# Patient Record
Sex: Male | Born: 1955
Health system: Southern US, Community
[De-identification: ages and names within clinical notes are randomized; demographics above are authoritative.]

## PROBLEM LIST (undated history)

## (undated) DIAGNOSIS — K219 Gastro-esophageal reflux disease without esophagitis: Secondary | ICD-10-CM

## (undated) DIAGNOSIS — I493 Ventricular premature depolarization: Secondary | ICD-10-CM

## (undated) DIAGNOSIS — K4090 Unilateral inguinal hernia, without obstruction or gangrene, not specified as recurrent: Secondary | ICD-10-CM

## (undated) DIAGNOSIS — K760 Fatty (change of) liver, not elsewhere classified: Secondary | ICD-10-CM

## (undated) DIAGNOSIS — K635 Polyp of colon: Secondary | ICD-10-CM

## (undated) DIAGNOSIS — G8929 Other chronic pain: Secondary | ICD-10-CM

## (undated) DIAGNOSIS — D126 Benign neoplasm of colon, unspecified: Secondary | ICD-10-CM

## (undated) DIAGNOSIS — R1031 Right lower quadrant pain: Secondary | ICD-10-CM

## (undated) DIAGNOSIS — F411 Generalized anxiety disorder: Secondary | ICD-10-CM

## (undated) DIAGNOSIS — R001 Bradycardia, unspecified: Secondary | ICD-10-CM

## (undated) DIAGNOSIS — I2510 Atherosclerotic heart disease of native coronary artery without angina pectoris: Secondary | ICD-10-CM

## (undated) DIAGNOSIS — E7800 Pure hypercholesterolemia, unspecified: Secondary | ICD-10-CM

## (undated) DIAGNOSIS — K740 Hepatic fibrosis: Secondary | ICD-10-CM

## (undated) DIAGNOSIS — I10 Essential (primary) hypertension: Secondary | ICD-10-CM

## (undated) DIAGNOSIS — K648 Other hemorrhoids: Secondary | ICD-10-CM

## (undated) HISTORY — PX: COLONOSCOPY: SHX174

## (undated) HISTORY — DX: Atherosclerotic heart disease of native coronary artery without angina pectoris: I25.10

## (undated) HISTORY — DX: Polyp of colon: K63.5

## (undated) HISTORY — DX: Other hemorrhoids: K64.8

## (undated) HISTORY — DX: Bradycardia, unspecified: R00.1

## (undated) HISTORY — DX: Pure hypercholesterolemia, unspecified: E78.00

## (undated) HISTORY — PX: TONSILLECTOMY: SUR1361

## (undated) HISTORY — DX: Unilateral inguinal hernia, without obstruction or gangrene, not specified as recurrent: K40.90

## (undated) HISTORY — DX: Gastro-esophageal reflux disease without esophagitis: K21.9

## (undated) HISTORY — DX: Ventricular premature depolarization: I49.3

## (undated) HISTORY — DX: Generalized anxiety disorder: F41.1

## (undated) HISTORY — DX: Hepatic fibrosis: K74.0

## (undated) HISTORY — DX: Essential (primary) hypertension: I10

## (undated) HISTORY — DX: Other chronic pain: G89.29

## (undated) HISTORY — DX: Fatty (change of) liver, not elsewhere classified: K76.0

## (undated) HISTORY — DX: Right lower quadrant pain: R10.31

## (undated) HISTORY — DX: Benign neoplasm of colon, unspecified: D12.6

---

## 2000-11-30 ENCOUNTER — Encounter: Admission: RE | Admit: 2000-11-30 | Discharge: 2000-11-30 | Payer: Self-pay

## 2000-11-30 ENCOUNTER — Encounter: Payer: Self-pay | Admitting: General Surgery

## 2004-09-19 ENCOUNTER — Ambulatory Visit: Payer: Self-pay | Admitting: Internal Medicine

## 2004-12-23 ENCOUNTER — Ambulatory Visit: Payer: Self-pay | Admitting: Internal Medicine

## 2004-12-24 ENCOUNTER — Ambulatory Visit: Payer: Self-pay | Admitting: Cardiology

## 2005-02-02 ENCOUNTER — Emergency Department (HOSPITAL_COMMUNITY): Admission: EM | Admit: 2005-02-02 | Discharge: 2005-02-02 | Payer: Self-pay | Admitting: *Deleted

## 2005-05-29 ENCOUNTER — Ambulatory Visit: Payer: Self-pay | Admitting: Emergency Medicine

## 2005-08-19 ENCOUNTER — Ambulatory Visit: Payer: Self-pay | Admitting: Internal Medicine

## 2005-12-08 ENCOUNTER — Ambulatory Visit: Payer: Self-pay | Admitting: Internal Medicine

## 2006-02-19 ENCOUNTER — Ambulatory Visit: Payer: Self-pay | Admitting: Gastroenterology

## 2006-03-11 ENCOUNTER — Ambulatory Visit: Payer: Self-pay | Admitting: Internal Medicine

## 2007-03-02 ENCOUNTER — Ambulatory Visit: Payer: Self-pay | Admitting: Gastroenterology

## 2007-05-14 ENCOUNTER — Encounter: Payer: Self-pay | Admitting: Internal Medicine

## 2007-05-30 ENCOUNTER — Telehealth (INDEPENDENT_AMBULATORY_CARE_PROVIDER_SITE_OTHER): Payer: Self-pay | Admitting: *Deleted

## 2007-06-09 ENCOUNTER — Encounter: Payer: Self-pay | Admitting: Internal Medicine

## 2007-06-09 DIAGNOSIS — J309 Allergic rhinitis, unspecified: Secondary | ICD-10-CM | POA: Insufficient documentation

## 2007-06-09 DIAGNOSIS — I10 Essential (primary) hypertension: Secondary | ICD-10-CM | POA: Insufficient documentation

## 2007-06-17 ENCOUNTER — Telehealth (INDEPENDENT_AMBULATORY_CARE_PROVIDER_SITE_OTHER): Payer: Self-pay | Admitting: *Deleted

## 2007-06-20 ENCOUNTER — Ambulatory Visit: Payer: Self-pay | Admitting: Internal Medicine

## 2007-07-05 ENCOUNTER — Telehealth (INDEPENDENT_AMBULATORY_CARE_PROVIDER_SITE_OTHER): Payer: Self-pay | Admitting: *Deleted

## 2007-08-23 ENCOUNTER — Telehealth (INDEPENDENT_AMBULATORY_CARE_PROVIDER_SITE_OTHER): Payer: Self-pay | Admitting: *Deleted

## 2007-08-26 ENCOUNTER — Telehealth (INDEPENDENT_AMBULATORY_CARE_PROVIDER_SITE_OTHER): Payer: Self-pay | Admitting: *Deleted

## 2007-09-05 ENCOUNTER — Ambulatory Visit: Payer: Self-pay | Admitting: Gastroenterology

## 2007-09-19 ENCOUNTER — Encounter: Payer: Self-pay | Admitting: Gastroenterology

## 2007-09-19 ENCOUNTER — Ambulatory Visit: Payer: Self-pay | Admitting: Gastroenterology

## 2007-09-21 ENCOUNTER — Encounter: Payer: Self-pay | Admitting: Gastroenterology

## 2007-09-23 ENCOUNTER — Ambulatory Visit: Payer: Self-pay | Admitting: Internal Medicine

## 2007-09-23 DIAGNOSIS — E7800 Pure hypercholesterolemia, unspecified: Secondary | ICD-10-CM | POA: Insufficient documentation

## 2007-09-23 DIAGNOSIS — J984 Other disorders of lung: Secondary | ICD-10-CM | POA: Insufficient documentation

## 2007-10-04 ENCOUNTER — Ambulatory Visit: Payer: Self-pay | Admitting: Internal Medicine

## 2007-10-04 ENCOUNTER — Telehealth (INDEPENDENT_AMBULATORY_CARE_PROVIDER_SITE_OTHER): Payer: Self-pay | Admitting: *Deleted

## 2008-06-07 ENCOUNTER — Telehealth (INDEPENDENT_AMBULATORY_CARE_PROVIDER_SITE_OTHER): Payer: Self-pay | Admitting: *Deleted

## 2008-09-14 ENCOUNTER — Telehealth (INDEPENDENT_AMBULATORY_CARE_PROVIDER_SITE_OTHER): Payer: Self-pay | Admitting: *Deleted

## 2008-12-03 ENCOUNTER — Telehealth (INDEPENDENT_AMBULATORY_CARE_PROVIDER_SITE_OTHER): Payer: Self-pay | Admitting: *Deleted

## 2009-01-16 ENCOUNTER — Telehealth (INDEPENDENT_AMBULATORY_CARE_PROVIDER_SITE_OTHER): Payer: Self-pay | Admitting: *Deleted

## 2009-04-30 ENCOUNTER — Ambulatory Visit: Payer: Self-pay | Admitting: Internal Medicine

## 2009-04-30 DIAGNOSIS — R61 Generalized hyperhidrosis: Secondary | ICD-10-CM | POA: Insufficient documentation

## 2009-04-30 LAB — CONVERTED CEMR LAB
ALT: 21 units/L (ref 0–53)
AST: 24 units/L (ref 0–37)
Albumin: 3.9 g/dL (ref 3.5–5.2)
Alkaline Phosphatase: 54 units/L (ref 39–117)
BUN: 14 mg/dL (ref 6–23)
Basophils Absolute: 0 10*3/uL (ref 0.0–0.1)
Basophils Relative: 0.6 % (ref 0.0–3.0)
Bilirubin Urine: NEGATIVE
Bilirubin, Direct: 0.1 mg/dL (ref 0.0–0.3)
CO2: 30 meq/L (ref 19–32)
CRP, High Sensitivity: 2.2 (ref 0.00–5.00)
Calcium: 9 mg/dL (ref 8.4–10.5)
Chloride: 105 meq/L (ref 96–112)
Cholesterol: 211 mg/dL — ABNORMAL HIGH (ref 0–200)
Creatinine, Ser: 1.1 mg/dL (ref 0.4–1.5)
Direct LDL: 147.5 mg/dL
Eosinophils Absolute: 0.3 10*3/uL (ref 0.0–0.7)
Eosinophils Relative: 5.5 % — ABNORMAL HIGH (ref 0.0–5.0)
GFR calc non Af Amer: 74.69 mL/min (ref >60)
Glucose, Bld: 100 mg/dL — ABNORMAL HIGH (ref 70–99)
HCT: 46.9 % (ref 39.0–52.0)
HDL: 53.1 mg/dL (ref >39.00)
Hemoglobin, Urine: NEGATIVE
Hemoglobin: 15.5 g/dL (ref 13.0–17.0)
Ketones, ur: NEGATIVE mg/dL
Leukocytes, UA: NEGATIVE
Lymphocytes Relative: 24.6 % (ref 12.0–46.0)
Lymphs Abs: 1.3 10*3/uL (ref 0.7–4.0)
MCHC: 33.1 g/dL (ref 30.0–36.0)
MCV: 96 fL (ref 78.0–100.0)
Monocytes Absolute: 0.5 10*3/uL (ref 0.1–1.0)
Monocytes Relative: 10.6 % (ref 3.0–12.0)
Neutro Abs: 3 10*3/uL (ref 1.4–7.7)
Neutrophils Relative %: 58.7 % (ref 43.0–77.0)
Nitrite: NEGATIVE
Platelets: 214 10*3/uL (ref 150.0–400.0)
Potassium: 4.5 meq/L (ref 3.5–5.1)
RBC: 4.89 M/uL (ref 4.22–5.81)
RDW: 12.4 % (ref 11.5–14.6)
Sodium: 142 meq/L (ref 135–145)
Specific Gravity, Urine: 1.01 (ref 1.000–1.030)
TSH: 1.41 microintl units/mL (ref 0.35–5.50)
Total Bilirubin: 1 mg/dL (ref 0.3–1.2)
Total CHOL/HDL Ratio: 4
Total Protein, Urine: NEGATIVE mg/dL
Total Protein: 6.9 g/dL (ref 6.0–8.3)
Triglycerides: 80 mg/dL (ref 0.0–149.0)
Urine Glucose: NEGATIVE mg/dL
Urobilinogen, UA: 0.2 (ref 0.0–1.0)
VLDL: 16 mg/dL (ref 0.0–40.0)
WBC: 5.1 10*3/uL (ref 4.5–10.5)
pH: 7 (ref 5.0–8.0)

## 2009-05-06 ENCOUNTER — Telehealth: Payer: Self-pay | Admitting: Pulmonary Disease

## 2009-05-09 ENCOUNTER — Emergency Department (HOSPITAL_BASED_OUTPATIENT_CLINIC_OR_DEPARTMENT_OTHER): Admission: EM | Admit: 2009-05-09 | Discharge: 2009-05-09 | Payer: Self-pay | Admitting: Emergency Medicine

## 2009-05-09 ENCOUNTER — Ambulatory Visit: Payer: Self-pay | Admitting: Radiology

## 2009-07-15 ENCOUNTER — Ambulatory Visit: Payer: Self-pay | Admitting: Internal Medicine

## 2009-07-26 ENCOUNTER — Ambulatory Visit: Payer: Self-pay | Admitting: Internal Medicine

## 2009-07-26 DIAGNOSIS — M25569 Pain in unspecified knee: Secondary | ICD-10-CM | POA: Insufficient documentation

## 2009-08-26 ENCOUNTER — Telehealth (INDEPENDENT_AMBULATORY_CARE_PROVIDER_SITE_OTHER): Payer: Self-pay | Admitting: *Deleted

## 2009-10-09 HISTORY — PX: KNEE ARTHROSCOPY: SUR90

## 2010-01-06 ENCOUNTER — Telehealth (INDEPENDENT_AMBULATORY_CARE_PROVIDER_SITE_OTHER): Payer: Self-pay | Admitting: *Deleted

## 2010-03-26 ENCOUNTER — Ambulatory Visit: Payer: Self-pay | Admitting: Internal Medicine

## 2010-05-02 ENCOUNTER — Encounter: Payer: Self-pay | Admitting: Internal Medicine

## 2010-05-02 ENCOUNTER — Ambulatory Visit: Payer: Self-pay | Admitting: Internal Medicine

## 2010-05-02 LAB — CONVERTED CEMR LAB
ALT: 17 units/L (ref 0–53)
AST: 19 units/L (ref 0–37)
Albumin: 3.7 g/dL (ref 3.5–5.2)
Alkaline Phosphatase: 71 units/L (ref 39–117)
Basophils Relative: 0.4 % (ref 0.0–3.0)
Bilirubin, Direct: 0.1 mg/dL (ref 0.0–0.3)
Blood, UA: NEGATIVE
CO2: 28 meq/L (ref 19–32)
Calcium: 9.2 mg/dL (ref 8.4–10.5)
Eosinophils Relative: 4.2 % (ref 0.0–5.0)
Hemoglobin: 16 g/dL (ref 13.0–17.0)
Ketones, ur: NEGATIVE mg/dL
Lymphocytes Relative: 22.5 % (ref 12.0–46.0)
MCHC: 34.6 g/dL (ref 30.0–36.0)
Monocytes Relative: 7.7 % (ref 3.0–12.0)
Neutro Abs: 4.8 10*3/uL (ref 1.4–7.7)
RBC: 5.02 M/uL (ref 4.22–5.81)
Sodium: 139 meq/L (ref 135–145)
Total CHOL/HDL Ratio: 4
Total Protein, Urine: NEGATIVE mg/dL
Total Protein: 6.8 g/dL (ref 6.0–8.3)
Triglycerides: 71 mg/dL (ref 0.0–149.0)
Urine Glucose: NEGATIVE mg/dL
Urobilinogen, UA: 0.2 (ref 0.0–1.0)
VLDL: 14.2 mg/dL (ref 0.0–40.0)
WBC: 7.3 10*3/uL (ref 4.5–10.5)

## 2010-05-09 ENCOUNTER — Telehealth: Payer: Self-pay | Admitting: Internal Medicine

## 2010-05-11 HISTORY — PX: KNEE ARTHROSCOPY: SUR90

## 2010-06-08 LAB — CONVERTED CEMR LAB
ALT: 25 units/L (ref 0–53)
AST: 25 units/L (ref 0–37)
Albumin: 3.9 g/dL (ref 3.5–5.2)
Alkaline Phosphatase: 77 units/L (ref 39–117)
BUN: 10 mg/dL (ref 6–23)
Basophils Absolute: 0 10*3/uL (ref 0.0–0.1)
Basophils Relative: 0.4 % (ref 0.0–1.0)
Bilirubin, Direct: 0.1 mg/dL (ref 0.0–0.3)
CO2: 32 meq/L (ref 19–32)
Calcium: 9.4 mg/dL (ref 8.4–10.5)
Chloride: 103 meq/L (ref 96–112)
Cholesterol: 220 mg/dL (ref 0–200)
Creatinine, Ser: 1 mg/dL (ref 0.4–1.5)
Direct LDL: 153.5 mg/dL
Eosinophils Absolute: 0.4 10*3/uL (ref 0.0–0.7)
Eosinophils Relative: 7 % — ABNORMAL HIGH (ref 0.0–5.0)
GFR calc Af Amer: 102 mL/min
GFR calc non Af Amer: 84 mL/min
Glucose, Bld: 100 mg/dL — ABNORMAL HIGH (ref 70–99)
HCT: 48.1 % (ref 39.0–52.0)
HDL: 47.2 mg/dL (ref >39.0)
Hemoglobin: 16.2 g/dL (ref 13.0–17.0)
Lymphocytes Relative: 25.8 % (ref 12.0–46.0)
MCHC: 33.7 g/dL (ref 30.0–36.0)
MCV: 92.3 fL (ref 78.0–100.0)
Monocytes Absolute: 0.9 10*3/uL (ref 0.1–1.0)
Monocytes Relative: 16.2 % — ABNORMAL HIGH (ref 3.0–12.0)
Neutro Abs: 2.6 10*3/uL (ref 1.4–7.7)
Neutrophils Relative %: 50.6 % (ref 43.0–77.0)
Platelets: 211 10*3/uL (ref 150–400)
Potassium: 4.4 meq/L (ref 3.5–5.1)
RBC: 5.2 M/uL (ref 4.22–5.81)
RDW: 12.5 % (ref 11.5–14.6)
Sodium: 140 meq/L (ref 135–145)
TSH: 1.97 microintl units/mL (ref 0.35–5.50)
Total Bilirubin: 0.8 mg/dL (ref 0.3–1.2)
Total CHOL/HDL Ratio: 4.7
Total Protein: 7.2 g/dL (ref 6.0–8.3)
Triglycerides: 102 mg/dL (ref 0–149)
VLDL: 20 mg/dL (ref 0–40)
WBC: 5.3 10*3/uL (ref 4.5–10.5)

## 2010-06-10 NOTE — Assessment & Plan Note (Signed)
Summary: Acute NP office visit - edema left knee   Primary Provider/Referring Provider:  Sherene Sires  CC:  c/o swelling in the back of his right knee w/ some pain x54months and also numbness in the bottom of his left foot x2weeks.  History of Present Illness: 55 yowm  with minimum smoking history and documented chronic rhinitis with allergies to dust and mold cats with a recurrent pattern of nonspecific rhinitis triggered by viral upper respiratory infections or airplane travel.  05-02-09 cpx cc bad breath no particular time of the day or night and no definite correlation with nasal symptoms which flare with colds/ travel.  Not compliant with nasal steroids.  in terms of his hypertension and hyperlipidemia, he denies any exertional chest pain orthopnea PND TIA or claudication symptoms or leg swelling  July 15, 2009 cc head and chest congestion x 2 weeks mucus is yellow,  rx with ns on his own. denies chronic symptoms but records indicate many phone calls and non-adherence with nasal steroids. no sinus pain.    July 26, 2009 --Presents for acute office visit. Complains of c/o swelling in the back of his right knee w/ some pain x13months and also numbness in the bottom of his left foot x2weeks. Travels alot for work. After sitting for prolonged periods during travel, happened for last year. Feels tight and stiff behind knee, at time sore to bend knee. Feels puffy behind knee. No calf pain or swelling, no known injury. Denies chest pain, dyspnea, orthopnea, hemoptysis, fever, n/v/d, edema, headache.   Preventive Screening-Counseling & Management  Alcohol-Tobacco     Smoking Status: quit  Medications Prior to Update: 1)  Atenolol 50 Mg Tabs (Atenolol) .Marland Kitchen.. 1 Once Daily 2)  Viagra 50 Mg  Tabs (Sildenafil Citrate) .... As Needed 3)  Drysol 20 % Soln (Aluminum Chloride) .... Apply Daily As Needed 4)  Prednisone 10 Mg  Tabs (Prednisone) .... 4 Each Am X 2days, 2x2days, 1x2days and Stop 5)   Zithromax Z-Pak 250 Mg Tabs (Azithromycin) .... Take As Directed  Current Medications (verified): 1)  Atenolol 50 Mg Tabs (Atenolol) .Marland Kitchen.. 1 Once Daily 2)  Viagra 50 Mg  Tabs (Sildenafil Citrate) .... As Needed 3)  Drysol 20 % Soln (Aluminum Chloride) .... Apply Daily As Needed  Allergies (verified): No Known Drug Allergies  Past History:  Past Medical History: Last updated: 07/15/2009  HYPERCHOLESTEROLEMIA (ICD-272.0)    - Target LDL < 130 (Type A plus male gender) ALLERGIC RHINITIS, CHRONIC (ICD-477.9)    - Chronic rhinitis flyers reviewed 05/27/99, 05/16/02, 12/23/04 and 03/11/06    - Pt denied ever receiving a chronic rhinitis flyer July 15, 2009  HYPERTENSION (ICD-401.9) Colon Polyps     - See Colonoscopy 09/19/2007 ................................[facility]......................Marland KitchenWert     - Td 09/2007     - Pneumovax 05/2003 (second shot)     Family History: Last updated: May 02, 2009 asthma in his father died in [place] at 14 negative for cancer mother healthy older brother by 11 years and healthy  Social History: Last updated: 07/26/2009 Former smoker.  Quit in 2000, socially.   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  Quit in 2000, socially.   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 No Comments Medications reviewed with patient Daytime contact number verified with patient. Boone Master CNA  July 26, 2009 9:49 AM    Physical Exam  Additional Exam:  robust  but anxious ambulatory white male in no acute distress  Afeb with normal vital signs  wt 188. 190 April 30, 2009 > 190 July 15, 2009 >>186 July 26, 2009 HEENT: nl dentition, and orophanx.Mod bilateral nonspecific turbinate edema. Nl external ear canals without cough reflex Neck without JVD/Nodes/TM nl carotids without bruit  Lungs clear to A and P bilaterally without cough on insp or exp maneuvers RRR no s3 or murmur or increase in P2 no displacement pmi Abd soft and benign with nl excursion in the supine position. No bruits or organomegaly, specifically no aortic enlargement Ext warm without calf tenderness, cyanosis clubbing or edema, nl pulses neg homans sign, along right post knee w/ tenderness, ROM nml, no redness or bruit appreciated, pulses bilaterally intact , skin smooth, and warm.    Impression & Recommendations:  Problem # 1:  KNEE PAIN (ICD-719.46)  Posterior knee pain right > left supsect DJD from previous extensive sports (he played college football), he travels quite abit w/ prolonged sitting. I do not appreciate any swelling, neg homans sign, no evidence at this time to check for DVT Could be baker's cyst. Will tx for DJD w/ NSAIDs along w/ ice /heat if not improving will refer to ortho.  Please contact office for sooner follow up if symptoms do not improve or worsen   Orders: Est. Patient Level III ([address]11)  Complete Medication List: 1)  Atenolol 50 Mg Tabs (Atenolol) .Marland Kitchen.. 1 once daily 2)  Viagra 50 Mg Tabs (Sildenafil citrate) .... As needed 3)  Drysol 20 % Soln (Aluminum chloride) .... Apply daily as needed  Patient Instructions: 1)  Alternate ice and heat to knees as needed  2)  Avoid prolonged sitting, increase exercise/walking as tolerated.  3)   Ibuprofen 200mg  3 tabs two times a day for 5 days, take w/ food.  4)  if not improving after 2 weeks will refer to ortho to evaluate.  5)  Please contact office for sooner follow up if symptoms do not improve or worsen    Immunization History:  Influenza Immunization History:    Influenza:  historical (05/11/2009)

## 2010-06-10 NOTE — Progress Notes (Signed)
Summary: rx  Phone Note Call from Patient Call back at [phone]   Caller: Patient Call For: wert Reason for Call: Refill Medication, Talk to Nurse Summary of Call: [phone] - [facility] in [place]  - Pt needs refill on BP Meds & Viagra - Pt going out of town today and needs to pick meds up before he leaves. Initial call taken by: Eugene Gavia,  January 06, 2010 9:09 AM  Follow-up for Phone Call        Rxs were refilled- [facility] for pt to be made aware this was done. Follow-up by: Vernie Murders,  January 06, 2010 10:11 AM    Prescriptions: VIAGRA 50 MG  TABS (SILDENAFIL CITRATE) as needed  #10 x 3   Entered by:   Vernie Murders   Authorized by:   Nyoka Cowden MD   Signed by:   Vernie Murders on 01/06/2010   Method used:   Electronically to        [facility] [phone]* (retail)       [address]       [place], [place]  [address]       Ph: [phone]       Fax: [phone]   RxID:   [id number] ATENOLOL 50 MG TABS (ATENOLOL) 1 once daily  #30 x 3   Entered by:   Vernie Murders   Authorized by:   Nyoka Cowden MD   Signed by:   Vernie Murders on 01/06/2010   Method used:   Electronically to        [facility] [phone]* (retail)       [address]       [place], [place]  [address]       Ph: [phone]       Fax: [phone]   RxID:   [id number]

## 2010-06-10 NOTE — Assessment & Plan Note (Signed)
Summary: Primary svc/ acute ext ov re rx of Chronic Rhinitis   Primary Provider/Referring Provider:  Sherene Sires  CC:  Acute visit.  Marland Kitchen  History of Present Illness: 7 yowm  with minimum smoking history and documented chronic rhinitis with allergies to dust and mold cats with a recurrent pattern of nonspecific rhinitis triggered by viral upper respiratory infections or airplane travel.  April 30, 2009 cpx cc bad breath no particular time of the day or night and no definite correlation with nasal symptoms which flare with colds/ travel.  Not compliant with nasal steroids.  in terms of his hypertension and hyperlipidemia, he denies any exertional chest pain orthopnea PND TIA or claudication symptoms or leg swelling  July 15, 2009 cc head and chest congestion x 2 weeks mucus is yellow,  rx with ns on his own. denies chronic symptoms but records indicate many phone calls and non-adherence with nasal steroids. no sinus pain.  Pt denies any significant sore throat, dysphagia, itching, sneezing,  n  fever, chills, sweats, unintended wt loss, pleuritic or exertional cp, hempoptysis, change in activity tolerance  orthopnea pnd or leg swelling.  Pt also denies any obvious fluctuation in symptoms with weather or environmental change or other alleviating or aggravating factors but thinks the freq air travel may be the problem.     Current Medications (verified): 1)  Atenolol 50 Mg Tabs (Atenolol) .Marland Kitchen.. 1 Once Daily 2)  Viagra 50 Mg  Tabs (Sildenafil Citrate) .... As Needed 3)  Drysol 20 % Soln (Aluminum Chloride) .... Apply Daily As Needed  Allergies (verified): No Known Drug Allergies  Past History:  Past Medical History:  HYPERCHOLESTEROLEMIA (ICD-272.0)    - Target LDL < 130 (Type A plus male gender) ALLERGIC RHINITIS, CHRONIC (ICD-477.9)    - Chronic rhinitis flyers reviewed 05/27/99, 05/16/02, 12/23/04 and 03/11/06    - Pt denied ever receiving a chronic rhinitis flyer July 15, 2009  HYPERTENSION  (ICD-401.9) Colon Polyps     - See Colonoscopy 09/19/2007 ................................[facility]......................................................Marland KitchenWert     - Td 09/2007     - Pneumovax 05/2003 (second shot)     Vital Signs:  Patient profile:   55 year old male Weight:      190 pounds O2 Sat:      98 % on Room air Pulse rate:   67 / minute BP sitting:   118 / 80  (left arm)  Vitals Entered By: Vernie Murders (July 15, 2009 11:15 AM)  O2 Flow:  Room air  Physical Exam  Additional Exam:  robust  but anxious ambulatory white male in no acute distress and spent the entire interview with my nurse on his cell phone. Afeb with normal vital signs  wt 188. 190 April 30, 2009 > 190 July 15, 2009  HEENT: nl dentition, and orophanx.Mod bilateral nonspecific turbinate edema. Nl external ear canals without cough reflex Neck without JVD/Nodes/TM nl carotids without bruit  Lungs clear to A and P bilaterally without cough on insp or exp maneuvers RRR no s3 or murmur or increase in P2 no displacement pmi Abd soft and benign with nl excursion in the supine position. No bruits or organomegaly, specifically no aortic enlargement Ext warm without calf tenderness, cyanosis clubbing or edema, nl pulses    Impression & Recommendations:  Problem # 1:  ALLERGIC RHINITIS, CHRONIC (ICD-477.9)  Multiple ov and phone calls strongly suggest this is more of a chronic/recurrent issue than an intermittent one.  I had  an extended discussion with the patient today lasting 15 to 20 minutes of a 25 minute visit on the following issues:   He did not recognize the CRF he has received now 4 times so I did not give him another.  next flare consider a sinus ct then either ent or allergy referral.  In meantime can use advil cold and sinus as needed as long as not using daily.   Each maintenance medication was reviewed in detail including most importantly the difference between maintenance and  as needed and under what circumstances the prns are to be used. See instructions for specific recommendations   Orders: Est. Patient Level IV ([address])  Problem # 2:  HYPERTENSION (ICD-401.9) ok despite overuse of decongestants  His updated medication list for this problem includes:    Atenolol 50 Mg Tabs (Atenolol) .Marland Kitchen... 1 once daily  Medications Added to Medication List This Visit: 1)  Prednisone 10 Mg Tabs (Prednisone) .... 4 each am x 2days, 2x2days, 1x2days and stop 2)  Zithromax Z-pak 250 Mg Tabs (Azithromycin) .... Take as directed  Patient Instructions: 1)  Prednisone 10 mg 4 each am x 2days, 2x2days, 1x2days and stop 2)  Zpak 3)  Nasal Saline and advil cold and sinus are good short term will not control underlying inflammation so if this becomes chronic or frequent you'll a new maintenance regimen Prescriptions: ZITHROMAX Z-PAK 250 MG TABS (AZITHROMYCIN) take as directed  #1 x 0   Entered and Authorized by:   Nyoka Cowden MD   Signed by:   Abigail Miyamoto RN on 07/15/2009   Method used:   [facility]        [facility] [phone]* (retail)       [address]       [place], [place]  [address]       Ph: [phone]       Fax: [phone]   RxID:   [id number] PREDNISONE 10 MG  TABS (PREDNISONE) 4 each am x 2days, 2x2days, 1x2days and stop  #14 x 0   Entered and Authorized by:   Nyoka Cowden MD   Signed by:   Abigail Miyamoto RN on 07/15/2009   Method used:   [facility]ically to        [facility] [phone]* (retail)       [address]       [place], [place]  [address]       Ph: [phone]       Fax: [phone]   RxID:   [id number]

## 2010-06-10 NOTE — Progress Notes (Signed)
Summary: Wants referral  Phone Note Call from Patient Call back at [phone]   Caller: Patient Summary of Call: Saw Tammy Parrett on 3/18 for R knee--still stiff and achy.  Patient is limping.  Wants referral to specialist. Initial call taken by: Leonette Monarch,  August 26, 2009 8:54 AM  Follow-up for Phone Call        refer to ortho for knee pain Follow-up by: Rubye Oaks NP,  August 26, 2009 9:15 AM  Additional Follow-up for Phone Call Additional follow up Details #1::        The pt is aware and would like something this week on either Wed or Thurs, if possible. Will forward to the PCC's.Michel Bickers [facility]  August 26, 2009 9:25 AM    Additional Follow-up for Phone Call Additional follow up Details #2::    pt has appt to see the PA for dr [email or web address]  [facility]o and he is aware of this appt  Follow-up by: Oneita Jolly,  August 26, 2009 9:41 AM

## 2010-06-10 NOTE — Assessment & Plan Note (Signed)
Summary: Primary svc/ acute flare of chronic rhinitis   Primary Provider/Referring Provider:  Sherene Sires  CC:  Sinus pressure and aches..  History of Present Illness: 15 yowm  with minimum smoking history and documented chronic rhinitis with allergies to dust and mold cats with a recurrent pattern of nonspecific rhinitis triggered by viral upper respiratory infections or airplane travel.  April 30, 2009 cpx cc bad breath no particular time of the day or night and no definite correlation with nasal symptoms which flare with colds/ travel.  Not compliant with nasal steroids.  in terms of his hypertension and hyperlipidemia, he denies any exertional chest pain orthopnea PND TIA or claudication symptoms or leg swelling  July 15, 2009 cc head and chest congestion x 2 weeks mucus is yellow,  rx with ns on his own. denies chronic symptoms but records indicate many phone calls and non-adherence with nasal steroids. no sinus pain.    March 26, 2010 ov Sinus pressure and aches.x 2 days with sore throat, fever aches whihile in [facility] > flew to [place]. no sob or purulent nasal secretions. not compliant with nasal steroids. Pt denies any significant   dysphagia, itching, sneezing,  rigors,  sweats, unintended wt loss, pleuritic or exertional cp, hempoptysis, change in activity tolerance  orthopnea pnd or leg swelling.  Allergies: No Known Drug Allergies  Past History:  Past Medical History:  HYPERCHOLESTEROLEMIA (ICD-272.0)    - Target LDL < 130 (Type A plus male gender) ALLERGIC RHINITIS, CHRONIC (ICD-477.9)    - Chronic rhinitis flyers reviewed 05/27/99, 05/16/02, 12/23/04 and 03/11/06    - Pt denied ever receiving a chronic rhinitis flyer July 15, 2009, not using it March 26, 2010   HYPERTENSION (ICD-401.9) Colon Polyps     - See Colonoscopy 09/19/2007 ................................[facility]......................Marland KitchenWert     - Td 09/2007     -  Pneumovax 05/2003 (second shot)     Past Surgical History: Arthrosocpy R Kneee June 2011..........................Marland KitchenNorris  Vital Signs:  Patient profile:   55 year old male Weight:      187 pounds O2 Sat:      98 % on Room air Temp:     100.0 degrees F oral Pulse rate:   85 / minute BP sitting:   150 / 82  (left arm)  Vitals Entered By: Vernie Murders (March 26, 2010 3:13 PM)  O2 Flow:  Room air  Physical Exam  Additional Exam:  robust  but anxious ambulatory white male in no acute distress   wt 188. 190 April 30, 2009 > 190 July 15, 2009 >>186 July 26, 2009 > 187 March 26, 2010  HEENT: nl dentition, and orophanx.Mod bilateral nonspecific turbinate edema. Nl external ear canals without cough reflex Neck without JVD/Nodes/TM nl carotids without bruit  Lungs clear to A and P bilaterally without cough on insp or exp maneuvers RRR no s3 or murmur or increase in P2 no displacement pmi Abd soft and benign with nl excursion in the supine position. No bruits or organomegaly, specifically no aortic enlargement Ext warm without calf tenderness, cyanosis clubbing or edema, nl pulses     Impression & Recommendations:  Problem # 1:  ALLERGIC RHINITIS, CHRONIC (ICD-477.9) Multiple ov and phone calls strongly suggest this is more of a chronic/recurrent issue than an intermittent one.  He did not recognize the CRF he has received now 4 times on previous of so  I did not give him another.  next flare consider a  sinus ct then either ent or allergy referral.  In meantime can use advil cold and sinus as needed as long as not using daily.   Each maintenance medication was reviewed in detail including most importantly the difference between maintenance and as needed and under what circumstances the prns are to be used. See instructions for specific recommendations      Medications Added to Medication List This Visit: 1)  Nasonex 50 Mcg/act Susp (Mometasone furoate) .... Two puffs each  nostril daily 2)  Doxycycline Hyclate 100 Mg Tabs (Doxycycline hyclate) .... One twice daily with glass of water befoer eating  Other Orders: Est. Patient Level III ([address])  Patient Instructions: 1)  I emphasized that nasal steroids (nasonex)  have no immediate benefit in terms of improving symptoms.  To help them reached the target tissue, the patient should use Afrin two puffs every 12 hours applied one min before using the nasal steroids.  Afrin should be stopped after no more than 5 days.  If the symptoms worsen, Afrin can be restarted after 5 days off of therapy to prevent rebound congestion from overuse of Afrin.  I also emphasized that in no way are nasal steroids a concern in terms of "addiction". 2)  For aches pains and stuffiness use advil cold and sinus as needed 3)  doxycycline 100mg  twice daily x 7 days 4)    Prescriptions: DOXYCYCLINE HYCLATE 100 MG TABS (DOXYCYCLINE HYCLATE) one twice daily with glass of water befoer eating  #14 x 0   Entered and Authorized by:   Nyoka Cowden MD   Signed by:   Nyoka Cowden MD on 03/26/2010   Method used:   [facility] to        [facility] [phone]* (retail)       [address]       [place], [place]  [address]       Ph: [phone]       Fax: [phone]   RxID:   [id number] NASONEX 50 MCG/ACT  SUSP (MOMETASONE FUROATE) Two puffs each nostril daily  #1 x 11   Entered and Authorized by:   Nyoka Cowden MD   Signed by:   Nyoka Cowden MD on 03/26/2010   Method used:   Electronically to        [facility] [phone]* (retail)       [address]       [place], [place]  [address]       Ph: [phone]       Fax: [phone]   RxID:   [id number]

## 2010-06-12 NOTE — Assessment & Plan Note (Signed)
Summary: Primary Svc/ cpx   Primary Provider/Referring Provider:  Sherene Sires  CC:  cpx fasting.  History of Present Illness: 55 yowm  with minimum smoking history and documented chronic rhinitis with allergies to dust and mold cats with a recurrent pattern of nonspecific rhinitis triggered by viral upper respiratory infections or airplane travel not compliant with nasal steroids.   April 30, 2009 cpx cc bad breath no particular time of the day or night and no definite correlation with nasal symptoms which flare with colds/ travel.  Not compliant with nasal steroids.  in terms of his hypertension and hyperlipidemia, he denies any exertional chest pain orthopnea PND TIA or claudication symptoms or leg swelling  July 15, 2009 cc head and chest congestion x 2 weeks mucus is yellow,  rx with ns on his own. denies chronic symptoms but records indicate many phone calls and non-adherence with nasal steroids. no sinus pain.    March 26, 2010 ov Sinus pressure and aches.x 2 days with sore throat, fever aches while  in [employer] [place] > flew to [place] [place].  I emphasized that nasal steroids (nasonex)  have no immediate benefit in terms of improving symptoms.   For aches pains and stuffiness use advil cold and sinus as needed doxycycline 100mg  twice daily x 7 days  May 02, 2010 cpx cc pnds, again non compliant with nasal steroids. Pt denies any significant sore throat, dysphagia, itching, sneezing,  nasal congestion or excess secretions,  fever, chills, sweats, unintended wt loss, pleuritic or exertional cp, hempoptysis, change in activity tolerance  orthopnea pnd or leg swelling.   Current Medications (verified): 1)  Atenolol 50 Mg Tabs (Atenolol) .Marland Kitchen.. 1 Once Daily 2)  Viagra 50 Mg  Tabs (Sildenafil Citrate) .... As Needed 3)  Drysol 20 % Soln (Aluminum Chloride) .... Apply Daily As Needed 4)  Nasonex 50 Mcg/act  Susp (Mometasone Furoate) .... Two Puffs Each Nostril Daily As Needed  Allergies  (verified): No Known Drug Allergies  Past History:  Past Medical History:  HYPERCHOLESTEROLEMIA (ICD-272.0)    - Target LDL < 130 (Type A plus male gender) ALLERGIC RHINITIS, CHRONIC (ICD-477.9)    - Chronic rhinitis flyers reviewed 05/27/99, 05/16/02, 12/23/04 and 03/11/06    - Pt denied ever receiving a chronic rhinitis flyer July 15, 2009, not using it March 26, 2010   HYPERTENSION (ICD-401.9) Colon Polyps     - See Colonoscopy 09/19/2007 ................................[facility]......................Marland KitchenWert     - Td 09/2007     - Pneumovax 05/2003 (second shot)      - CPX  May 02, 2010   Family History: Reviewed history from 04/30/2009 and no changes required. asthma in his father died in [place] at 80 negative for cancer mother healthy older brother by 11 years and healthy  Social History: Reviewed history from 07/26/2009 and no changes required. Former smoker.  Quit in 2000, socially.   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 Nl external ear canals without cough reflex Neck without JVD/Nodes/TM nl carotids without bruit  Lungs clear to A and P bilaterally without cough on insp or exp maneuvers RRR no s3 or murmur or increase in P2 no displacement pmi Abd soft and benign with nl excursion in the supine position. No bruits or organomegaly,  specifically no aortic enlargement Ext warm without calf tenderness, cyanosis clubbing or edema, nl pulses Neuro alert, no deficits MS nl gait, no restrictions Skin no lesions GU  testes down bilaterally no IH Rectal: smooth prostate, no nodules, stool g neg      Cholesterol          [H]  221 mg/dL                   2-725     ATP III Classification            Desirable:  < 200 mg/dL                    Borderline High:  200 - 239 mg/dL               High:  > = 240 mg/dL   Triglycerides             71.0 mg/dL                  3.6-644.0     Normal:  <150 mg/dL     Borderline High:  347 - 199 mg/dL   HDL                       42.59 mg/dL                 >56.38   VLDL Cholesterol          14.2 mg/dL                  7.5-64.3  CHO/HDL Ratio:  CHD Risk                             4                    Men          Women     1/2 Average Risk     3.4          3.3     Average Risk          5.0          4.4     2X Average Risk          9.6          7.1     3X Average Risk          15.0          11.0                           Tests: (2) BMP (METABOL)   Sodium                    139 mEq/L  135-145   Potassium                 4.7 mEq/L                   3.5-5.1   Chloride                  105 mEq/L                   96-112   Carbon Dioxide            28 mEq/L                    19-32   Glucose                   95 mg/dL                    04-54   BUN                       17 mg/dL                    0-98   Creatinine                1.2 mg/dL                   1.1-9.1   Calcium                   9.2 mg/dL                   4.7-82.9   GFR                       66.65 mL/min                >60.00  Tests: (3) CBC Platelet w/Diff (CBCD)   White Cell Count          7.3 K/uL                    4.5-10.5   Red Cell Count            5.02 Mil/uL                 4.22-5.81   Hemoglobin                16.0 g/dL                   56.2-13.0   Hematocrit                46.3 %                       39.0-52.0   MCV                       92.4 fl                     78.0-100.0   MCHC                      34.6 g/dL                   86.5-78.4   RDW  12.8 %                      11.5-14.6   Platelet Count            252.0 K/uL                  150.0-400.0   Neutrophil %              65.2 %                      43.0-77.0   Lymphocyte %              22.5 %                      12.0-46.0   Monocyte %                7.7 %                       3.0-12.0   Eosinophils%              4.2 %                       0.0-5.0   Basophils %               0.4 %                       0.0-3.0   Neutrophill Absolute      4.8 K/uL                    1.4-7.7   Lymphocyte Absolute       1.7 K/uL                    0.7-4.0   Monocyte Absolute         0.6 K/uL                    0.1-1.0  Eosinophils, Absolute                             0.3 K/uL                    0.0-0.7   Basophils Absolute        0.0 K/uL                    0.0-0.1  Tests: (4) Hepatic/Liver Function Panel (HEPATIC)   Total Bilirubin           0.9 mg/dL                   4.6-9.6   Direct Bilirubin          0.1 mg/dL                   2.9-5.2   Alkaline Phosphatase      71 U/L                      39-117   AST                       19 U/L  0-37   ALT                       17 U/L                      0-53   Total Protein             6.8 g/dL                    1.1-9.1   Albumin                   3.7 g/dL                    4.7-8.2  Tests: (5) TSH (TSH)   FastTSH                   1.17 uIU/mL                 0.35-5.50  Tests: (6) UDip Only (UDIP)   Color                     YELLOW       RANGE:  Yellow;Lt. Yellow   Clarity                   CLEAR                       Clear   Specific Gravity          1.015                       1.000 - 1.030   Urine Ph                  7.0                         5.0-8.0   Protein                   NEGATIVE                    Negative   Urine Glucose              NEGATIVE                    Negative   Ketones                   NEGATIVE                    Negative   Urine Bilirubin           NEGATIVE                    Negative   Blood                     NEGATIVE                    Negative   Urobilinogen              0.2                         0.0 - 1.0   Leukocyte Esterace  NEGATIVE                    Negative   Nitrite                   NEGATIVE                    Negative  Tests: (7) Cholesterol LDL - Direct (DIRLDL)  Cholesterol LDL - Direct                             163.6 mg/dL  Impression & Recommendations:  Problem # 1:  ALLERGIC RHINITIS, CHRONIC (ICD-477.9)  His updated medication list for this problem includes:    Nasonex 50 Mcg/act Susp (Mometasone furoate) .Marland Kitchen... 2 every 12 hours taper to one at bedtime  exhaustive discussion again using the cream to the rash analogy.  Says did not know that nasonex = nasal prednsione.. See instructions for specific recommendations   Problem # 2:  HYPERCHOLESTEROLEMIA (ICD-272.0)    - Target LDL < 130 (Type A plus male gender)  Labs Reviewed: SGOT: 24 (04/30/2009)   SGPT: 21 (04/30/2009)   HDL:53.10 (04/30/2009), 47.2 (09/23/2007)  LDL:DEL (09/23/2007)  147 > 163 May 02, 2010 so work harder on diet/ ex  Chol:211 (04/30/2009), 220 (09/23/2007)  Trig:80.0 (04/30/2009), 102 (09/23/2007)  Medications Added to Medication List This Visit: 1)  Nasonex 50 Mcg/act Susp (Mometasone furoate) .... Two puffs each nostril daily as needed 2)  Nasonex 50 Mcg/act Susp (Mometasone furoate) .... 2 every 12 hours taper to one at bedtime  Other Orders: EKG w/ Interpretation (93000) TLB-Lipid Panel (80061-LIPID) TLB-BMP (Basic Metabolic Panel-BMET) (80048-METABOL) TLB-CBC Platelet - w/Differential (85025-CBCD) TLB-Hepatic/Liver Function Pnl (80076-HEPATIC) TLB-TSH (Thyroid Stimulating Hormone) (84443-TSH) TLB-Udip ONLY (81003-UDIP) Est. Patient 40-64 years (04540)  Patient Instructions: 1)   Nasal steroids (nasonex = nasal prednisone_ have no immediate benefit in terms of improving symptoms.  To help them reached the target tissue, the patient should use Afrin two puffs every 12 hours applied one min before using the nasal steroids.  Afrin should be stopped after no more than 5 days.  If the symptoms worsen, Afrin can be restarted after 5 days off of therapy to prevent rebound congestion from overuse of Afrin.  I also emphasized that in no way are nasal steroids a concern in terms of "addiction" and should be tapered down to one puff at bedtime BUT NOT STOPPED 2)  If not better, next step is sinus ct 3)  Exercise rec is 30 min 3 x weekly to level of short of breath, never out of breath 4)  Call [phone] for your results w/in next 3 days - if there's something important  I feel you need to know,  I'll be in touch with you directly.  5)

## 2010-06-12 NOTE — Progress Notes (Signed)
Summary: Test results  Phone Note Call from Patient Call back at [phone]   Caller: Patient Summary of Call: Patient had physical and has questions about lab results--what is acceptable range of cholesterol, why 163 is bad, etc.   Initial call taken by: Leonette Monarch,  May 09, 2010 8:46 AM  Follow-up for Phone Call        Pt has question about his labs and wanted to know his cholesterol ranges and what they meant. I explained this to the pt and gave him the ranges for his test.  Pt has no further question. Carron Curie CMA  May 09, 2010 9:20 AM

## 2010-07-07 ENCOUNTER — Encounter: Payer: Self-pay | Admitting: Adult Health

## 2010-07-07 ENCOUNTER — Ambulatory Visit (INDEPENDENT_AMBULATORY_CARE_PROVIDER_SITE_OTHER): Payer: 59 | Admitting: Adult Health

## 2010-07-07 ENCOUNTER — Telehealth (INDEPENDENT_AMBULATORY_CARE_PROVIDER_SITE_OTHER): Payer: Self-pay | Admitting: *Deleted

## 2010-07-07 DIAGNOSIS — J069 Acute upper respiratory infection, unspecified: Secondary | ICD-10-CM

## 2010-07-11 ENCOUNTER — Telehealth: Payer: Self-pay | Admitting: Adult Health

## 2010-07-17 NOTE — Progress Notes (Signed)
Summary: quantity on rx  Phone Note From Pharmacy Call back at [phone]   Caller: [facility] [address] #[id number]* Call For: parrett  Summary of Call: Need a quantity for hydromet pt is there now. Initial call taken by: Darletta Moll,  July 07, 2010 1:01 PM  Follow-up for Phone Call        qty in 6 ounces, [facility] aware Follow-up by: Philipp Deputy CMA,  July 07, 2010 1:21 PM

## 2010-07-17 NOTE — Assessment & Plan Note (Signed)
Summary: OV COUGHING, WHEEZING//SH   Visit Type:  Acute NP visit Primary Provider/Referring Provider:  Sherene Sires  CC:  Pt c/o wheezing worse when laying down, chest tightness, chest and head congestion, productive cough with gray to yellow mucus, chills x 5 days. Pt denies fever, bodyaches, n/v/d, sweats, SOB, and rash. Taking Mucinex and Robitussin DM and other OTC meds. Will be flying out in the AM to [place].  History of Present Illness: 16 yowm  with minimum smoking history and documented chronic rhinitis with allergies to dust and mold cats with a recurrent pattern of nonspecific rhinitis triggered by viral upper respiratory infections or airplane travel not compliant with nasal steroids.   April 30, 2009 cpx cc bad breath no particular time of the day or night and no definite correlation with nasal symptoms which flare with colds/ travel.  Not compliant with nasal steroids.  in terms of his hypertension and hyperlipidemia, he denies any exertional chest pain orthopnea PND TIA or claudication symptoms or leg swelling  July 15, 2009 cc head and chest congestion x 2 weeks mucus is yellow,  rx with ns on his own. denies chronic symptoms but records indicate many phone calls and non-adherence with nasal steroids. no sinus pain.    March 26, 2010 ov Sinus pressure and aches.x 2 days with sore throat, fever aches while  in [employer] > flew to [place].  I emphasized that nasal steroids (nasonex)  have no immediate benefit in terms of improving symptoms.   For aches pains and stuffiness use advil cold and sinus as needed doxycycline 100mg  twice daily x 7 days  May 02, 2010 cpx cc pnds, again non compliant with nasal steroids. Pt denies any significant sore throat, dysphagia, itching, sneezing,  nasal congestion or excess secretions,  fever, chills, sweats, unintended wt loss, pleuritic or exertional cp, hempoptysis, change in activity tolerance  orthopnea pnd or leg swelling.  July 07, 2010 --Presents for an acute office visit. Complains of wheezing worse when laying down, chest tightness, chest and head congestion, productive cough with gray to yellow mucus, chills x 5 days.   Taking Mucinex and Robitussin DM and other OTC meds. Will be flying out in the AM to [place]. Was around coworker recently with  a cold. Denies chest pain, dyspnea, orthopnea, hemoptysis, fever, n/v/d, edema, headache.    Preventive Screening-Counseling & Management  Alcohol-Tobacco     Smoking Status: quit  Medications Prior to Update: 1)  Atenolol 50 Mg Tabs (Atenolol) .Marland Kitchen.. 1 Once Daily 2)  Viagra 50 Mg  Tabs (Sildenafil Citrate) .... As Needed 3)  Drysol 20 % Soln (Aluminum Chloride) .... Apply Daily As Needed 4)  Nasonex 50 Mcg/act  Susp (Mometasone Furoate) .... 2 Every 12 Hours Taper To One At Bedtime  Current Medications (verified): 1)  Atenolol 50 Mg Tabs (Atenolol) .Marland Kitchen.. 1 Once Daily 2)  Viagra 50 Mg  Tabs (Sildenafil Citrate) .... As Needed 3)  Drysol 20 % Soln (Aluminum Chloride) .... Apply Daily As Needed 4)  Nasonex 50 Mcg/act  Susp (Mometasone Furoate) .... 2 Every 12 Hours Taper To One At Bedtime  Allergies (verified): No Known Drug Allergies  Past History:  Past Medical History: Last updated: 05/02/2010  HYPERCHOLESTEROLEMIA (ICD-272.0)    - Target LDL < 130 (Type A plus male gender) ALLERGIC RHINITIS, CHRONIC (ICD-477.9)    - Chronic rhinitis flyers reviewed 05/27/99, 05/16/02, 12/23/04 and 03/11/06    - Pt denied ever receiving a chronic rhinitis flyer July 15, 2009, not using it March 26, 2010   HYPERTENSION (ICD-401.9) Colon Polyps     - See Colonoscopy 09/19/2007 ................................[facility]......................Marland KitchenWert     - Td 09/2007     - Pneumovax 05/2003 (second shot)      - CPX  May 02, 2010   Past Surgical History: Last updated: 03/26/2010 Arthrosocpy R [place] June  2011..........................Marland KitchenNorris  Family History: Last updated: 05-23-2009 asthma in his father died in [place] at 100 negative for cancer mother healthy older brother by 11 years and healthy  Social History: Last updated: 07/26/2009 Former smoker.  Quit in 2000, socially.   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, freq flyer ETOH- daily, wine  Risk Factors: Smoking Status: quit (07/07/2010)  Review of Systems      See HPI  Vital Signs:  Patient profile:   55 year old male Height:      70 inches Weight:      185 pounds BMI:     26.64 O2 Sat:      96 % on Room air Temp:     97.5 degrees F oral Pulse rate:   66 / minute BP sitting:   126 / 90  (right arm) Cuff size:   regular  Vitals Entered By: Zackery Barefoot CMA (July 07, 2010 11:41 AM)  O2 Flow:  Room air CC: Pt c/o wheezing worse when laying down, chest tightness, chest and head congestion, productive cough with gray to yellow mucus, chills x 5 days. Pt denies fever, bodyaches, n/v/d, sweats, SOB, rash. Taking Mucinex and Robitussin DM and other OTC meds. Will be flying out in the AM to [place] Comments Medications reviewed with patient Verified contact number and pharmacy with patient Zackery Barefoot CMA  July 07, 2010 11:44 AM    Physical Exam  Additional Exam:  robust  but anxious ambulatory white male in no acute distress  HEENT: nl dentition, and orophanx.Mild bilateral nonspecific turbinate edema. Nl external ear canals without cough reflex Neck without JVD/Nodes/TM nl carotids without bruit  Lungs clear to A and P bilaterally without cough on insp or exp maneuvers RRR no s3 or murmur or increase in P2 no displacement pmi Abd soft and benign with nl excursion in the supine position. No bruits or organomegaly, specifically no aortic enlargement Ext warm without calf tenderness, cyanosis clubbing or edema, nl pulses Neuro alert, no deficits Skin no lesions    Impression & Recommendations:  Problem # 1:   UPPER RESPIRATORY INFECTION, ACUTE (ICD-465.9)  Zpack take as directed . Mucinex DM two times a day as needed cough/congestion  Saline nasal rinses as needed  Afrin 2 puffs two times a day x 5 days Fluids and rest  Hydromet 1-2 tsp every 4-6 hr as needed cough ,may make you sleepy.  Please contact office for sooner follow up if symptoms do not improve or worsen  His updated medication list for this problem includes:    Hydromet 5-1.5 Mg/65ml Syrp (Hydrocodone-homatropine) .Marland Kitchen... 1-2 tsp every 4-6 hrs as needed cough , may make you sleepy  Orders: Est. Patient Level III (04540)  Medications Added to Medication List This Visit: 1)  Zithromax Z-pak 250 Mg Tabs (Azithromycin) .... Take as directed 2)  Hydromet 5-1.5 Mg/24ml Syrp (Hydrocodone-homatropine) .Marland Kitchen.. 1-2 tsp every 4-6 hrs as needed cough , may make you sleepy  Patient Instructions: 1)  Zpack take as directed . 2)  Mucinex DM two times a day as needed cough/congestion  3)  Saline nasal rinses as needed  4)  Afrin 2 puffs two times a day x 5 days 5)  Fluids and rest  6)  Hydromet 1-2 tsp every 4-6 hr as needed cough ,may make you sleepy.  7)  Please contact office for sooner follow up if symptoms do not improve or worsen  Prescriptions: HYDROMET 5-1.5 MG/5ML SYRP (HYDROCODONE-HOMATROPINE) 1-2 tsp every 4-6 hrs as needed cough , may make you sleepy  #1 x 0   Entered and Authorized by:   Rubye Oaks NP   Signed by:   Tammy Parrett NP on 07/07/2010   Method used:   Print then Give to Patient   RxID:   [id number] ZITHROMAX Z-PAK 250 MG TABS (AZITHROMYCIN) take as directed  #1 x 0   Entered and Authorized by:   Rubye Oaks NP   Signed by:   Tammy Parrett NP on 07/07/2010   Method used:   Electronically to        [id number]* (retail)       [address]       [place], [place]  [address]       Ph: [phone]       Fax: [phone]   RxID:    [id number]    Immunization History:  Influenza Immunization History:    Influenza:  historical (03/11/2010)

## 2010-07-17 NOTE — Progress Notes (Signed)
Summary: chest congestion-  Phone Note Call from Patient Call back at [phone]   Caller: Patient Call For: parrett Summary of Call: Pt states he saw TP on 2/27 and his chest congestion is no better, currently taking mucinex, zpak, and cough syrup pls advise.//[facility] [address] Initial call taken by: Darletta Moll,  July 11, 2010 10:48 AM  Follow-up for Phone Call        [email or web address].  Carron Curie CMA  July 11, 2010 11:34 AM  spoke with pt and he states that he finished abx today and he does not see any improvement in chest and head congestion or productive cough with yellow phlegm. i advised that he should give the abx time to work, that zpak can stay in his system and keep working x 10 days after last dose. Pt states he is flying out of town on Monday and doesnt want to still be sik when he flys so he is asking for another round of ABx. please advise. Carron Curie CMA  July 11, 2010 12:37 PM    Additional Follow-up for Phone Call Additional follow up Details #1::        Prednisone taper over next week  fluids and rest  Please contact office for sooner follow up if symptoms do not improve or worsen  prednisone 10mg  4 tabs for 2 days, then 3 tabs for 2 days, 2 tabs for 2 days, then 1 tab for 2 days, then stop #20 , no refills  Additional Follow-up by: Rubye Oaks NP,  July 11, 2010 12:40 PM    Additional Follow-up for Phone Call Additional follow up Details #2::    pt aware of recs and rx sent.Carron Curie CMA  July 11, 2010 12:51 PM   New/Updated Medications: PREDNISONE 10 MG TABS (PREDNISONE) 4 tabs for 2 days, then 3 tabs for 2 days, 2 tabs for 2 days, then 1 tab for 2 days, then stop Prescriptions: PREDNISONE 10 MG TABS (PREDNISONE) 4 tabs for 2 days, then 3 tabs for 2 days, 2 tabs for 2 days, then 1 tab for 2 days, then stop  #20 x 0   Entered by:   Carron Curie CMA   Authorized by:   Rubye Oaks NP   Signed by:   Carron Curie CMA on  07/11/2010   Method used:   Electronically to        [facility] #[id number]* (retail)       [address]       [place], [place]  [address]       Ph: [phone]       Fax: [phone]   RxID:   [id number]

## 2010-07-29 ENCOUNTER — Telehealth: Payer: Self-pay | Admitting: Internal Medicine

## 2010-07-29 MED ORDER — ATENOLOL 50 MG PO TABS: 50.0000 mg | ORAL_TABLET | Freq: Two times a day (BID) | ORAL | Status: DC

## 2010-07-29 NOTE — Telephone Encounter (Signed)
Spoke with pt and advised that he needs to increase atenolol to 50 mg bid until next ov.  Last ov note however, does not state when he should return.  Please advise when he needs to come in thanks

## 2010-07-29 NOTE — Telephone Encounter (Signed)
Before rx for atenolol needs to be refilled, ok to see Tammy P for this

## 2010-07-29 NOTE — Telephone Encounter (Signed)
Spoke with pt and he states he went to the dentist yesterday and his BP was running 153/94. He states they checked it several times and it stayed the same. He denies any headaches or vision disturbances. He states he has not checked his BP in a while. At last OV with TP is was 126/90. Pt is currently taking Atenolol 50 mg daily. Please advise. Carron Curie, MA

## 2010-07-29 NOTE — Telephone Encounter (Signed)
Spoke with pt and sched appt with TP for 08/13/10 at noon and new rx for atenolol was sent to [facility] [address]

## 2010-07-29 NOTE — Telephone Encounter (Signed)
LMOMTCB

## 2010-07-29 NOTE — Telephone Encounter (Signed)
[email or web address]. Carron Curie, MA

## 2010-07-29 NOTE — Telephone Encounter (Signed)
Increase atenolol to 50 mg twice daily until next ov

## 2010-08-11 ENCOUNTER — Encounter: Payer: Self-pay | Admitting: Internal Medicine

## 2010-08-13 ENCOUNTER — Ambulatory Visit: Payer: 59 | Admitting: Adult Health

## 2010-08-22 ENCOUNTER — Other Ambulatory Visit: Payer: Self-pay | Admitting: Internal Medicine

## 2010-09-26 NOTE — Assessment & Plan Note (Signed)
[facility]                               PULMONARY OFFICE NOTE   Richard Roach, Richard Roach                             MRN:          [id number]  DATE:12/08/2005                            DOB:          03/17/1956    This is a primary service/acute office evaluation.   HISTORY:  A 55 year old white male with hypertension bothered by  intermittent rectal itching and burning for the last several weeks.  He  denies any significant rectal bleeding, change in bowel or bladder habits,  or abdominal pain.   PHYSICAL EXAMINATION:  GENERAL:  He is a pleasant, ambulatory white male in  no acute distress.  VITAL SIGNS:  Afebrile.  Blood pressure 136/84.  HEENT:  Unremarkable.  Pharynx is clear.  LUNGS:  Lung fields are completely clear bilaterally to auscultation and  percussion.  HEART:  Regular rhythm without murmur, rub or gallop.  ABDOMEN:  Soft, benign.  EXTREMITIES:  Warm without calf tenderness, clubbing, cyanosis or edema.  RECTAL:  Mild external hemorrhoids but no obvious rash.  Stool guaiac is  negative.  Prostate is normal.   IMPRESSION:  1.  Hypertension:  Well controlled on the present regimen, so I did not      change.  2.  Probable hemorrhoidal itching and burning:  Recommended Epsom salt baths      and Proctosol-HC cream applied at bedtime with followup by GI p.r.n.                                   Charlaine Dalton. Sherene Sires, MD, [facility]   MBW/MedQ  DD:  12/08/2005  DT:  12/08/2005  Job #:  [id number]

## 2010-09-26 NOTE — Assessment & Plan Note (Signed)
[facility]                               PULMONARY OFFICE NOTE   SANTEZ, WOODCOX                             MRN:          [id number]  DATE:03/11/2006                            DOB:          1956-04-28    PULMONARY/ACUTE OFFICE EVALUATION:   HISTORY:  A 55 year old white male, a former smoker with increasing symptoms  of nasal congestion and frequent throat clearing, especially when lying down  and early in the morning, now with new onset chest discomfort during  coughing paroxysms. He has minimal discolored sputum production.  No fevers,  chills, sweats, orthopnea, PND or leg swelling.   He is mainly here today because he feels his chest is swelling from an area  over the subxyphoid.  He has already tried Advil with no relief.   His only medication is Toprol 50 mg 1/2 daily.   PHYSICAL EXAMINATION:  He is a very anxious but pleasant ambulatory white  man in no acute distress.  He has stable vital signs.  HEENT:  Is remarkable for moderate __________ .  oropharynx clear with no  erythema.  No evidence of excessive post nasal drainage, yet he clears his  throat constantly during the exam.  NECK:  Supple without cervical adenopathy or tenderness.  Trachea is  midline.  No thyromegaly.  LUNGS:  Fields perfectly clear by auscultation and percussion.  There is  minimal tenderness over the subxiphoid.  There is a regular rate and rhythm in the apex area.  ABDOMEN: Soft and benign.  There is some minimal tenderness over the  epigastrium.  EXTREMITIES:  Warm without calf tenderness, cyanosis, clubbing, edema.   IMPRESSION:  1. Chronic rhinitis with acute exacerbation resulting in postnasal drip      syndrome, excessive coughing and throat clearing which resulted in      secondary chest wall discomfort from muscle spasm.  I doubt he      fractured a rib from coughing, although this is certainly a risk in      patients who develop a refractory cough  syndromes.  2. This is actually the 5th time we had the conversation with regarding      optimal treatment directed to rhinitis (frequent airplane traveler gets      sinus. Infections) calling in for antibiotics between office visits      for which Johnathan Hausen  has worked successfully in the past with no side      effects.  3. I did recommend that he be treated with another 5 days of Ketek, but      also reiterated optimal instructions directed at the underlying      problem, which is chronic nonspecific rhinitis with nasal steroids in      the form of Veramyst, Advil Cold & Sinus p.r.n., Robitussin DM p.r.n.      and tramadol 50 mg q.4 h. p.r.n. chest pain or excess coughing.  4. To treat the inflammatory component that is typically present with      upper airway syndromes I recommended prednisone but only  for 6 days and      a followup chest x-ray that pending at the time of dictation.   I spent almost 25 minutes with the patient going over the differential  diagnosis including the fact that it is very likely that chest wall  discomfort made worse by coughing in the anterior location, in this  particular patient it is extremely unlikely it is pneumonia or heart problem  and the fact that the pain can be reproduced easily with palpation over the  superficial muscles is strongly indicative of the mechanism outlined above.    ______________________________  Charlaine Dalton. Sherene Sires, MD, [facility]    MBW/MedQ  DD: 03/11/2006  DT: 03/11/2006  Job #: [id number]

## 2010-10-31 ENCOUNTER — Telehealth: Payer: Self-pay | Admitting: Internal Medicine

## 2010-10-31 NOTE — Telephone Encounter (Signed)
Called and spoke with pt.  Pt states he just had a question regarding his mother (who isn't an established pt here).  He states she is on 2 bp meds and her bp is 120/54.  Pt worried about pt being on 2 bp meds and also wanted to know if the 54 was too low of a bp.  Informed pt's son, that it isn't uncommon for patients to have to be on more than one bp med but recommended pt call his mother's pcp  who can better answer these questions for him.  Pt verbalized understanding.  Nothing further needed.

## 2010-11-07 ENCOUNTER — Other Ambulatory Visit: Payer: Self-pay | Admitting: Internal Medicine

## 2010-12-05 ENCOUNTER — Ambulatory Visit (INDEPENDENT_AMBULATORY_CARE_PROVIDER_SITE_OTHER): Payer: BC Managed Care – PPO | Admitting: Internal Medicine

## 2010-12-05 ENCOUNTER — Encounter: Payer: Self-pay | Admitting: Internal Medicine

## 2010-12-05 VITALS — BP 122/80 | HR 51 | Temp 97.4°F | Ht 70.0 in | Wt 178.8 lb

## 2010-12-05 DIAGNOSIS — J309 Allergic rhinitis, unspecified: Secondary | ICD-10-CM

## 2010-12-05 DIAGNOSIS — I10 Essential (primary) hypertension: Secondary | ICD-10-CM

## 2010-12-05 DIAGNOSIS — K4090 Unilateral inguinal hernia, without obstruction or gangrene, not specified as recurrent: Secondary | ICD-10-CM

## 2010-12-05 NOTE — Progress Notes (Signed)
Subjective:     Patient ID: Richard Roach, male   DOB: [birth year], 55 y.o.   MRN: [id number]  HPI     68 yowm with minimum smoking history and documented chronic rhinitis with allergies to dust and mold cats with a recurrent pattern of nonspecific rhinitis triggered by viral upper respiratory infections or airplane travel not compliant with nasal steroids.    July 15, 2009 cc head and chest congestion x 2 weeks mucus is yellow, rx with ns on his own. denies chronic symptoms but records indicate many phone calls and non-adherence with nasal steroids. no sinus pain.   March 26, 2010 ov Sinus pressure and aches.x 2 days with sore throat, fever aches while in [employer] > flew to [employer]. I emphasized that nasal steroids (nasonex) have no immediate benefit in terms of improving symptoms.   12/05/2010 ov/Wert cc RLQ pain similar to 8 years ago, occurred pulling heavy cart over 4th July, waxes and wanes worse bending no change in bowel/ bladder function, nausea.   Pt denies any significant sore throat, dysphagia, itching, sneezing,  nasal congestion or excess/ purulent secretions,  fever, chills, sweats, unintended wt loss, pleuritic or exertional cp, hempoptysis, orthopnea pnd or leg swelling.    Also denies any obvious fluctuation of symptoms with weather or environmental changes or other aggravating or alleviating factors.    Past Medical History:  HYPERCHOLESTEROLEMIA (ICD-272.0)  - Target LDL < 130 (Type A plus male gender)  ALLERGIC RHINITIS, CHRONIC (ICD-477.9)  - Chronic rhinitis flyers reviewed 05/27/99, 05/16/02, 12/23/04 and 03/11/06  - Pt denied ever receiving a chronic rhinitis flyer July 15, 2009, not using it March 26, 2010 - Chronic rhinitis flyers reviewed 05/27/99, 05/16/02, 12/23/04 and 03/11/06  - Pt denied ever receiving a chronic rhinitis flyer July 15, 2009, not using it March 26, 2010  HYPERTENSION (ICD-401.9)  Colon Polyps  - See Colonoscopy 09/19/2007  ................................[facility]  Health Maintenance......................................................Marland KitchenWert  - Td 09/2007  - Pneumovax 05/2003 (second shot)  - CPX May 02, 2010   Family History:  asthma in his father died in [place] at 35  negative for cancer  mother healthy  older brother by 11 years and healthy   Social History:  Former smoker. Quit in 2000, socially.  married  2 children  Sales, freq flyer  ETOH- daily, wine   Review of Systems     Objective:   Physical Exam Anxious wm nad   Wt 178 12/05/10 HEENT: nl dentition, turbinates, and orophanx. Nl external ear canals without cough reflex   NECK :  without JVD/Nodes/TM/ nl carotid upstrokes bilaterally   LUNGS: no acc muscle use, clear to A and P bilaterally without cough on insp or exp maneuvers   CV:  RRR  no s3 or murmur or increase in P2, no edema   ABD:  soft and nontender with nl excursion in the supine position. No bruits or organomegaly, bowel sounds nl  MS:  warm without deformities, calf tenderness, cyanosis or clubbing  SKIN: warm and dry without lesions    NEURO:  alert, approp, no deficits   GU minimal R IH bulge/ tenderness  easily reducible . No testicular mass, nl fem artery, no nodes    Assessment:         Plan:

## 2010-12-05 NOTE — Patient Instructions (Signed)
Aleve vs Advil with meals as needed  If pain resolves with rest and over the counter medications you are all set as long as you don't develop a signficant bulge which indicates possible intestinal protrusion into the canal which risks obstruction from getting kinked.   If not, you need referral to a general surgeon to consider a surgical repair of the inguinal canal

## 2010-12-06 ENCOUNTER — Encounter: Payer: Self-pay | Admitting: Internal Medicine

## 2010-12-06 DIAGNOSIS — R109 Unspecified abdominal pain: Secondary | ICD-10-CM | POA: Insufficient documentation

## 2010-12-06 NOTE — Assessment & Plan Note (Signed)
Adequate control on present rx, reviewed  

## 2010-12-06 NOTE — Assessment & Plan Note (Signed)
Continues to stop ics as soon as feels better despite repeat very strong recs to the contrary.  The best we can do is hope he'll restart these early with afrin x 5 days only at onset of flare, reviewed again today with pt in detail.

## 2010-12-06 NOTE — Assessment & Plan Note (Signed)
Minimal findings, no evidence of incarceration or strangulation, etc > rx conservatively with Surgery eval w/in one week if not improving

## 2010-12-07 ENCOUNTER — Emergency Department (HOSPITAL_BASED_OUTPATIENT_CLINIC_OR_DEPARTMENT_OTHER)
Admission: EM | Admit: 2010-12-07 | Discharge: 2010-12-07 | Disposition: A | Discharge status: Home / Self Care | Payer: BC Managed Care – PPO | Attending: Emergency Medicine | Admitting: Emergency Medicine

## 2010-12-07 ENCOUNTER — Encounter (HOSPITAL_BASED_OUTPATIENT_CLINIC_OR_DEPARTMENT_OTHER): Payer: Self-pay | Admitting: Emergency Medicine

## 2010-12-07 DIAGNOSIS — E7800 Pure hypercholesterolemia, unspecified: Secondary | ICD-10-CM | POA: Insufficient documentation

## 2010-12-07 DIAGNOSIS — K4090 Unilateral inguinal hernia, without obstruction or gangrene, not specified as recurrent: Secondary | ICD-10-CM | POA: Insufficient documentation

## 2010-12-07 DIAGNOSIS — I10 Essential (primary) hypertension: Secondary | ICD-10-CM | POA: Insufficient documentation

## 2010-12-07 DIAGNOSIS — R109 Unspecified abdominal pain: Secondary | ICD-10-CM | POA: Insufficient documentation

## 2010-12-07 LAB — URINALYSIS, ROUTINE W REFLEX MICROSCOPIC
Bilirubin Urine: NEGATIVE
Glucose, UA: NEGATIVE mg/dL
Hgb urine dipstick: NEGATIVE
Ketones, ur: NEGATIVE mg/dL
Leukocytes, UA: NEGATIVE
pH: 7 (ref 5.0–8.0)

## 2010-12-07 NOTE — ED Provider Notes (Signed)
History     Chief Complaint  Patient presents with  . Abdominal Pain    R groin pain   Patient is a 55 y.o. male presenting with abdominal pain. The history is provided by the patient.  Abdominal Pain The primary symptoms of the illness do not include abdominal pain, fever, nausea, vomiting, diarrhea or dysuria. Primary symptoms comment: pain to right inguinal area Episode onset: one month ago. The onset of the illness was gradual.  Associated with: started after pulling a heavy cart through the sand. Symptoms associated with the illness do not include constipation, urgency, hematuria, frequency or back pain.    Past Medical History  Diagnosis Date  . Hypercholesterolemia   . Allergic rhinitis   . Hypertension   . Colon polyps     Past Surgical History  Procedure Date  . Knee arthroscopy June 2011    right  . Knee arthroscopy w/ debridement     Family History  Problem Relation Age of Onset  . Asthma Father     History  Substance Use Topics  . Smoking status: Former Smoker    Types: Cigarettes    Quit date: 05/11/1998  . Smokeless tobacco: Not on file   Comment: smoked only socially x 5 yrs  . Alcohol Use: 1.8 oz/week    3 Glasses of wine per week     daily, wine      Review of Systems  Constitutional: Negative for fever and appetite change.  HENT: Negative.   Eyes: Negative.   Respiratory: Negative.   Cardiovascular: Negative.   Gastrointestinal: Negative for nausea, vomiting, abdominal pain, diarrhea, constipation and abdominal distention.  Genitourinary: Negative for dysuria, urgency, frequency, hematuria, scrotal swelling, difficulty urinating, penile pain and testicular pain.  Musculoskeletal: Negative.  Negative for back pain.  Skin: Negative.   Neurological: Negative.     Physical Exam  BP 141/78  Pulse 60  Temp(Src) 97.5 F (36.4 C) (Oral)  Resp 18  SpO2 99%  Physical Exam  Constitutional: He is oriented to person, place, and time. He  appears well-developed and well-nourished.  HENT:  Head: Normocephalic.  Eyes: Pupils are equal, round, and reactive to light.  Neck: Neck supple.  Cardiovascular: Normal rate, regular rhythm and normal heart sounds.   Pulmonary/Chest: Effort normal and breath sounds normal.  Abdominal: Soft. Bowel sounds are normal. There is no tenderness. There is no rebound and no guarding.  Genitourinary: Penis normal.       Some mild tenderness in right inguinal area, no definite hernia palpated.  On testicular pain or masses  Musculoskeletal: Normal range of motion.  Neurological: He is alert and oriented to person, place, and time.  Skin: Skin is warm and dry.    ED Course  Procedures  Results for orders placed during the hospital encounter of 12/07/10  URINALYSIS, ROUTINE W REFLEX MICROSCOPIC      Component Value Range   Color, Urine YELLOW  YELLOW    Appearance CLEAR  CLEAR    Specific Gravity, Urine 1.028  1.005 - 1.030    pH 7.0  5.0 - 8.0    Glucose, UA NEGATIVE  NEGATIVE (mg/dL)   Hgb urine dipstick NEGATIVE  NEGATIVE    Bilirubin Urine NEGATIVE  NEGATIVE    Ketones, ur NEGATIVE  NEGATIVE (mg/dL)   Protein, ur NEGATIVE  NEGATIVE (mg/dL)   Urobilinogen, UA 0.2  0.0 - 1.0 (mg/dL)   Nitrite NEGATIVE  NEGATIVE    Leukocytes, UA NEGATIVE  NEGATIVE  No results found.   MDM No pain over appendix or anywhere in abd.  No pain to testes or epididymus.  Feel like this is likely hernia.  Explained to him to return for any increased pain or knot that cannot be reduced.  He has been given f/u to surgeon by his PMD.  Will also give him name of surgeon on call.      Rolan Bucco, MD 12/07/10 [phone]

## 2010-12-07 NOTE — ED Notes (Signed)
MD at bedside.Care plan and follow up reviewed

## 2010-12-07 NOTE — ED Notes (Signed)
Pt reports lower RLQ pain after [place] on beach [place]

## 2010-12-07 NOTE — ED Notes (Signed)
Follow up with PMD reviewed RT diagnosis of hernia

## 2010-12-08 ENCOUNTER — Telehealth: Payer: Self-pay | Admitting: Internal Medicine

## 2010-12-08 DIAGNOSIS — K4090 Unilateral inguinal hernia, without obstruction or gangrene, not specified as recurrent: Secondary | ICD-10-CM

## 2010-12-08 NOTE — Telephone Encounter (Signed)
LMTCB

## 2010-12-08 NOTE — Telephone Encounter (Signed)
Called spoke with patient, advised of MW's recs.  Pt prefers to have the CT done first then see a surgeon if necessary.  Order for CT placed.  Pt prefers Thursday morning.  Will forward to MW to make him aware.

## 2010-12-08 NOTE — Telephone Encounter (Signed)
Pt hasnt heard anything. Wants to talk to nurse Henderson Cloud

## 2010-12-08 NOTE — Telephone Encounter (Signed)
Called and spoke with pt.  Pt states he saw MW on 7/27 for lower abd pain.  Pt states the pain has persisted and "gotten worse."  OTC pain meds not helping.  Pt is currently out of town on his way to [place].  Will return to [place] this Wednesday.  Pt is wanting to know if MW can order an MRI and also refer him to a Careers adviser.  Please advise.  Thanks.

## 2010-12-08 NOTE — Telephone Encounter (Signed)
Aware but the CT is not by itself an adequate eval, needs to go ahead and make the appt and cancel it if he feels better rather than wait until the end of the week to find him a doctor

## 2010-12-08 NOTE — Telephone Encounter (Signed)
Needs to be referred to surgery for R IH - if can't get him in this week then ok to do ct pelvis with contrast, not mri

## 2010-12-09 NOTE — Telephone Encounter (Signed)
Spoke to pt and he agrees to see general surgery order sent to pcc to schedule and call pt

## 2010-12-09 NOTE — Telephone Encounter (Signed)
[phone] pt return call. June Leap Lilly

## 2010-12-11 ENCOUNTER — Ambulatory Visit (INDEPENDENT_AMBULATORY_CARE_PROVIDER_SITE_OTHER)
Admission: RE | Admit: 2010-12-11 | Discharge: 2010-12-11 | Disposition: A | Discharge status: Home / Self Care | Payer: BC Managed Care – PPO | Source: Ambulatory Visit | Attending: Internal Medicine | Admitting: Internal Medicine

## 2010-12-11 ENCOUNTER — Ambulatory Visit (INDEPENDENT_AMBULATORY_CARE_PROVIDER_SITE_OTHER): Payer: BC Managed Care – PPO | Admitting: Surgery

## 2010-12-11 ENCOUNTER — Telehealth: Payer: Self-pay | Admitting: Internal Medicine

## 2010-12-11 DIAGNOSIS — N433 Hydrocele, unspecified: Secondary | ICD-10-CM

## 2010-12-11 DIAGNOSIS — K4090 Unilateral inguinal hernia, without obstruction or gangrene, not specified as recurrent: Secondary | ICD-10-CM

## 2010-12-11 MED ORDER — IOHEXOL 300 MG/ML  SOLN
80.0000 mL | Freq: Once | INTRAMUSCULAR | Status: AC | PRN
  Administered 2010-12-11: 80 mL via INTRAVENOUS

## 2010-12-11 NOTE — Telephone Encounter (Signed)
Spoke with pt and notified of results/recs per MW. Pt verbalized understanding and order was sent to [facility] for referral to urologist.

## 2010-12-11 NOTE — Telephone Encounter (Signed)
Pt calling again about his ct results.Richard Roach

## 2010-12-11 NOTE — Telephone Encounter (Signed)
Reviewed under results.

## 2010-12-11 NOTE — Telephone Encounter (Signed)
Dr Sherene Sires, please advise results of CT abd pelvis once reviewed, thanks!

## 2010-12-12 NOTE — Progress Notes (Signed)
Quick Note:  Spoke with pt and notified of results per Dr. Wert. Pt verbalized understanding and denied any questions.  ______ 

## 2010-12-13 ENCOUNTER — Telehealth: Payer: Self-pay | Admitting: Pulmonary Disease

## 2010-12-13 DIAGNOSIS — J069 Acute upper respiratory infection, unspecified: Secondary | ICD-10-CM

## 2010-12-13 MED ORDER — AZITHROMYCIN 250 MG PO TABS: ORAL_TABLET | ORAL | Status: AC

## 2010-12-13 NOTE — Telephone Encounter (Signed)
Pt calls with 3-4 day h/o worsening sinus and chest congestion, cough with discolored mucus.  Is concerned because he is getting ready to fly out of town in next day or so.  No f/c/s, no breathing issues.    Will call in zpak for him since he is going OOT, and asked him to use tylenol cold and sinus as needed.  To call us if not improving.

## 2010-12-16 ENCOUNTER — Encounter (INDEPENDENT_AMBULATORY_CARE_PROVIDER_SITE_OTHER): Payer: Self-pay | Admitting: Surgery

## 2010-12-16 ENCOUNTER — Ambulatory Visit (INDEPENDENT_AMBULATORY_CARE_PROVIDER_SITE_OTHER): Payer: BC Managed Care – PPO | Admitting: Surgery

## 2010-12-16 VITALS — BP 142/100 | HR 60 | Temp 97.0°F | Ht 70.0 in | Wt 178.8 lb

## 2010-12-16 DIAGNOSIS — K4090 Unilateral inguinal hernia, without obstruction or gangrene, not specified as recurrent: Secondary | ICD-10-CM

## 2010-12-16 NOTE — Patient Instructions (Signed)
I don't think you have a significant hernia. If you develop a bulge come back to see me. Youmay have normal activities and exercise as you wish

## 2010-12-16 NOTE — Progress Notes (Signed)
CC: Inguinal hernia HPI: The patient was doing some straining and pulling while he was at the beach and developed some discomfort in the right inguinal area. He saw his primary care physician and was thought to possibly have a hernia based on physical examination and some tenderness in the inguinal canal. A CT scan was done which showed a small left inguinal hernia containing only fat but no right inguinal hernia. The right side was the symptomatic side. He has never had any discomfort on the left side.  ROS: His 15 point ROS form was filled out and was essentially negative.  MEDS:Med list reviewed  ALLERGIES:List reviewed   PE General: The patient is alert oriented and generally healthy-appearing Abdomen: The abdomen is soft and benign. There are no masses or organomegaly. No abdominal hernias are noted. GU: Testes appear normal. He is tender along the right cord structures up into the inguinal canal. I did not feel a definite hernia on either side.  Data Reviewed I have looked over the nose from his primary care office as well as his CT report. He notes that he saw a urologist because the CT suggested the possibility of a right hydrocele but was told everything was "OK".  Assessment Possible small left inguinal hernia by CT, not symptomatic no apparent right inguinal hernia by physical exam.  Plan I told him that at this point I did not think surgery was indicated. He may be going to develop a hernia either on the right or the left side, but I told him that I didn't think it was helpful to restrict any of his normal activities or not exercise. If he does develop pain, or bulge that we will need to reevaluate his situation.

## 2010-12-23 ENCOUNTER — Encounter: Payer: Self-pay | Admitting: Internal Medicine

## 2010-12-26 ENCOUNTER — Encounter: Payer: Self-pay | Admitting: Adult Health

## 2010-12-26 ENCOUNTER — Ambulatory Visit (INDEPENDENT_AMBULATORY_CARE_PROVIDER_SITE_OTHER): Payer: BC Managed Care – PPO | Admitting: Adult Health

## 2010-12-26 DIAGNOSIS — J019 Acute sinusitis, unspecified: Secondary | ICD-10-CM

## 2010-12-26 MED ORDER — CEFDINIR 300 MG PO CAPS: 300.0000 mg | ORAL_CAPSULE | Freq: Two times a day (BID) | ORAL | Status: AC

## 2010-12-26 MED ORDER — MOMETASONE FUROATE 50 MCG/ACT NA SUSP: 2.0000 | Freq: Every day | NASAL | Status: DC

## 2010-12-26 MED ORDER — FEXOFENADINE HCL 180 MG PO TABS: 180.0000 mg | ORAL_TABLET | Freq: Every day | ORAL | Status: DC | PRN

## 2010-12-26 NOTE — Patient Instructions (Signed)
Omnicef 300mg  Twice daily  For 10 days with food, eat yogurt  Mucinex DM Twice daily  As needed  Cough/congestion  Saline nasal rinses As needed   Begin Allegra 180mg  daily (over the counter). Use after finish antibiotics for about 6 weeks then may use As needed  For sinus drainage, drippy nose.  Begin Nasonex 2 puffs Twice daily   May need to use above sinus regimen on more regular basis during "allergy season" Mid February- Mid May And Mid August -October.  May try Afrin 1 puff /nose prior to flight to see if this helps.  Please contact office for sooner follow up if symptoms do not improve or worsen or seek emergency care

## 2011-01-01 DIAGNOSIS — J019 Acute sinusitis, unspecified: Secondary | ICD-10-CM | POA: Insufficient documentation

## 2011-01-01 NOTE — Assessment & Plan Note (Signed)
Omnicef 300mg Twice daily  For 10 days with food, eat yogurt  Mucinex DM Twice daily  As needed  Cough/congestion  Saline nasal rinses As needed   Begin Allegra 180mg daily (over the counter). Use after finish antibiotics for about 6 weeks then may use As needed  For sinus drainage, drippy nose.  Begin Nasonex 2 puffs Twice daily   May need to use above sinus regimen on more regular basis during "allergy season" Mid February- Mid May And Mid August -October.  May try Afrin 1 puff /nose prior to flight to see if this helps.  Please contact office for sooner follow up if symptoms do not improve or worsen or seek emergency care    

## 2011-01-01 NOTE — Progress Notes (Signed)
Subjective:     Patient ID: Richard Roach, male   DOB: [birth year], 55 y.o.   MRN: [id number]  HPI     38 yowm with minimum smoking history and documented chronic rhinitis with allergies to dust and mold cats with a recurrent pattern of nonspecific rhinitis triggered by viral upper respiratory infections or airplane travel not compliant with nasal steroids.    July 15, 2009 cc head and chest congestion x 2 weeks mucus is yellow, rx with ns on his own. denies chronic symptoms but records indicate many phone calls and non-adherence with nasal steroids. no sinus pain.   March 26, 2010 ov Sinus pressure and aches.x 2 days with sore throat, fever aches while in [employer] > flew to [employer]. I emphasized that nasal steroids (nasonex) have no immediate benefit in terms of improving symptoms.   12/05/2010 ov/Wert cc RLQ pain similar to 8 years ago, occurred pulling heavy cart over 4th July, waxes and wanes worse bending no change in bowel/ bladder function, nausea.   12/26/10 Acute OV  Pt presents for an acute office visit. Complains sinus pressure/congestion with clear mucus, PND, chest congestion, dry cough, no energy x2weeks - finished zpak given 8.4.12. 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 Seems to get congested esp in the spring and fall with subsequent sinus infections.  OTC not helping.  No fever or chest pain .     Past Medical History:  HYPERCHOLESTEROLEMIA (ICD-272.0)  - Target LDL < 130 (Type A plus male gender)  ALLERGIC RHINITIS, CHRONIC (ICD-477.9)  - Chronic rhinitis flyers reviewed 05/27/99, 05/16/02, 12/23/04 and 03/11/06  - Pt denied ever receiving a chronic rhinitis flyer July 15, 2009, not using it March 26, 2010 - Chronic rhinitis flyers reviewed 05/27/99, 05/16/02, 12/23/04 and 03/11/06  - Pt denied ever receiving a chronic rhinitis flyer July 15, 2009, not using it March 26, 2010  HYPERTENSION (ICD-401.9)  Colon Polyps  - See Colonoscopy 09/19/2007  [facility]  Health Maintenance......................................................Marland KitchenWert  - Td 09/2007  - Pneumovax 05/2003 (second shot)  - CPX May 02, 2010   Family History:  asthma in his father died in [place] at 58  negative for cancer  mother healthy  older brother by 11 years and healthy   Social History:  Former smoker. Quit in 2000, socially.  married  2 children  Sales, freq flyer  ETOH- daily, wine   Review of Systems     Objective:   Physical Exam Anxious wm nad   Wt 178 12/05/10 HEENT: nl dentition, turbinates, and orophanx. Nl external ear canals without cough reflex Nasal drainage-clear. Max sinus tenderness   NECK :  without JVD/Nodes/TM/ nl carotid upstrokes bilaterally   LUNGS: no acc muscle use, clear to A and P bilaterally without cough on insp or exp maneuvers   CV:  RRR  no s3 or murmur or increase in P2, no edema   ABD:  soft and nontender with nl excursion in the supine position. No bruits or organomegaly, bowel sounds nl  MS:  warm without deformities, calf tenderness, cyanosis or clubbing  SKIN: warm and dry without lesions    NEURO:  alert, approp, no deficits        Assessment:         Plan:

## 2011-01-14 ENCOUNTER — Ambulatory Visit: Payer: BC Managed Care – PPO | Admitting: Internal Medicine

## 2011-04-24 ENCOUNTER — Other Ambulatory Visit: Payer: Self-pay | Admitting: Internal Medicine

## 2011-04-27 ENCOUNTER — Other Ambulatory Visit: Payer: Self-pay | Admitting: Allergy

## 2011-04-27 ENCOUNTER — Encounter: Payer: BC Managed Care – PPO | Admitting: Internal Medicine

## 2011-04-27 MED ORDER — SILDENAFIL CITRATE 50 MG PO TABS: 50.0000 mg | ORAL_TABLET | ORAL | Status: DC

## 2011-05-07 ENCOUNTER — Ambulatory Visit (INDEPENDENT_AMBULATORY_CARE_PROVIDER_SITE_OTHER)
Admission: RE | Admit: 2011-05-07 | Discharge: 2011-05-07 | Disposition: A | Discharge status: Home / Self Care | Payer: BC Managed Care – PPO | Source: Ambulatory Visit | Attending: Internal Medicine | Admitting: Internal Medicine

## 2011-05-07 ENCOUNTER — Other Ambulatory Visit: Payer: Self-pay | Admitting: Internal Medicine

## 2011-05-07 ENCOUNTER — Encounter: Payer: Self-pay | Admitting: Internal Medicine

## 2011-05-07 ENCOUNTER — Other Ambulatory Visit (INDEPENDENT_AMBULATORY_CARE_PROVIDER_SITE_OTHER): Payer: BC Managed Care – PPO

## 2011-05-07 ENCOUNTER — Ambulatory Visit (INDEPENDENT_AMBULATORY_CARE_PROVIDER_SITE_OTHER): Payer: BC Managed Care – PPO | Admitting: Internal Medicine

## 2011-05-07 VITALS — BP 120/88 | HR 54 | Temp 98.0°F | Ht 70.0 in | Wt 186.6 lb

## 2011-05-07 DIAGNOSIS — J309 Allergic rhinitis, unspecified: Secondary | ICD-10-CM

## 2011-05-07 DIAGNOSIS — E7800 Pure hypercholesterolemia, unspecified: Secondary | ICD-10-CM

## 2011-05-07 DIAGNOSIS — E785 Hyperlipidemia, unspecified: Secondary | ICD-10-CM

## 2011-05-07 DIAGNOSIS — I10 Essential (primary) hypertension: Secondary | ICD-10-CM

## 2011-05-07 LAB — BASIC METABOLIC PANEL
BUN: 19 mg/dL (ref 6–23)
CO2: 28 mEq/L (ref 19–32)
Calcium: 9.1 mg/dL (ref 8.4–10.5)
Chloride: 102 mEq/L (ref 96–112)
Creatinine, Ser: 1 mg/dL (ref 0.4–1.5)
Glucose, Bld: 91 mg/dL (ref 70–99)

## 2011-05-07 LAB — CBC WITH DIFFERENTIAL/PLATELET
Basophils Absolute: 0 10*3/uL (ref 0.0–0.1)
Basophils Relative: 0.2 % (ref 0.0–3.0)
Eosinophils Absolute: 0.4 10*3/uL (ref 0.0–0.7)
Lymphocytes Relative: 19.6 % (ref 12.0–46.0)
MCHC: 34.4 g/dL (ref 30.0–36.0)
MCV: 92.9 fl (ref 78.0–100.0)
Monocytes Absolute: 0.7 10*3/uL (ref 0.1–1.0)
Neutrophils Relative %: 64.7 % (ref 43.0–77.0)
RBC: 5 Mil/uL (ref 4.22–5.81)
RDW: 12.9 % (ref 11.5–14.6)

## 2011-05-07 LAB — LIPID PANEL
Cholesterol: 248 mg/dL — ABNORMAL HIGH (ref 0–200)
HDL: 54.5 mg/dL (ref >39.00)
Triglycerides: 100 mg/dL (ref 0.0–149.0)

## 2011-05-07 LAB — URINALYSIS, ROUTINE W REFLEX MICROSCOPIC
Bilirubin Urine: NEGATIVE
Hgb urine dipstick: NEGATIVE
Ketones, ur: NEGATIVE
Total Protein, Urine: NEGATIVE
pH: 6.5 (ref 5.0–8.0)

## 2011-05-07 LAB — HEPATIC FUNCTION PANEL
Albumin: 4 g/dL (ref 3.5–5.2)
Alkaline Phosphatase: 71 U/L (ref 39–117)
Bilirubin, Direct: 0.1 mg/dL (ref 0.0–0.3)
Total Protein: 7 g/dL (ref 6.0–8.3)

## 2011-05-07 MED ORDER — ASPIRIN EC 81 MG PO TBEC: 81.0000 mg | DELAYED_RELEASE_TABLET | Freq: Every day | ORAL | Status: AC

## 2011-05-07 NOTE — Assessment & Plan Note (Signed)
Not at target rec diet/ ex and regroup in 3 months

## 2011-05-07 NOTE — Assessment & Plan Note (Signed)
Offered referral to allergy, he will let us know

## 2011-05-07 NOTE — Progress Notes (Signed)
Quick Note:  Spoke with pt and notified of results per Dr. Wert. Pt verbalized understanding and denied any questions.  ______ 

## 2011-05-07 NOTE — Progress Notes (Signed)
Subjective:    Patient ID: Richard Roach, male    DOB: [birth year],   MRN: [id number]  HPI  37 yowm with minimum smoking history and documented chronic rhinitis with allergies to dust and mold cats with a recurrent pattern of nonspecific rhinitis triggered by viral upper respiratory infections or airplane travel not compliant with nasal steroids.     05/07/2011 f/u ov/Nyashia Raney cc persisent sensation of pnds denies non-adherence to nasonex/ allegra helps some. No wheezing or chest tightness, no doe.  Sleeping ok without nocturnal  or early am exacerbation  of respiratory  C/o's  Also denies any obvious fluctuation of symptoms with weather or environmental changes or other aggravating or alleviating factors except as outlined above       .  Past Medical History:  HYPERCHOLESTEROLEMIA (ICD-272.0)  - Target LDL < 130 (Type A plus male gender)  ALLERGIC RHINITIS, CHRONIC (ICD-477.9)  - Chronic rhinitis flyers reviewed 05/27/99, 05/16/02, 12/23/04 and 03/11/06  - Pt denied ever receiving a chronic rhinitis flyer July 15, 2009, not using it March 26, 2010  - Chronic rhinitis flyers reviewed 05/27/99, 05/16/02, 12/23/04 and 03/11/06  - Pt denied ever receiving a chronic rhinitis flyer July 15, 2009, not using it March 26, 2010  HYPERTENSION (ICD-401.9)  Colon Polyps  - [facility] Colonoscopy 09/19/2007 ................................[facility]  Health Maintenance......................................................Marland KitchenWert  - Td 09/2007  - Pneumovax 05/2003 (second shot)  - CPX 05/07/2011     Family History:  Asthma/emphysema in his father died in [place] at 60  negative for cancer  mother healthy  older brother by 11 years and healthy    Social History:  Former smoker. Quit in 2000, socially.  married  2 children  Sales, freq flyer  ETOH- daily, wine   Review of Systems  Constitutional: Negative for fever, chills, diaphoresis, activity change, appetite change, fatigue and unexpected weight change.  HENT:  Negative for hearing loss, ear pain, nosebleeds, congestion, sore throat, facial swelling, rhinorrhea, sneezing, mouth sores, trouble swallowing, neck pain, neck stiffness, dental problem, voice change, postnasal drip, sinus pressure, tinnitus and ear discharge.   Eyes: Negative for photophobia, discharge, itching and visual disturbance.  Respiratory: Negative for apnea, cough, choking, chest tightness, shortness of breath, wheezing and stridor.   Cardiovascular: Negative for chest pain, palpitations and leg swelling.  Gastrointestinal: Negative for nausea, vomiting, abdominal pain, constipation, blood in stool and abdominal distention.  Genitourinary: Negative for dysuria, urgency, frequency, hematuria, flank pain, decreased urine volume and difficulty urinating.  Musculoskeletal: Negative for myalgias, back pain, joint swelling, arthralgias and gait problem.  Skin: Negative for color change, pallor and rash.  Neurological: Negative for dizziness, tremors, seizures, syncope, speech difficulty, weakness, light-headedness, numbness and headaches.  Hematological: Negative for adenopathy. Does not bruise/bleed easily.  Psychiatric/Behavioral: Negative for confusion, sleep disturbance and agitation. The patient is not nervous/anxious.        Objective:   Physical Exam  robust but anxious ambulatory white male in no acute distress  wt 190 April 30, 2009 > 190 July 15, 2009 >  > 183 May 02, 2010 > 186 05/07/2011  HEENT: nl dentition, and orophanx.Mild bilateral nonspecific turbinate edema. Nl external ear canals without cough reflex  Neck without JVD/Nodes/TM nl carotids without bruit  Lungs clear to A and P bilaterally without cough on insp or exp maneuvers  RRR no s3 or murmur or increase in P2 no displacement pmi  Abd soft and benign with nl excursion in the supine position. No bruits or organomegaly, specifically no aortic  enlargement  Ext warm without calf tenderness, cyanosis clubbing  or edema, nl pulses  Neuro alert, no deficits  MS nl gait, no restrictions  Skin no lesions  GU testes down bilaterally no IH  Rectal: smooth prostate, no nodules, stool g neg   CXR  05/07/2011 :   There is no evidence of acute cardiac or pulmonary process.      Assessment & Plan:

## 2011-05-07 NOTE — Assessment & Plan Note (Signed)
Adequate control on present rx, reviewed  

## 2011-05-07 NOTE — Patient Instructions (Signed)
Add aspirin 81 mg daily  Please remember to go to the lab and x-ray department downstairs for your tests - we will call you with the results when they are available.     Let me know if you would like to be referred to our allergist for your nasal symptoms

## 2011-07-01 ENCOUNTER — Other Ambulatory Visit: Payer: Self-pay | Admitting: Internal Medicine

## 2011-07-01 ENCOUNTER — Telehealth: Payer: Self-pay | Admitting: Internal Medicine

## 2011-07-01 NOTE — Telephone Encounter (Signed)
I sent his refill for # 10 tablets and gave 3 refills. The pt states that he is needing more than 4 tablets at a time. States that this is all the pharmacy will give him and that there is no insurance issue- "it's coming from your end". I advised will call the pharmacy and ask that they give him the full 10 tablets. Spoke with Kathie Rhodes who was the pharmacist there today, and she states that the reason he only gets 4 at 1 time is b/c this is what the insurance covers, only 4 at a time. She states will explain this to the pt AGAIN when he picks up rx today.

## 2011-09-07 ENCOUNTER — Ambulatory Visit (INDEPENDENT_AMBULATORY_CARE_PROVIDER_SITE_OTHER): Payer: BC Managed Care – PPO | Admitting: Adult Health

## 2011-09-07 ENCOUNTER — Encounter: Payer: Self-pay | Admitting: Adult Health

## 2011-09-07 ENCOUNTER — Other Ambulatory Visit: Payer: Self-pay | Admitting: Internal Medicine

## 2011-09-07 DIAGNOSIS — S39012A Strain of muscle, fascia and tendon of lower back, initial encounter: Secondary | ICD-10-CM | POA: Insufficient documentation

## 2011-09-07 DIAGNOSIS — S335XXA Sprain of ligaments of lumbar spine, initial encounter: Secondary | ICD-10-CM

## 2011-09-07 MED ORDER — METAXALONE 800 MG PO TABS: 800.0000 mg | ORAL_TABLET | Freq: Three times a day (TID) | ORAL | Status: AC | PRN

## 2011-09-07 NOTE — Progress Notes (Signed)
Subjective:    Patient ID: Richard Roach, male    DOB: 1955/09/02,   MRN: [id number]  HPI 15 yowm with minimum smoking history and documented chronic rhinitis with allergies to dust and mold cats with a recurrent pattern of nonspecific rhinitis triggered by viral upper respiratory infections or airplane travel not compliant with nasal steroids.     05/07/2011 f/u ov/Wert cc persisent sensation of pnds denies non-adherence to nasonex/ allegra helps some. No wheezing or chest tightness, no doe. >>ASA added   09/07/2011 Acute OV  Complains of  2 weeks of right lower back pain. Occurred after heavy lifting boxes.  Has been doing a lot of travel. Painful and sore, worse with movement. Muscle spasm.  Some better now. No urinary symptoms. Radiates into hip and groin.  Used thermacare and aleve with some help.  Thermacare caused some skin irriation.  No leg weakness or chest pain. No visual changes. No arm weakness.  A lot of stress lately. Some eye twitching . Went to eye doctor w/ clean check up .  No visual loss, or speech changes.      .  Past Medical History:  HYPERCHOLESTEROLEMIA (ICD-272.0)  - Target LDL < 130 (Type A plus male gender)  ALLERGIC RHINITIS, CHRONIC (ICD-477.9)  - Chronic rhinitis flyers reviewed 05/27/99, 05/16/02, 12/23/04 and 03/11/06  - Pt denied ever receiving a chronic rhinitis flyer July 15, 2009, not using it March 26, 2010  - Chronic rhinitis flyers reviewed 05/27/99, 05/16/02, 12/23/04 and 03/11/06  - Pt denied ever receiving a chronic rhinitis flyer July 15, 2009, not using it March 26, 2010  HYPERTENSION (ICD-401.9)  Colon Polyps  - See Colonoscopy 09/19/2007 ................................[facility]  Health Maintenance......................................................Marland KitchenWert  - Td 09/2007  - Pneumovax 05/2003 (second shot)  - CPX 05/07/2011     Family History:  Asthma/emphysema in his father died in [place] at 3  negative for cancer  mother healthy  older brother  by 11 years and healthy    Social History:  Former smoker. Quit in 2000, socially.  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  HEENT:   No headaches,  Difficulty swallowing,  Tooth/dental problems, or  Sore throat,                No sneezing, itching, ear ache, nasal congestion, post nasal drip,   CV:  No chest pain,  Orthopnea, PND, swelling in lower extremities, anasarca, dizziness, palpitations, syncope.   GI  No heartburn, indigestion, abdominal pain, nausea, vomiting, diarrhea, change in bowel habits, loss of appetite, bloody stools.   Resp: No shortness of breath with exertion or at rest.  No excess mucus, no productive cough,  No non-productive cough,  No coughing up of blood.  No change in color of mucus.  No wheezing.  No chest wall deformity  Skin: no rash or lesions.  GU: no dysuria, change in color of urine, no urgency or frequency.  No flank pain, no hematuria   MS:  No joint  swelling.     Psych:  No change in mood or affect. No depression or anxiety.  No memory loss.         Objective:   Physical Exam  robust but anxious ambulatory white male in no acute distress  wt 190 April 30, 2009 > 190 July 15, 2009 >  >  183 May 02, 2010 > 186 05/07/2011 >09/07/2011 183  HEENT: nl dentition, and orophanx  Neck without JVD/Nodes/TM nl carotids without bruit  Lungs clear to A and P bilaterally without cough on insp or exp maneuvers  RRR no s3 or murmur or increase in P2 no displacement pmi  Abd soft and benign with nl excursion in the supine position. No bruits or organomegaly, specifically no aortic enlargement  Ext warm without calf tenderness, cyanosis clubbing or edema, nl pulses  Tender along right lower lumbar , neg SLR , bilateral equal strength, nml DTR 2 + Neuro alert, no deficits  MS nl gait, no restrictions  Skin no  lesions        Assessment & Plan:

## 2011-09-07 NOTE — Patient Instructions (Addendum)
Advil 200mg  3 tabs w/ food for 5 days then as needed.  Skelaxin 800mg  Three times a day  As needed  Muscle spasm.  Alternate ice and heat to back  Advance activity as tolerated -back exercises as tolerated.  Takes breaks from computer, reading, fluids and rest -rest eyes Please contact office for sooner follow up if symptoms do not improve or worsen or seek emergency care

## 2011-09-07 NOTE — Assessment & Plan Note (Signed)
Advil 200mg 3 tabs w/ food for 5 days then as needed.  Skelaxin 800mg Three times a day  As needed  Muscle spasm.  Alternate ice and heat to back  Advance activity as tolerated -back exercises as tolerated.  Takes breaks from computer, reading, fluids and rest -rest eyes Please contact office for sooner follow up if symptoms do not improve or worsen or seek emergency care   

## 2011-09-16 ENCOUNTER — Telehealth: Payer: Self-pay | Admitting: Adult Health

## 2011-09-16 DIAGNOSIS — M549 Dorsalgia, unspecified: Secondary | ICD-10-CM

## 2011-09-16 DIAGNOSIS — R1030 Lower abdominal pain, unspecified: Secondary | ICD-10-CM

## 2011-09-16 NOTE — Telephone Encounter (Signed)
Lmtcb x1

## 2011-09-16 NOTE — Telephone Encounter (Signed)
Pt states he has seen at [facility]; pt has been seen approx 1 year ago for knee surgery. Order has been placed to have [facility] to help make this appt-pt would like to be seen Friday if possible. OV with TP has been cancelled as patient does not feel he should see TP for the same issue.

## 2011-09-16 NOTE — Telephone Encounter (Signed)
Needs ov with ortho -back pain now w/ radiation to groin.  Please contact office for sooner follow up if symptoms do not improve or worsen or seek emergency care

## 2011-09-16 NOTE — Telephone Encounter (Signed)
Pt returned call. Richard Roach  

## 2011-09-16 NOTE — Telephone Encounter (Signed)
PT reports that pain in back is some better but now has radiated around to groin area.  Feels like he has been punched in the area.  Pt travels a lot and wants to know if he can have some kind of ultrasound done prior to his appt with TP on Friday.  Pt denies having h/o kidney stones.  Please advise.

## 2011-09-18 ENCOUNTER — Encounter: Payer: Self-pay | Admitting: Adult Health

## 2011-09-18 ENCOUNTER — Ambulatory Visit (INDEPENDENT_AMBULATORY_CARE_PROVIDER_SITE_OTHER): Payer: BC Managed Care – PPO | Admitting: Adult Health

## 2011-09-18 ENCOUNTER — Other Ambulatory Visit: Payer: BC Managed Care – PPO

## 2011-09-18 ENCOUNTER — Ambulatory Visit: Payer: BC Managed Care – PPO | Admitting: Adult Health

## 2011-09-18 VITALS — BP 122/82 | HR 61 | Temp 97.5°F | Ht 70.0 in | Wt 182.0 lb

## 2011-09-18 DIAGNOSIS — S335XXA Sprain of ligaments of lumbar spine, initial encounter: Secondary | ICD-10-CM

## 2011-09-18 DIAGNOSIS — J069 Acute upper respiratory infection, unspecified: Secondary | ICD-10-CM

## 2011-09-18 DIAGNOSIS — J029 Acute pharyngitis, unspecified: Secondary | ICD-10-CM

## 2011-09-18 MED ORDER — AZITHROMYCIN 250 MG PO TABS: ORAL_TABLET | ORAL | Status: AC

## 2011-09-18 NOTE — Assessment & Plan Note (Signed)
Neg Strep test   Plan:  Zpack take as directed.  Delsym As needed  Cough  Salt water gargles As needed   Tylenol and rest  Please contact office for sooner follow up if symptoms do not improve or worsen or seek emergency care

## 2011-09-18 NOTE — Progress Notes (Signed)
Subjective:    Patient ID: Richard Roach, male    DOB: [birth year],   MRN: [id number]  HPI 4 yowm with minimum smoking history and documented chronic rhinitis with allergies to dust and mold cats with a recurrent pattern of nonspecific rhinitis triggered by viral upper respiratory infections or airplane travel not compliant with nasal steroids.     05/07/2011 f/u ov/Wert cc persisent sensation of pnds denies non-adherence to nasonex/ allegra helps some. No wheezing or chest tightness, no doe. >>ASA added   09/07/2011 Acute OV  Complains of  2 weeks of right lower back pain. Occurred after heavy lifting boxes.  Has been doing a lot of travel. Painful and sore, worse with movement. Muscle spasm.  Some better now. No urinary symptoms. Radiates into hip and groin.  Used thermacare and aleve with some help.  Thermacare caused some skin irriation.  No leg weakness or chest pain. No visual changes. No arm weakness.  A lot of stress lately. Some eye twitching . Went to eye doctor w/ clean check up .  No visual loss, or speech changes.  >>advil and skelaxin  09/18/2011 Acute OV  Complains of sore throat, prod cough with yellow mucus, chills, head congestion x 6 days.  Has a very sore  Throat. Strep test today is neg.  Traveling a lot for work.  Was seen 2 week ago for back strain. Got some better but never went totally away and started radiating to groin. No urinary symptoms. Was referred to ortho for evaluation , has upcoming ov on 5/13. No leg weakness or testicle pain.       .  Past Medical History:  HYPERCHOLESTEROLEMIA (ICD-272.0)  - Target LDL < 130 (Type A plus male gender)  ALLERGIC RHINITIS, CHRONIC (ICD-477.9)  - Chronic rhinitis flyers reviewed 05/27/99, 05/16/02, 12/23/04 and 03/11/06  - Pt denied ever receiving a chronic rhinitis flyer July 15, 2009, not using it March 26, 2010  - Chronic rhinitis flyers reviewed 05/27/99, 05/16/02, 12/23/04 and 03/11/06  - Pt denied ever receiving a  chronic rhinitis flyer July 15, 2009, not using it March 26, 2010  HYPERTENSION (ICD-401.9)  Colon Polyps  - [facility] Colonoscopy [facility]/19/2007 [facility]  Health Maintenance......................................................Marland KitchenWert  - Td 09/2007  - Pneumovax 05/2003 (second shot)  - CPX 05/07/2011     Family History:  Asthma/emphysema in his father died in [place] at 81  negative for cancer  mother healthy  older brother by 11 years and healthy    Social History:  Former smoker. Quit in 2000, socially.  married  2 children  Sales, freq flyer  ETOH- daily, wine   Review of Systems  Constitutional:   No  weight loss, night sweats,   +Fevers, chills,  +fatigue, or  lassitude.  HEENT:   No headaches,  Difficulty swallowing,  Tooth/dental problems, or ++ Sore throat,                No sneezing, itching, ear ache,  +nasal congestion, post nasal drip,   CV:  No chest pain,  Orthopnea, PND, swelling in lower extremities, anasarca, dizziness, palpitations, syncope.   GI  No heartburn, indigestion, abdominal pain, nausea, vomiting, diarrhea, change in bowel habits, loss of appetite, bloody stools.   Resp: No shortness of breath with exertion or at rest.  No excess mucus, no productive cough,  No non-productive cough,  No coughing up of blood.  No change in color of mucus.  No wheezing.  No chest wall deformity  Skin:  no rash or lesions.  GU: no dysuria, change in color of urine, no urgency or frequency.  No flank pain, no hematuria   MS:  No joint  swelling.     Psych:  No change in mood or affect. No depression or anxiety.  No memory loss.         Objective:   Physical Exam  robust but anxious ambulatory white male in no acute distress  wt 190 April 30, 2009 > 190 July 15, 2009 >  > 183 May 02, 2010 > 186 05/07/2011 >09/07/2011 183 >>182 09/18/2011  HEENT: nl dentition, post pharynx red, no exudate  Neck without JVD/Nodes/TM nl carotids  without bruit  Lungs clear to A and P bilaterally without cough on insp or exp maneuvers  RRR no s3 or murmur or increase in P2 no displacement pmi  Abd soft and benign with nl excursion in the supine position. No bruits or organomegaly  Ext warm without calf tenderness, cyanosis clubbing or edema, nl pulses  Tender along right lower lumbar , neg SLR , bilateral equal strength, nml DTR 2 + Neuro alert, no deficits  MS nl gait, no restrictions  Skin no lesions        Assessment & Plan:

## 2011-09-18 NOTE — Assessment & Plan Note (Signed)
Slow to resolve back pain -has upcoming ov with ortho on 5/13  Advised to use skelaxin As needed   Alternate ice and heat

## 2011-09-18 NOTE — Patient Instructions (Signed)
Zpack take as directed.  Delsym As needed  Cough  Salt water gargles As needed   Tylenol and rest  Please contact office for sooner follow up if symptoms do not improve or worsen or seek emergency care

## 2011-10-23 ENCOUNTER — Encounter (INDEPENDENT_AMBULATORY_CARE_PROVIDER_SITE_OTHER): Payer: BC Managed Care – PPO | Admitting: Surgery

## 2011-11-17 ENCOUNTER — Encounter (INDEPENDENT_AMBULATORY_CARE_PROVIDER_SITE_OTHER): Payer: BC Managed Care – PPO | Admitting: Surgery

## 2011-12-25 ENCOUNTER — Telehealth: Payer: Self-pay | Admitting: Internal Medicine

## 2011-12-25 NOTE — Telephone Encounter (Signed)
Last ROV-09/18/11, next OV none. I spoke with the pt and he states he just noticed that his bottle of atenolol 50 mg states to take medication twice a day, this is what is on the pt med list as well. Pt states he has never taken this medication more then once a day. It looks like way back on 07-29-10 the pt called and was having increased BP and MW advised at that time to increase to bid until rov. Pt states he never took the med twice a day. He states he monitors his BP and it has been fine, and BP has been normal at last several ov. I advised I will send message to Dr. Sherene Sires to clarify directions for atenolol 50mg . Carron Curie, CMA

## 2011-12-25 NOTE — Telephone Encounter (Signed)
Fine to leave it the way it is, one daily

## 2011-12-28 NOTE — Telephone Encounter (Signed)
lmomtcb x1 for pt 

## 2011-12-28 NOTE — Telephone Encounter (Signed)
Spoke with patient-aware to keep at once daily.

## 2012-02-04 ENCOUNTER — Telehealth: Payer: Self-pay | Admitting: Gastroenterology

## 2012-02-04 NOTE — Telephone Encounter (Signed)
Note not need

## 2012-02-25 ENCOUNTER — Telehealth: Payer: Self-pay | Admitting: Internal Medicine

## 2012-02-25 DIAGNOSIS — H579 Unspecified disorder of eye and adnexa: Secondary | ICD-10-CM

## 2012-02-25 NOTE — Telephone Encounter (Signed)
Spoke with patient-- Patient states that eye is still twitching and Dr Shon Hough recommended that he see a Neurologist. Wants a referral to a neurologist for his "twitching eye" x 6 months- botox hasn't helped.  Please advise Dr Sherene Sires. Thanks.

## 2012-02-25 NOTE — Addendum Note (Signed)
Addended by: Nita Sells on: 02/25/2012 09:53 AM   Modules accepted: Orders

## 2012-02-25 NOTE — Telephone Encounter (Signed)
Refer to neuro 

## 2012-02-25 NOTE — Telephone Encounter (Addendum)
Patient needs referral to Neuro for eye twitching per MW. Thanks Order has been placed for referral.

## 2012-03-07 ENCOUNTER — Telehealth: Payer: Self-pay | Admitting: Gastroenterology

## 2012-03-07 NOTE — Telephone Encounter (Signed)
OK to schedule in December

## 2012-03-07 NOTE — Telephone Encounter (Signed)
Patient is due for colon in May 2014.  He would like to schedule in December.  Is it ok to schedule early?

## 2012-04-18 ENCOUNTER — Ambulatory Visit (INDEPENDENT_AMBULATORY_CARE_PROVIDER_SITE_OTHER): Payer: BC Managed Care – PPO | Admitting: Internal Medicine

## 2012-04-18 ENCOUNTER — Other Ambulatory Visit (INDEPENDENT_AMBULATORY_CARE_PROVIDER_SITE_OTHER): Payer: BC Managed Care – PPO

## 2012-04-18 ENCOUNTER — Telehealth: Payer: Self-pay | Admitting: Pulmonary Disease

## 2012-04-18 ENCOUNTER — Ambulatory Visit (INDEPENDENT_AMBULATORY_CARE_PROVIDER_SITE_OTHER)
Admission: RE | Admit: 2012-04-18 | Discharge: 2012-04-18 | Disposition: A | Discharge status: Home / Self Care | Payer: BC Managed Care – PPO | Source: Ambulatory Visit | Attending: Internal Medicine | Admitting: Internal Medicine

## 2012-04-18 ENCOUNTER — Encounter: Payer: Self-pay | Admitting: Internal Medicine

## 2012-04-18 VITALS — BP 106/70 | HR 56 | Temp 97.9°F | Ht 70.0 in | Wt 183.8 lb

## 2012-04-18 DIAGNOSIS — Z0000 Encounter for general adult medical examination without abnormal findings: Secondary | ICD-10-CM

## 2012-04-18 DIAGNOSIS — E7800 Pure hypercholesterolemia, unspecified: Secondary | ICD-10-CM

## 2012-04-18 DIAGNOSIS — J309 Allergic rhinitis, unspecified: Secondary | ICD-10-CM

## 2012-04-18 DIAGNOSIS — I10 Essential (primary) hypertension: Secondary | ICD-10-CM

## 2012-04-18 LAB — HEPATIC FUNCTION PANEL
ALT: 21 U/L (ref 0–53)
AST: 22 U/L (ref 0–37)
Alkaline Phosphatase: 71 U/L (ref 39–117)
Bilirubin, Direct: 0.1 mg/dL (ref 0.0–0.3)
Total Bilirubin: 0.9 mg/dL (ref 0.3–1.2)
Total Protein: 7.2 g/dL (ref 6.0–8.3)

## 2012-04-18 LAB — URINALYSIS
Hgb urine dipstick: NEGATIVE
Ketones, ur: NEGATIVE
Total Protein, Urine: NEGATIVE
Urine Glucose: NEGATIVE
Urobilinogen, UA: 0.2 (ref 0.0–1.0)

## 2012-04-18 LAB — LIPID PANEL
HDL: 56.2 mg/dL (ref >39.00)
Total CHOL/HDL Ratio: 4
VLDL: 14.4 mg/dL (ref 0.0–40.0)

## 2012-04-18 LAB — TSH: TSH: 1.99 u[IU]/mL (ref 0.35–5.50)

## 2012-04-18 LAB — CBC WITH DIFFERENTIAL/PLATELET
Basophils Relative: 0.4 % (ref 0.0–3.0)
Eosinophils Relative: 4.1 % (ref 0.0–5.0)
Lymphocytes Relative: 24.7 % (ref 12.0–46.0)
MCV: 93.2 fl (ref 78.0–100.0)
Monocytes Relative: 8.9 % (ref 3.0–12.0)
Neutrophils Relative %: 61.9 % (ref 43.0–77.0)
Platelets: 258 10*3/uL (ref 150.0–400.0)
RBC: 5.13 Mil/uL (ref 4.22–5.81)
WBC: 6.8 10*3/uL (ref 4.5–10.5)

## 2012-04-18 LAB — LDL CHOLESTEROL, DIRECT: Direct LDL: 164 mg/dL

## 2012-04-18 LAB — BASIC METABOLIC PANEL
BUN: 20 mg/dL (ref 6–23)
Chloride: 102 mEq/L (ref 96–112)
Creatinine, Ser: 1.1 mg/dL (ref 0.4–1.5)
GFR: 76.25 mL/min (ref >60.00)

## 2012-04-18 NOTE — Patient Instructions (Signed)
Please remember to go to the lab and x-ray department downstairs for your tests - we will call you with the results when they are available.  I emphasized that nasal steroids(nasonex)  have no immediate benefit in terms of improving symptoms.  To help them reached the target tissue, the patient should use Afrin two puffs every 12 hours applied one min before using the nasonex.  Afrin should be stopped after no more than 5 days.  If the symptoms worsen, Afrin can be restarted after 5 days off of therapy to prevent rebound congestion from overuse of Afrin.  I also emphasized that in no way are nasal steroids a concern in terms of "addiction".      Next physical is due in year, see [place] sooner

## 2012-04-18 NOTE — Progress Notes (Signed)
Subjective:    Patient ID: Richard Roach, male    DOB: 22-Apr-1956    MRN: [id number]  HPI   88 yowm with minimum smoking history and documented chronic rhinitis with allergies to dust and mold cats with a recurrent pattern of nonspecific rhinitis triggered by viral upper respiratory infections or airplane travel not compliant with nasal steroids.       04/18/2012  CPX  With multiple chronic concerns 1) tingling mid back, did not discuss this with neurology on recent eval 2) timing for colonoscopy for polyps > in computer for recall 3) urinary hesistancy, did not discuss this with neurology 4) freq uri/ rhinitis/sinusitis ,  Not maintaining on nasonex, did not remember instructions re appropriate use of afrin x 5 days only  Sleeping ok without nocturnal  or early am exacerbation  of respiratory  c/o's or need for noct saba. Also denies any obvious fluctuation of symptoms with weather or environmental changes or other aggravating or alleviating factors except as outlined above   ROS  The following are not active complaints unless bolded sore throat, dysphagia, dental problems, itching, sneezing,  nasal congestion or excess/ purulent secretions, ear ache,   fever, chills, sweats, unintended wt loss, pleuritic or exertional cp, hemoptysis,  orthopnea pnd or leg swelling, presyncope, palpitations, heartburn, abdominal pain, anorexia, nausea, vomiting, diarrhea  or change in bowel or urinary habits, change in stools or urine, dysuria,hematuria,  rash, arthralgias, visual complaints, headache, numbness weakness or ataxia or problems with walking or coordination,  change in mood/affect or memory.          .  Past Medical History:  HYPERCHOLESTEROLEMIA (ICD-272.0)  - Target LDL < 130 (Type A plus male gender)  ALLERGIC RHINITIS, CHRONIC (ICD-477.9)  - Chronic rhinitis flyers reviewed 05/27/99, 05/16/02, 12/23/04 and 03/11/06  - Pt denied ever receiving a chronic rhinitis flyer July 15, 2009, not using  it March 26, 2010  - Chronic rhinitis flyers reviewed 05/27/99, 05/16/02, 12/23/04 and 03/11/06  - Pt denied ever receiving a chronic rhinitis flyer July 15, 2009, not using it March 26, 2010  BPH ....................................................................... [name] (ICD-401.9)  Colon Polyps  - See Colonoscopy 09/19/2007 .................................  [facility]  Health Maintenance......................................................Marland KitchenWert  - Td 09/2007  - Pneumovax 05/2003 (second shot)  - CPX 04/18/2012     Family History:  Asthma/emphysema in his father died in [place] at 6  negative for cancer  mother healthy  older brother by 11 years and healthy    Social History:  Former smoker. Quit in 2000,  Only ever socially.  married  2 children  Sales, freq flyer  ETOH- daily, wine              Objective:   Physical Exam  robust   ambulatory white male in no acute distress jumping from one concern to the other  wt 190 April 30, 2009 > 190 July 15, 2009 >  > 183 May 02, 2010 > 186 05/07/2011 >09/07/2011 183 >>182 09/18/2011  > 04/18/2012 183  HEENT: nl dentition, post pharynx red, no exudate  Neck without JVD/Nodes/TM nl carotids without bruit  Lungs clear to A and P bilaterally without cough on insp or exp maneuvers  RRR no s3 or murmur or increase in P2 no displacement pmi  Abd soft and benign with nl excursion in the supine position. No bruits or organomegaly  Ext warm without calf tenderness, cyanosis clubbing or edema, nl pulses  Tender along right lower lumbar , neg SLR ,  bilateral equal strength, nml DTR 2 + Neuro alert, anxious no deficits  MS nl gait, no restrictions  Skin no lesions  GU  Testes down bilaterally neg IH Rectal:  Mild bph, stool G neg, no nodules   CXR  04/18/2012 :    Normal except for some aortic atherosclerotic calcification.      Assessment & Plan:

## 2012-04-18 NOTE — Telephone Encounter (Signed)
Pt calling in for lab results. Pl call him back in am

## 2012-04-19 NOTE — Telephone Encounter (Signed)
ATC with results Had to [facility]

## 2012-04-19 NOTE — Assessment & Plan Note (Signed)
Adequate control on present rx, reviewed  

## 2012-04-19 NOTE — Assessment & Plan Note (Signed)
-   Target LDL < 130 due to hbp/ typeA personality  LDL slt high but trending down and hdl on high side.  Adequate control on present rx, reviewed goals with pt

## 2012-04-19 NOTE — Assessment & Plan Note (Signed)
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 Chronic right maxillary sinusitis changes with slight mucosal thickening which also involves the maxillary sinus ostium and infundibulum   - Consistently inconsistent with ICS documented 12/05/10  And 04/18/12   Reviewed again the difference between a maint (nasonex ) and prn (afrin x 5 days only)

## 2012-04-19 NOTE — Telephone Encounter (Signed)
Pt called back wanting results.  Call him back @ [phone]. Richard Roach

## 2012-04-20 NOTE — Progress Notes (Signed)
Quick Note:  Spoke with pt and notified of results per Dr. Wert. Pt verbalized understanding and denied any questions.  ______ 

## 2012-04-24 ENCOUNTER — Emergency Department (HOSPITAL_BASED_OUTPATIENT_CLINIC_OR_DEPARTMENT_OTHER): Payer: BC Managed Care – PPO

## 2012-04-24 ENCOUNTER — Encounter (HOSPITAL_BASED_OUTPATIENT_CLINIC_OR_DEPARTMENT_OTHER): Payer: Self-pay | Admitting: *Deleted

## 2012-04-24 ENCOUNTER — Emergency Department (HOSPITAL_BASED_OUTPATIENT_CLINIC_OR_DEPARTMENT_OTHER)
Admission: EM | Admit: 2012-04-24 | Discharge: 2012-04-24 | Disposition: A | Discharge status: Home / Self Care | Payer: BC Managed Care – PPO | Attending: Emergency Medicine | Admitting: Emergency Medicine

## 2012-04-24 DIAGNOSIS — Z79899 Other long term (current) drug therapy: Secondary | ICD-10-CM | POA: Insufficient documentation

## 2012-04-24 DIAGNOSIS — N4889 Other specified disorders of penis: Secondary | ICD-10-CM

## 2012-04-24 DIAGNOSIS — R739 Hyperglycemia, unspecified: Secondary | ICD-10-CM

## 2012-04-24 DIAGNOSIS — I10 Essential (primary) hypertension: Secondary | ICD-10-CM | POA: Insufficient documentation

## 2012-04-24 DIAGNOSIS — Z87891 Personal history of nicotine dependence: Secondary | ICD-10-CM | POA: Insufficient documentation

## 2012-04-24 DIAGNOSIS — R7309 Other abnormal glucose: Secondary | ICD-10-CM | POA: Insufficient documentation

## 2012-04-24 DIAGNOSIS — N489 Disorder of penis, unspecified: Secondary | ICD-10-CM | POA: Insufficient documentation

## 2012-04-24 DIAGNOSIS — Z860100 Personal history of colon polyps, unspecified: Secondary | ICD-10-CM | POA: Insufficient documentation

## 2012-04-24 DIAGNOSIS — E7800 Pure hypercholesterolemia, unspecified: Secondary | ICD-10-CM | POA: Insufficient documentation

## 2012-04-24 DIAGNOSIS — Z7982 Long term (current) use of aspirin: Secondary | ICD-10-CM | POA: Insufficient documentation

## 2012-04-24 LAB — URINALYSIS, ROUTINE W REFLEX MICROSCOPIC
Bilirubin Urine: NEGATIVE
Glucose, UA: NEGATIVE mg/dL
Ketones, ur: NEGATIVE mg/dL
Nitrite: NEGATIVE
Protein, ur: NEGATIVE mg/dL
pH: 6.5 (ref 5.0–8.0)

## 2012-04-24 LAB — GLUCOSE, CAPILLARY: Glucose-Capillary: 181 mg/dL — ABNORMAL HIGH (ref 70–99)

## 2012-04-24 MED ORDER — IBUPROFEN 800 MG PO TABS: 800.0000 mg | ORAL_TABLET | Freq: Three times a day (TID) | ORAL | Status: DC

## 2012-04-24 NOTE — ED Provider Notes (Signed)
History     CSN: [id number]  Arrival date & time 04/24/12  7829   First MD Initiated Contact with Patient 04/24/12 [phone]      Chief Complaint  Patient presents with  . Groin Pain    (Consider location/radiation/quality/duration/timing/severity/associated sxs/prior treatment) HPI Comments: Patient complains of 3 weeks of constant penis pain. Nothing makes it better or worse. It hurts to have sex. It hurts to touch. Denies any injury. Denies any fever, chills, discharge, testicular pain. No pain with urinating or hematuria. No abdominal pain, back pain, fever chills. Monogamous with wife. No history of diabetes.  The history is provided by the patient.    Past Medical History  Diagnosis Date  . Hypercholesterolemia   . Allergic rhinitis   . Hypertension   . Colon polyps     Past Surgical History  Procedure Date  . Knee arthroscopy June 2011    right  . Knee arthroscopy w/ debridement     Family History  Problem Relation Age of Onset  . Asthma Father     History  Substance Use Topics  . Smoking status: Former Smoker    Types: Cigarettes    Quit date: 05/11/1998  . Smokeless tobacco: Not on file     Comment: smoked only socially x 5 yrs  . Alcohol Use: 1.8 oz/week    3 Glasses of wine per week     Comment: daily, wine      Review of Systems  Gastrointestinal: Negative for nausea, vomiting and abdominal pain.  Genitourinary: Positive for penile pain. Negative for dysuria, discharge, penile swelling, scrotal swelling, difficulty urinating and testicular pain.  Musculoskeletal: Negative for back pain.    Allergies  Review of patient's allergies indicates no known allergies.  Home Medications   Current Outpatient Rx  Name  Route  Sig  Dispense  Refill  . ASPIRIN EC 81 MG PO TBEC   Oral   Take 1 tablet (81 mg total) by mouth daily.   150 tablet   2   . ATENOLOL 50 MG PO TABS   Oral   Take 50 mg by mouth daily.         Marland Kitchen FEXOFENADINE HCL 180 MG PO  TABS   Oral   Take 1 tablet (180 mg total) by mouth daily as needed.         . IBUPROFEN 800 MG PO TABS   Oral   Take 1 tablet (800 mg total) by mouth 3 (three) times daily.   21 tablet   0   . MOMETASONE FUROATE 50 MCG/ACT NA SUSP   Nasal   Place 2 sprays into the nose daily.   17 g   11     BP 164/89  Pulse 67  Temp 97.8 F (36.6 C) (Oral)  Resp 18  SpO2 100%  Physical Exam  Constitutional: He is oriented to person, place, and time. He appears well-developed and well-nourished. No distress.  HENT:  Head: Normocephalic and atraumatic.  Mouth/Throat: Oropharynx is clear and moist. No oropharyngeal exudate.  Eyes: Conjunctivae normal and EOM are normal. Pupils are equal, round, and reactive to light.  Neck: Normal range of motion. Neck supple.  Cardiovascular: Normal rate, regular rhythm and normal heart sounds.   Pulmonary/Chest: Effort normal and breath sounds normal. No respiratory distress.  Abdominal: Soft. There is no tenderness. There is no rebound and no guarding.  Genitourinary: Penis normal.       Penis appears normal, no discharge. Patient complains  of pain in glans area there is no erythema or rash. Testicles nontender, no lymphadenopathy, no appreciable hernias  Musculoskeletal: Normal range of motion. He exhibits no edema and no tenderness.  Neurological: He is alert and oriented to person, place, and time. No cranial nerve deficit. Coordination normal.  Skin: Skin is warm.    ED Course  Procedures (including critical care time)  Labs Reviewed  GLUCOSE, CAPILLARY - Abnormal; Notable for the following:    Glucose-Capillary 181 (*)     All other components within normal limits  URINALYSIS, ROUTINE W REFLEX MICROSCOPIC  GC/CHLAMYDIA PROBE AMP   US Scrotum  04/24/2012  *RADIOLOGY REPORT*  Clinical Data:  Testicular pain.  SCROTAL ULTRASOUND DOPPLER ULTRASOUND OF THE TESTICLES  Technique: Complete ultrasound examination of the testicles, epididymis,  and other scrotal structures was performed.  Color and spectral Doppler ultrasound were also utilized to evaluate blood flow to the testicles.  Comparison:  None  Findings:  Right testis:  4.5 x 2.6 x 3.3 cm.  No evidence of testicular mass or microlithiasis.  Internal blood flow is seen on color Doppler ultrasound.  Left testis:  4.3 x 2.6 x 3.5 cm.  No evidence of testicular mass or microlithiasis. 1 mcg on color Doppler ultrasound.  Right epididymis:  A small epididymal cyst or spermatocele measuring 1.0 cm in maximum diameter.  Left epididymis:  Normal in size and appearance.  Hydrocele:  Small bilateral hydroceles.  Varicocele:  None visualized  Pulsed Doppler interrogation of both testes demonstrates low resistance arterial and venous waveform patterns bilaterally.  IMPRESSION:  1.  No evidence of testicular mass.  No sonographic signs of testicular torsion. 2.  1 cm right epididymal cyst or spermatocele. 3.  Small bilateral hydroceles.   Original Report Authenticated By: Myles Rosenthal, M.D.    [place] Art/ven Flow Abd Pelv Doppler  04/24/2012  *RADIOLOGY REPORT*  Clinical Data:  Testicular pain.  SCROTAL ULTRASOUND DOPPLER ULTRASOUND OF THE TESTICLES  Technique: Complete ultrasound examination of the testicles, epididymis, and other scrotal structures was performed.  Color and spectral Doppler ultrasound were also utilized to evaluate blood flow to the testicles.  Comparison:  None  Findings:  Right testis:  4.5 x 2.6 x 3.3 cm.  No evidence of testicular mass or microlithiasis.  Internal blood flow is seen on color Doppler ultrasound.  Left testis:  4.3 x 2.6 x 3.5 cm.  No evidence of testicular mass or microlithiasis. 1 mcg on color Doppler ultrasound.  Right epididymis:  A small epididymal cyst or spermatocele measuring 1.0 cm in maximum diameter.  Left epididymis:  Normal in size and appearance.  Hydrocele:  Small bilateral hydroceles.  Varicocele:  None visualized  Pulsed Doppler interrogation of both testes  demonstrates low resistance arterial and venous waveform patterns bilaterally.  IMPRESSION:  1.  No evidence of testicular mass.  No sonographic signs of testicular torsion. 2.  1 cm right epididymal cyst or spermatocele. 3.  Small bilateral hydroceles.   Original Report Authenticated By: Myles Rosenthal, M.D.      1. Penis pain   2. Hyperglycemia       MDM  Penis pain for 3 weeks. Denies trauma. No dysuria, hematuria, testicular pain or discharge. No fever abdominal pain.  Penis appears normal on exam. There is no ecchymosis, rash, cellulitis or discharge. No balanitis.  Testicles are nontender, epididymis is nontender bilaterally.  Urinalysis negative. GC chlamydia obtained. CBG 181. Patient states he just drank orange juice.  Patient encouraged to follow  up with urology.        Glynn Octave, MD 04/24/12 [phone]

## 2012-04-24 NOTE — ED Notes (Signed)
CBG 181.  Pt reports eating special K and drinking Orange Juice prior to arrival.

## 2012-04-24 NOTE — ED Notes (Signed)
Patient c/o penis pain, no pain when urinating, hurts to touch, continuous discomfort, no discharge

## 2012-04-25 ENCOUNTER — Telehealth: Payer: Self-pay | Admitting: Internal Medicine

## 2012-04-25 NOTE — Telephone Encounter (Signed)
Spoke with pt and notified of recs per MW He verbalized understanding Nothing further needed

## 2012-04-25 NOTE — Telephone Encounter (Signed)
Pt dropped off an empty bottle of red yeast rice capsules Wants to know if he should be taking this med Also, he attached a picture of a bottle super omega 3 and wants to know should he take this Please advise, thanks

## 2012-04-25 NOTE — Telephone Encounter (Signed)
i rec two servings of salmon weekly, that is all  There is no proven benefit, and some risk, in taking alternative rx

## 2012-04-29 ENCOUNTER — Telehealth: Payer: Self-pay | Admitting: Internal Medicine

## 2012-04-29 DIAGNOSIS — N4889 Other specified disorders of penis: Secondary | ICD-10-CM | POA: Insufficient documentation

## 2012-04-29 DIAGNOSIS — N4340 Spermatocele of epididymis, unspecified: Secondary | ICD-10-CM | POA: Insufficient documentation

## 2012-04-29 NOTE — Telephone Encounter (Signed)
I spoke with pt and he is wanting a referral to Dr. Colon Branch urologists at [facility] for second opinion. Please advise if okay to do so. thanks

## 2012-04-29 NOTE — Telephone Encounter (Signed)
Spoke with patient aware orders have been placed for referral. Nothing further needed at this time.

## 2012-04-29 NOTE — Telephone Encounter (Signed)
Fine with me

## 2012-07-21 ENCOUNTER — Encounter: Payer: Self-pay | Admitting: Gastroenterology

## 2012-08-24 ENCOUNTER — Other Ambulatory Visit (HOSPITAL_COMMUNITY): Payer: Self-pay | Admitting: Orthopedic Surgery

## 2012-08-24 DIAGNOSIS — M7989 Other specified soft tissue disorders: Secondary | ICD-10-CM

## 2012-08-24 DIAGNOSIS — M79662 Pain in left lower leg: Secondary | ICD-10-CM

## 2012-08-25 ENCOUNTER — Ambulatory Visit (HOSPITAL_COMMUNITY)
Admission: RE | Admit: 2012-08-25 | Discharge: 2012-08-25 | Disposition: A | Discharge status: Home / Self Care | Payer: BC Managed Care – PPO | Source: Ambulatory Visit | Attending: Orthopedic Surgery | Admitting: Orthopedic Surgery

## 2012-08-25 DIAGNOSIS — M79609 Pain in unspecified limb: Secondary | ICD-10-CM

## 2012-08-25 DIAGNOSIS — M7989 Other specified soft tissue disorders: Secondary | ICD-10-CM | POA: Insufficient documentation

## 2012-08-25 DIAGNOSIS — M79662 Pain in left lower leg: Secondary | ICD-10-CM

## 2012-08-25 NOTE — Progress Notes (Signed)
Left lower extremity venous duplex completed.  Left:  No evidence of DVT, superficial thrombosis, or Baker's cyst.  Right:  Negative for DVT in the common femoral vein.  

## 2012-09-12 ENCOUNTER — Ambulatory Visit (AMBULATORY_SURGERY_CENTER): Payer: BC Managed Care – PPO | Admitting: *Deleted

## 2012-09-12 ENCOUNTER — Encounter: Payer: Self-pay | Admitting: Gastroenterology

## 2012-09-12 VITALS — Ht 70.0 in | Wt 183.6 lb

## 2012-09-12 DIAGNOSIS — Z1211 Encounter for screening for malignant neoplasm of colon: Secondary | ICD-10-CM

## 2012-09-12 MED ORDER — MOVIPREP 100 G PO SOLR: ORAL | Status: DC

## 2012-09-23 ENCOUNTER — Encounter: Payer: Self-pay | Admitting: Internal Medicine

## 2012-09-23 ENCOUNTER — Ambulatory Visit (INDEPENDENT_AMBULATORY_CARE_PROVIDER_SITE_OTHER): Payer: BC Managed Care – PPO | Admitting: Internal Medicine

## 2012-09-23 VITALS — BP 136/86 | HR 56 | Temp 97.7°F | Ht 70.0 in | Wt 178.0 lb

## 2012-09-23 DIAGNOSIS — H6123 Impacted cerumen, bilateral: Secondary | ICD-10-CM

## 2012-09-23 DIAGNOSIS — I10 Essential (primary) hypertension: Secondary | ICD-10-CM

## 2012-09-23 DIAGNOSIS — J309 Allergic rhinitis, unspecified: Secondary | ICD-10-CM

## 2012-09-23 DIAGNOSIS — H6120 Impacted cerumen, unspecified ear: Secondary | ICD-10-CM

## 2012-09-23 NOTE — Patient Instructions (Addendum)
Ear wax removed otc 3 nights in a row should clear it otherwise return to see Tammy NP for ear wax removal

## 2012-09-23 NOTE — Progress Notes (Signed)
Subjective:    Patient ID: Richard Roach, male    DOB: [birth year]    MRN: [id number]  HPI   65 yowm with minimum smoking history and documented chronic rhinitis with allergies to dust and mold cats with a recurrent pattern of nonspecific rhinitis triggered by viral upper respiratory infections or airplane travel not compliant with nasal steroids.       04/18/2012  CPX  With multiple chronic concerns 1) tingling mid back, did not discuss this with neurology on recent eval 2) timing for colonoscopy for polyps > in computer for recall 3) urinary hesistancy, did not discuss this with  urology 4) freq uri/ rhinitis/sinusitis ,  Not maintaining on nasonex, did not remember instructions re appropriate use of afrin x 5 days only rec Stay on nasal steroids, avoid antihistamines, f/u with urology prn  09/24/2012  Acute ov/Alonnie Bieker re new symptoms Chief Complaint  Patient presents with  . Acute Visit    Pt c/o "crackling sound" in left ear x 2 wks.    did not use nasal steroids or afrin, feels ears pop and crackle when yawns but no problem flying commercially as recently as one day prior to OV  - no ear pain, loss of hearing or obvious nasal congestion  No obvious daytime variabilty or assoc chronic cough or cp or chest tightness, subjective wheeze overt sinus or hb symptoms. No unusual exp hx or h/o childhood pna/ asthma or premature birth to his knowledge.   Sleeping ok without nocturnal  or early am exacerbation  of respiratory  c/o's or need for noct saba. Also denies any obvious fluctuation of symptoms with weather or environmental changes or other aggravating or alleviating factors except as outlined above   ROS  The following are not active complaints unless bolded sore throat, dysphagia, dental problems, itching, sneezing,  nasal congestion or excess/ purulent secretions, ear ache,   fever, chills, sweats, unintended wt loss, pleuritic or exertional cp, hemoptysis,  orthopnea pnd or leg swelling,  presyncope, palpitations, heartburn, abdominal pain, anorexia, nausea, vomiting, diarrhea  or change in bowel or urinary habits, change in stools or urine, dysuria,hematuria,  rash, arthralgias, visual complaints, headache, numbness weakness or ataxia or problems with walking or coordination,  change in mood/affect or memory.          .  Past Medical History:  HYPERCHOLESTEROLEMIA (ICD-272.0)  - Target LDL < 130 (Type A plus male gender)  ALLERGIC RHINITIS, CHRONIC (ICD-477.9)  - Chronic rhinitis flyers reviewed 05/27/99, 05/16/02, 12/23/04 and 03/11/06  - Pt denied ever receiving a chronic rhinitis flyer July 15, 2009, not using it March 26, 2010  - Chronic rhinitis flyers reviewed 05/27/99, 05/16/02, 12/23/04 and 03/11/06  - Pt denied ever receiving a chronic rhinitis flyer July 15, 2009, not using it March 26, 2010  BPH ....................................................................... [name] (ICD-401.9)  Colon Polyps  - See Colonoscopy 09/19/2007 .................................  [facility]  Health Maintenance......................................................Marland KitchenWert  - Td 09/2007  - Pneumovax 05/2003 (second shot)  - CPX 04/18/2012     Family History:  Asthma/emphysema in his father died in [place] at 28  negative for cancer  mother healthy  older brother by 11 years and healthy    Social History:  Former smoker. Quit in 2000,  Only ever socially.  married  2 children  Sales, freq flyer  ETOH- daily, wine              Objective:   Physical Exam  robust   ambulatory white male in  no acute distress jumping from one concern to the other  wt 190 April 30, 2009 > 190 July 15, 2009 >  > 183 May 02, 2010 > 186 05/07/2011 >09/07/2011 183 >>182 09/18/2011  > 04/18/2012 183 > 178 09/23/12 HEENT: nl dentition, wax  both ears impacted with wax, mod turbinate edema, non-specific Neck without JVD/Nodes/TM nl carotids without bruit  Lungs clear to A and P  bilaterally without cough on insp or exp maneuvers  RRR no s3 or murmur or increase in P2 no displacement pmi  Abd soft and benign with nl excursion in the supine position. No bruits or organomegaly  Ext warm without calf tenderness, cyanosis clubbing or edema, nl pulses  Neuro alert, anxious no deficits  MS nl gait, no restrictions  Skin no lesions      CXR  04/18/2012 :    Normal except for some aortic atherosclerotic calcification.      Assessment & Plan:

## 2012-09-24 DIAGNOSIS — H6121 Impacted cerumen, right ear: Secondary | ICD-10-CM | POA: Insufficient documentation

## 2012-09-24 NOTE — Assessment & Plan Note (Signed)
Adequate control on present rx, reviewed  

## 2012-09-24 NOTE — Assessment & Plan Note (Signed)
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 Chronic right maxillary sinusitis changes with slight mucosal thickening which also involves the maxillary sinus ostium and infundibulum   - Consistently inconsistent with ICS documented 12/05/10  And 04/18/12  Most likely this "new symptom" of "L ear crackling"  is either eustachian tube dysfunction or related to wax, both which have been addressed multiple times in the past. He simply doesn't appear to be able to process the need for a maint rx and a prn "action plan" to control his chronic problem with nasal inflammation and the various syndromes/ symptoms this results is.    Each maintenance medication was reviewed in detail including most importantly the difference between maintenance and as needed and under what circumstances the prns are to be used.  Please see instructions for details which were reviewed in writing and the patient given a copy.

## 2012-09-24 NOTE — Assessment & Plan Note (Signed)
rec otc's for at least 3 days then return to see Tammy NP for irrigation

## 2012-09-26 ENCOUNTER — Telehealth: Payer: Self-pay | Admitting: Internal Medicine

## 2012-09-26 DIAGNOSIS — B351 Tinea unguium: Secondary | ICD-10-CM

## 2012-09-26 DIAGNOSIS — R202 Paresthesia of skin: Secondary | ICD-10-CM

## 2012-09-26 DIAGNOSIS — R200 Anesthesia of skin: Secondary | ICD-10-CM

## 2012-09-26 NOTE — Telephone Encounter (Signed)
[facility] for skin  Neurology for back tingling - Dr Anne Hahn' group

## 2012-09-26 NOTE — Telephone Encounter (Signed)
Both referrals sent to [facility] Pt aware and states nothing further needed

## 2012-09-26 NOTE — Telephone Encounter (Signed)
I spoke with pt. He is asking to be referred to someone for tingling/numbness in his back that has been going on x 2 years now. Also wants a referral for ingrown toenail/foot fungus. Please advise MW thanks

## 2012-09-27 ENCOUNTER — Encounter: Payer: Self-pay | Admitting: Gastroenterology

## 2012-09-27 ENCOUNTER — Other Ambulatory Visit: Payer: Self-pay

## 2012-09-27 ENCOUNTER — Ambulatory Visit (AMBULATORY_SURGERY_CENTER): Payer: BC Managed Care – PPO | Admitting: Gastroenterology

## 2012-09-27 VITALS — BP 124/77 | HR 49 | Temp 97.2°F | Resp 21 | Ht 70.0 in | Wt 183.0 lb

## 2012-09-27 DIAGNOSIS — D126 Benign neoplasm of colon, unspecified: Secondary | ICD-10-CM

## 2012-09-27 DIAGNOSIS — Z860100 Personal history of colon polyps, unspecified: Secondary | ICD-10-CM

## 2012-09-27 DIAGNOSIS — Z1211 Encounter for screening for malignant neoplasm of colon: Secondary | ICD-10-CM

## 2012-09-27 MED ORDER — SODIUM CHLORIDE 0.9 % IV SOLN: 500.0000 mL | INTRAVENOUS | Status: DC

## 2012-09-27 NOTE — Progress Notes (Signed)
Called to room to assist during endoscopic procedure.  Patient ID and intended procedure confirmed with present staff. Received instructions for my participation in the procedure from the performing physician.  

## 2012-09-27 NOTE — Op Note (Signed)
[facility] [address] [place], [address]   COLONOSCOPY PROCEDURE REPORT  PATIENT: Richard Roach, Richard Roach  MR#: [id number] BIRTHDATE: [birth year] , 56  yrs. old GENDER: Male ENDOSCOPIST: Meryl Dare, MD, [facility] PROCEDURE DATE:  09/27/2012 PROCEDURE:   Colonoscopy with snare polypectomy ASA CLASS:   Class II INDICATIONS:Patient's personal history of adenomatous colon polyps.  MEDICATIONS: MAC sedation, administered by CRNA and propofol (Diprivan) 400mg  IV DESCRIPTION OF PROCEDURE:   After the risks benefits and alternatives of the procedure were thoroughly explained, informed consent was obtained.  A digital rectal exam revealed no abnormalities of the rectum.   The LB [id number]  endoscope was introduced through the anus and advanced to the cecum, which was identified by both the appendix and ileocecal valve. No adverse events experienced.   The quality of the prep was excellent, using MoviPrep  The instrument was then slowly withdrawn as the colon was fully examined.  COLON FINDINGS: A sessile polyp measuring 6 mm in size was found in the transverse colon.  A polypectomy was performed with a cold snare.  The resection was complete and the polyp tissue was completely retrieved.  A sessile polyp measuring 5 mm in size was found in the sigmoid colon.  A polypectomy was performed with a cold snare.  The resection was complete and the polyp tissue was completely retrieved.   The colon was otherwise normal.  There was no diverticulosis, inflammation, polyps or cancers unless previously stated.  Retroflexed views revealed small internal hemorrhoids. The time to cecum=2 minutes 05 seconds.  Withdrawal time=11 minutes 54 seconds.  The scope was withdrawn and the procedure completed.  COMPLICATIONS: There were no complications.  ENDOSCOPIC IMPRESSION: 1.   Sessile polyp measuring 6 mm in the transverse colon; polypectomy performed with a cold snare; 2.   Sessile  polyp measuring 5 mm in the sigmoid colon; polypectomy performed with a cold snare 3.   Small internal hemorrhoids  RECOMMENDATIONS: 1.  Await pathology results 2.  Repeat Colonoscopy in 5 years.  eSigned:  Meryl Dare, MD, [facility] 09/27/2012 9:00 AM

## 2012-09-27 NOTE — Progress Notes (Signed)
Patient did not experience any of the following events: a burn prior to discharge; a fall within the facility; wrong site/side/patient/procedure/implant event; or a hospital transfer or hospital admission upon discharge from the facility. ([id number])Patient did not experience any of the following events: a burn prior to discharge; a fall within the facility; wrong site/side/patient/procedure/implant event; or a hospital transfer or hospital admission upon discharge from the facility. [phone])

## 2012-09-27 NOTE — Patient Instructions (Addendum)

## 2012-09-28 ENCOUNTER — Telehealth: Payer: Self-pay | Admitting: *Deleted

## 2012-09-28 NOTE — Telephone Encounter (Signed)
  Follow up Call-  Call back number 09/27/2012  Post procedure Call Back phone  # [phone]  Permission to leave phone message Yes     Patient questions:  Do you have a fever, pain , or abdominal swelling? no Pain Score  0 *  Have you tolerated food without any problems? yes  Have you been able to return to your normal activities? yes  Do you have any questions about your discharge instructions: Diet   no Medications  no Follow up visit  no  Do you have questions or concerns about your Care? no  Actions: * If pain score is 4 or above: No action needed, pain <4.

## 2012-09-30 ENCOUNTER — Ambulatory Visit (INDEPENDENT_AMBULATORY_CARE_PROVIDER_SITE_OTHER): Payer: BC Managed Care – PPO | Admitting: Neurology

## 2012-09-30 ENCOUNTER — Encounter: Payer: Self-pay | Admitting: Neurology

## 2012-09-30 VITALS — BP 134/85 | HR 41 | Temp 96.2°F | Ht 70.0 in | Wt 176.0 lb

## 2012-09-30 DIAGNOSIS — R209 Unspecified disturbances of skin sensation: Secondary | ICD-10-CM

## 2012-09-30 DIAGNOSIS — R200 Anesthesia of skin: Secondary | ICD-10-CM

## 2012-09-30 NOTE — Progress Notes (Signed)
HPI; Richard Roach is a 57 years old right-handed Caucasian male, referred by his primary care physician Dr. Sherene Sires for evaluation of intermittent left upper thoracic area numbness  He had a past medical history of hypertension, hyperlipidemia, recent biopsy of a small mole at left upper thoracic region  Overpass one year, without trigger event, he noticed intermittent numbness-tingling sensation at left upper thoracic region, around left T. 7 or 8 myotomes,palm size, there is no rash broke out, it was intermittent, only present 5% of the time, getting more obvious when he mows the yard, or doing other housework around his house,  He denies gait difficulty no bowel bladder incontinence,   Review of Systems  Out of a complete 14 system review, the patient complains of only the following symptoms, and all other reviewed systems are negative.   Constitutional:   N/A Cardiovascular:  N/A Ear/Nose/Throat:  N/A Skin: N/A Eyes: N/A Respiratory: N/A Gastroitestinal: N/A    Hematology/Lymphatic:  N/A Endocrine:  N/A Musculoskeletal:N/A Allergy/Immunology: N/A Neurological: N/A Psychiatric:    N/A  PHYSICAL EXAMINATOINS:  Generalized: In no acute distress  Neck: Supple, no carotid bruits   Cardiac: Regular rate rhythm  Pulmonary: Clear to auscultation bilaterally  Musculoskeletal: No deformity  Neurological examination  Mentation: Alert oriented to time, place, history taking, and causual conversation  Cranial nerve II-XII: Pupils were equal round reactive to light extraocular movements were full, visual field were full on confrontational test. facial sensation and strength were normal. hearing was intact to finger rubbing bilaterally. Uvula tongue midline.  head turning and shoulder shrug and were normal and symmetric.Tongue protrusion into cheek strength was normal.  Motor: normal tone, bulk and strength.  Sensory: Intact to fine touch, pinprick, preserved vibratory sensation, and  proprioception at toes. Right upper thoracic small mole, with mark of recent biopsy  Coordination: Normal finger to nose, heel-to-shin bilaterally there was no truncal ataxia  Gait: Rising up from seated position without assistance, normal stance, without trunk ataxia, moderate stride, good arm swing, smooth turning, able to perform tiptoe, and heel walking without difficulty.   Romberg signs: Negative  Deep tendon reflexes: Brachioradialis 2/2, biceps 2/2, triceps 2/2, patellar 2/2, Achilles 2/2, plantar responses were flexor bilaterally.  Assessment and plan: 57 years old right-handed Caucasian male, was intermittentleft upper thoracic area numbness, normal neurological examination  Unsure etiology, I have advised him to continue to observe his symptoms, there was no evidence of thoracic cord lesion or thoracic radiculopathy at this point, return to clinic as needed,

## 2012-10-04 ENCOUNTER — Encounter: Payer: Self-pay | Admitting: Gastroenterology

## 2012-10-11 ENCOUNTER — Telehealth: Payer: Self-pay | Admitting: Gastroenterology

## 2012-10-11 ENCOUNTER — Telehealth (INDEPENDENT_AMBULATORY_CARE_PROVIDER_SITE_OTHER): Payer: Self-pay

## 2012-10-11 NOTE — Telephone Encounter (Addendum)
Patient has not been seen since 12/2010 - he is coming in for groin pain. Dr Jamey Ripa please advise.

## 2012-10-11 NOTE — Telephone Encounter (Signed)
Patient states he would like a MRI to be done prior to coming in for his OV 10-21-12. He is having dull groin pain Please Advise

## 2012-10-11 NOTE — Telephone Encounter (Signed)
Called patient and made him aware we would not order imaging prior to seeing him. He will keep his follow up appt with Dr Jamey Ripa.

## 2012-10-11 NOTE — Telephone Encounter (Signed)
I can't order an MRI without seeing him and probably would not anyway. Has he seen his primary care physician yet to see if he has a hernia?

## 2012-10-11 NOTE — Telephone Encounter (Signed)
Patient reports that he has pain that started approx a week ago RLQ dull ache that comes and goes.  He denies any other symptoms.  His colonoscopy was 09/27/12 and he had 2 polyps removed with cold snare from transverse colon and the sigmoid colon.  He notes a right inguinal hernia that "was too small to do anything about".  He is in [place] for the week, I have advised him the pain is unlikely to be related to the colonoscopy and he should be  evaluated there at an Urgent care or MD office.

## 2012-10-11 NOTE — Telephone Encounter (Signed)
I agree

## 2012-10-17 ENCOUNTER — Telehealth: Payer: Self-pay | Admitting: Adult Health

## 2012-10-17 NOTE — Telephone Encounter (Signed)
Per MW last OV note. If pt was still having sounds in ear to schedule appt with TP for ear wax removal. I have done so. Nothing further was needed

## 2012-10-20 ENCOUNTER — Other Ambulatory Visit: Payer: Self-pay | Admitting: Internal Medicine

## 2012-10-21 ENCOUNTER — Ambulatory Visit (INDEPENDENT_AMBULATORY_CARE_PROVIDER_SITE_OTHER): Payer: BC Managed Care – PPO | Admitting: Surgery

## 2012-10-21 ENCOUNTER — Encounter (INDEPENDENT_AMBULATORY_CARE_PROVIDER_SITE_OTHER): Payer: Self-pay | Admitting: Surgery

## 2012-10-21 VITALS — BP 118/70 | HR 62 | Temp 98.0°F | Resp 16 | Ht 70.0 in | Wt 177.8 lb

## 2012-10-21 DIAGNOSIS — R1031 Right lower quadrant pain: Secondary | ICD-10-CM

## 2012-10-21 NOTE — Progress Notes (Signed)
NAME: Richard Roach       DOB: [birth year]           DATE: 10/21/2012       AVW:[id number]  CC:  Chief Complaint  Patient presents with  . New Evaluation    RIH new pain    HPI: I saw this patient a few years ago for a possible right inguinal hernia but could not find one on physical examination. A CT scan did show a tiny hernia on the left but nothing on the right. He was noted to have small hydroceles bilaterally and a small ependymal cyst on the right but is asymptomatic. Recently had increasing pain in the right inguinal area very medially, at about the level of the pubic tubercle. He has not seen a bulge. He has not had any urinary symptoms.the symptoms have actually improved over the last week.  EXAM: Vital signs: BP 118/70  Pulse 62  Temp(Src) 98 F (36.7 C) (Temporal)  Resp 16  Ht 5\' 10"  (1.778 m)  Wt 177 lb 12.8 oz (80.65 kg)  BMI 25.51 kg/m2  General: Patient alert, oriented, NAD  Abdomen: Soft and completely benign. He is a little bit tender at the level of pubic tubercle on his right spermatic cord, the left. There is no evidence of an inguinal hernia on either side.  GU: I do not feel any abnormality of either testis. IMP: right spermatic cord tenderness of uncertain etiology, improving; no clinical evidence of an inguinal hernia on either side.  PLAN: I told him that since his symptoms are getting better I didn't think any further diagnostic tests are appropriate at this time but if he gets worse we would need to reexamine him and consider whether another CT scan would be helpful. However I do think this is not related to a hernia  Wilferd Ritson J 10/21/2012

## 2012-10-21 NOTE — Patient Instructions (Signed)
I think you had some inflammation of your right spermatic cord. I do not feel a hernia. theophylline small hernia seen on a CT scan a few years ago was on the LEFT. Your scrotal ultrasound showed small hyrdrocele and that may have been the cause of your symptoms, but unless they get worse, nothing needs to be done at this time

## 2012-11-04 ENCOUNTER — Encounter: Payer: Self-pay | Admitting: Adult Health

## 2012-11-04 ENCOUNTER — Ambulatory Visit (INDEPENDENT_AMBULATORY_CARE_PROVIDER_SITE_OTHER): Payer: BC Managed Care – PPO | Admitting: Adult Health

## 2012-11-04 VITALS — BP 130/66 | HR 52 | Temp 97.0°F | Ht 70.0 in | Wt 178.0 lb

## 2012-11-04 DIAGNOSIS — H6123 Impacted cerumen, bilateral: Secondary | ICD-10-CM

## 2012-11-04 DIAGNOSIS — H6120 Impacted cerumen, unspecified ear: Secondary | ICD-10-CM

## 2012-11-04 NOTE — Patient Instructions (Addendum)
Claritin daily As needed  Drainage  Debrox As needed  For ear wax As needed   follow up As needed

## 2012-11-04 NOTE — Assessment & Plan Note (Signed)
Bilateral ear wax impaction R>L  Ear irrigation w/ wax removal  EAC clear   Plan  Debrox As needed

## 2012-11-04 NOTE — Progress Notes (Signed)
Subjective:    Patient ID: Richard Roach, male    DOB: [birth year]    MRN: [id number]  HPI   60 yowm with minimum smoking history and documented chronic rhinitis with allergies to dust and mold cats with a recurrent pattern of nonspecific rhinitis triggered by viral upper respiratory infections or airplane travel not compliant with nasal steroids.       04/18/2012  CPX  With multiple chronic concerns 1) tingling mid back, did not discuss this with neurology on recent eval 2) timing for colonoscopy for polyps > in computer for recall 3) urinary hesistancy, did not discuss this with  urology 4) freq uri/ rhinitis/sinusitis ,  Not maintaining on nasonex, did not remember instructions re appropriate use of afrin x 5 days only rec Stay on nasal steroids, avoid antihistamines, f/u with urology prn  09/24/2012  Acute ov/Wert re new symptoms Chief Complaint  Patient presents with  . Acute Visit    Pt c/o "crackling sound" in left ear x 2 wks.    did not use nasal steroids or afrin, feels ears pop and crackle when yawns but no problem flying commercially as recently as one day prior to OV  - no ear pain, loss of hearing or obvious nasal congestion >>debrox   11/04/2012 Acute OV  Complains of bilateral ear wash. Ears stopped . otc ear wax products not working Decreased hearing.  No pain or drainage Using qtips at home flys a lot.    ROS  The following are not active complaints unless bolded sore throat, dysphagia, dental problems, itching, sneezing,  nasal congestion or excess/ purulent secretions, ear ache,   fever, chills, sweats, unintended wt loss, pleuritic or exertional cp, hemoptysis,  orthopnea pnd or leg swelling, presyncope, palpitations, heartburn, abdominal pain, anorexia, nausea, vomiting, diarrhea  or change in bowel or urinary habits, change in stools or urine, dysuria,hematuria,  rash, arthralgias, visual complaints, headache, numbness weakness or ataxia or problems with walking  or coordination,  change in mood/affect or memory.          .  Past Medical History:  HYPERCHOLESTEROLEMIA (ICD-272.0)  - Target LDL < 130 (Type A plus male gender)  ALLERGIC RHINITIS, CHRONIC (ICD-477.9)  - Chronic rhinitis flyers reviewed 05/27/99, 05/16/02, 12/23/04 and 03/11/06  - Pt denied ever receiving a chronic rhinitis flyer July 15, 2009, not using it March 26, 2010  - Chronic rhinitis flyers reviewed 05/27/99, 05/16/02, 12/23/04 and 03/11/06  - Pt denied ever receiving a chronic rhinitis flyer July 15, 2009, not using it March 26, 2010  BPH ....................................................................... [name] (ICD-401.9)  Colon Polyps  - See Colonoscopy 09/19/2007 .................................  [facility]  Health Maintenance......................................................Marland KitchenWert  - Td 09/2007  - Pneumovax 05/2003 (second shot)  - CPX 04/18/2012     Family History:  Asthma/emphysema in his father died in [place] at 69  negative for cancer  mother healthy  older brother by 11 years and healthy    Social History:  Former smoker. Quit in 2000,  Only ever socially.  married  2 children  Sales, freq flyer  ETOH- daily, wine              Objective:   Physical Exam  robust   ambulatory white male in no acute distress jumping from one concern to the other  wt 190 April 30, 2009 > 190 July 15, 2009 >  > 183 May 02, 2010 > 186 05/07/2011 >09/07/2011 183 >>182 09/18/2011  > 04/18/2012 183 > 178 09/23/12>178 11/04/2012  HEENT: nl dentition, wax  both ears impacted with wax, mod turbinate edema, non-specific Neck without JVD/Nodes/TM nl carotids without bruit  Lungs clear to A and P bilaterally without cough on insp or exp maneuvers  RRR no s3 or murmur or increase in P2 no displacement pmi  Abd soft and benign with nl excursion in the supine position. No bruits or organomegaly  Ext warm without calf tenderness, cyanosis clubbing or edema, nl  pulses  Neuro alert, anxious no deficits  MS nl gait, no restrictions  Skin no lesions      CXR  04/18/2012 :    Normal except for some aortic atherosclerotic calcification.      Assessment & Plan:

## 2013-04-16 ENCOUNTER — Emergency Department (HOSPITAL_COMMUNITY): Payer: BC Managed Care – PPO

## 2013-04-16 ENCOUNTER — Emergency Department (HOSPITAL_COMMUNITY)
Admission: EM | Admit: 2013-04-16 | Discharge: 2013-04-16 | Disposition: A | Discharge status: Home / Self Care | Payer: BC Managed Care – PPO | Attending: Emergency Medicine | Admitting: Emergency Medicine

## 2013-04-16 ENCOUNTER — Encounter (HOSPITAL_COMMUNITY): Payer: Self-pay | Admitting: Emergency Medicine

## 2013-04-16 ENCOUNTER — Other Ambulatory Visit: Payer: Self-pay

## 2013-04-16 DIAGNOSIS — R002 Palpitations: Secondary | ICD-10-CM | POA: Insufficient documentation

## 2013-04-16 DIAGNOSIS — Z8639 Personal history of other endocrine, nutritional and metabolic disease: Secondary | ICD-10-CM | POA: Insufficient documentation

## 2013-04-16 DIAGNOSIS — Z862 Personal history of diseases of the blood and blood-forming organs and certain disorders involving the immune mechanism: Secondary | ICD-10-CM | POA: Insufficient documentation

## 2013-04-16 DIAGNOSIS — B760 Ancylostomiasis: Secondary | ICD-10-CM | POA: Insufficient documentation

## 2013-04-16 DIAGNOSIS — Z860100 Personal history of colon polyps, unspecified: Secondary | ICD-10-CM | POA: Insufficient documentation

## 2013-04-16 DIAGNOSIS — Z87891 Personal history of nicotine dependence: Secondary | ICD-10-CM | POA: Insufficient documentation

## 2013-04-16 DIAGNOSIS — Z79899 Other long term (current) drug therapy: Secondary | ICD-10-CM | POA: Insufficient documentation

## 2013-04-16 DIAGNOSIS — I10 Essential (primary) hypertension: Secondary | ICD-10-CM | POA: Insufficient documentation

## 2013-04-16 DIAGNOSIS — B761 Necatoriasis: Secondary | ICD-10-CM | POA: Insufficient documentation

## 2013-04-16 DIAGNOSIS — Z7982 Long term (current) use of aspirin: Secondary | ICD-10-CM | POA: Insufficient documentation

## 2013-04-16 LAB — CBC WITH DIFFERENTIAL/PLATELET
Basophils Absolute: 0 10*3/uL (ref 0.0–0.1)
Basophils Relative: 0 % (ref 0–1)
Eosinophils Absolute: 0.2 10*3/uL (ref 0.0–0.7)
Eosinophils Relative: 3 % (ref 0–5)
HCT: 44.2 % (ref 39.0–52.0)
Hemoglobin: 15.8 g/dL (ref 13.0–17.0)
MCH: 31.7 pg (ref 26.0–34.0)
MCHC: 35.7 g/dL (ref 30.0–36.0)
MCV: 88.6 fL (ref 78.0–100.0)
Monocytes Absolute: 0.8 10*3/uL (ref 0.1–1.0)
Monocytes Relative: 10 % (ref 3–12)
Neutro Abs: 4 10*3/uL (ref 1.7–7.7)

## 2013-04-16 LAB — POCT I-STAT, CHEM 8
Calcium, Ion: 1.19 mmol/L (ref 1.12–1.23)
Chloride: 101 mEq/L (ref 96–112)
Creatinine, Ser: 1.1 mg/dL (ref 0.50–1.35)
Glucose, Bld: 153 mg/dL — ABNORMAL HIGH (ref 70–99)
HCT: 48 % (ref 39.0–52.0)
Hemoglobin: 16.3 g/dL (ref 13.0–17.0)

## 2013-04-16 LAB — POCT I-STAT TROPONIN I: Troponin i, poc: 0.01 ng/mL (ref 0.00–0.08)

## 2013-04-16 NOTE — ED Notes (Signed)
Pt was in [facility] approx 30 min ago and started having a ":funny feeling " in his chest states that it is intetrmit and has a funny feeling in his arms. Diaphoretic at this time. Pain began without exertion. HX htn,

## 2013-04-16 NOTE — ED Provider Notes (Signed)
CSN: [id number]     Arrival date & time 04/16/13  1251 History   First MD Initiated Contact with Patient 04/16/13 1304     Chief Complaint  Patient presents with  . Chest Pain   (Consider location/radiation/quality/duration/timing/severity/associated sxs/prior Treatment) HPI Comments: Patient presents with heart palpitations. He states he was in line to get lunch it can W. he started noticing some palpitations. He denies any associated chest pain or tightness. There is no shortness of breath. He denies any nausea or vomiting. He denies any leg pain or swelling. He denies any cough or chest congestion. He denies any recent fevers cough congestion or other recent illnesses. He denies a past history of heart problems. He does have a history of hypertension and hyperlipidemia.  Patient is a 57 y.o. male presenting with chest pain.  Chest Pain Associated symptoms: palpitations   Associated symptoms: no abdominal pain, no back pain, no cough, no diaphoresis, no dizziness, no fatigue, no fever, no headache, no nausea, no numbness, no shortness of breath, not vomiting and no weakness     Past Medical History  Diagnosis Date  . Hypercholesterolemia   . Allergic rhinitis   . Hypertension   . Colon polyps    Past Surgical History  Procedure Laterality Date  . Knee arthroscopy  June 2011    right  . Colonoscopy     Family History  Problem Relation Age of Onset  . Asthma Father   . Colon cancer Neg Hx   . High blood pressure Mother    History  Substance Use Topics  . Smoking status: Former Smoker    Types: Cigarettes    Quit date: 05/11/1998  . Smokeless tobacco: Never Used     Comment: smoked only socially x 5 yrs  . Alcohol Use: 12.6 oz/week    21 Glasses of wine per week    Review of Systems  Constitutional: Negative for fever, chills, diaphoresis and fatigue.  HENT: Negative for congestion, rhinorrhea and sneezing.   Eyes: Negative.   Respiratory: Negative for cough, chest  tightness and shortness of breath.   Cardiovascular: Positive for palpitations. Negative for chest pain and leg swelling.  Gastrointestinal: Negative for nausea, vomiting, abdominal pain, diarrhea and blood in stool.  Genitourinary: Negative for frequency, hematuria, flank pain and difficulty urinating.  Musculoskeletal: Negative for arthralgias and back pain.  Skin: Negative for rash.  Neurological: Negative for dizziness, speech difficulty, weakness, numbness and headaches.    Allergies  Review of patient's allergies indicates no known allergies.  Home Medications   Current Outpatient Rx  Name  Route  Sig  Dispense  Refill  . Ascorbic Acid (VITAMIN C) 1000 MG tablet   Oral   Take 1,000 mg by mouth daily.         Marland Kitchen aspirin 81 MG tablet   Oral   Take 81 mg by mouth daily.         Marland Kitchen atenolol (TENORMIN) 50 MG tablet   Oral   Take 50 mg by mouth daily.         . Multiple Vitamin (MULTIVITAMIN) tablet   Oral   Take 1 tablet by mouth daily.         . vitamin E 200 UNIT capsule   Oral   Take 200 Units by mouth daily.          BP 159/81  Pulse 62  Temp(Src) 98.5 F (36.9 C) (Oral)  Resp 17  SpO2 98% Physical Exam  Constitutional: He is oriented to person, place, and time. He appears well-developed and well-nourished.  HENT:  Head: Normocephalic and atraumatic.  Eyes: Pupils are equal, round, and reactive to light.  Neck: Normal range of motion. Neck supple.  Cardiovascular: Normal rate, regular rhythm and normal heart sounds.   Pulmonary/Chest: Effort normal and breath sounds normal. No respiratory distress. He has no wheezes. He has no rales. He exhibits no tenderness.  Abdominal: Soft. Bowel sounds are normal. There is no tenderness. There is no rebound and no guarding.  Musculoskeletal: Normal range of motion. He exhibits no edema.  Lymphadenopathy:    He has no cervical adenopathy.  Neurological: He is alert and oriented to person, place, and time.   Skin: Skin is warm and dry. No rash noted.  Psychiatric: He has a normal mood and affect.    ED Course  Procedures (including critical care time) Labs Review Results for orders placed during the hospital encounter of 04/16/13  CBC WITH DIFFERENTIAL      Result Value Range   WBC 7.6  4.0 - 10.5 K/uL   RBC 4.99  4.22 - 5.81 MIL/uL   Hemoglobin 15.8  13.0 - 17.0 g/dL   HCT 54.0  98.1 - 19.1 %   MCV 88.6  78.0 - 100.0 fL   MCH 31.7  26.0 - 34.0 pg   MCHC 35.7  30.0 - 36.0 g/dL   RDW 47.8  29.5 - 62.1 %   Platelets 227  150 - 400 K/uL   Neutrophils Relative % 53  43 - 77 %   Neutro Abs 4.0  1.7 - 7.7 K/uL   Lymphocytes Relative 34  12 - 46 %   Lymphs Abs 2.6  0.7 - 4.0 K/uL   Monocytes Relative 10  3 - 12 %   Monocytes Absolute 0.8  0.1 - 1.0 K/uL   Eosinophils Relative 3  0 - 5 %   Eosinophils Absolute 0.2  0.0 - 0.7 K/uL   Basophils Relative 0  0 - 1 %   Basophils Absolute 0.0  0.0 - 0.1 K/uL  POCT I-STAT, CHEM 8      Result Value Range   Sodium 139  135 - 145 mEq/L   Potassium 3.7  3.5 - 5.1 mEq/L   Chloride 101  96 - 112 mEq/L   BUN 18  6 - 23 mg/dL   Creatinine, Ser 3.08  0.50 - 1.35 mg/dL   Glucose, Bld 657 (*) 70 - 99 mg/dL   Calcium, Ion 8.46  9.62 - 1.23 mmol/L   TCO2 23  0 - 100 mmol/L   Hemoglobin 16.3  13.0 - 17.0 g/dL   HCT 95.2  84.1 - 32.4 %  POCT I-STAT TROPONIN I      Result Value Range   Troponin i, poc 0.01  0.00 - 0.08 ng/mL   Comment 3            Dg Chest Port 1 View  04/16/2013   CLINICAL DATA:  Rapid heartbeat  EXAM: PORTABLE CHEST - 1 VIEW  COMPARISON:  04/18/2012  FINDINGS: Cardiomediastinal silhouette is stable. No acute infiltrate or pleural effusion. No pulmonary edema. Bony thorax is unremarkable.  IMPRESSION: No active disease.   Electronically Signed   By: Natasha Mead M.D.   On: 04/16/2013 13:31     Imaging Review Dg Chest Port 1 View  04/16/2013   CLINICAL DATA:  Rapid heartbeat  EXAM: PORTABLE CHEST - 1 VIEW  COMPARISON:  04/18/2012   FINDINGS: Cardiomediastinal silhouette is stable. No acute infiltrate or pleural effusion. No pulmonary edema. Bony thorax is unremarkable.  IMPRESSION: No active disease.   Electronically Signed   By: Natasha Mead M.D.   On: 04/16/2013 13:31    EKG Interpretation    Date/Time:  Sunday April 16 2013 12:55:01 EST Ventricular Rate:  62 PR Interval:  174 QRS Duration: 93 QT Interval:  439 QTC Calculation: 446 R Axis:   20 Text Interpretation:  Sinus rhythm Abnormal R-wave progression, early transition No old tracing to compare Confirmed by Ardean Simonich  MD, Lorenso Quirino ([id number]) on 04/16/2013 1:22:04 PM             Date: 04/16/2013  Rate: 69  Rhythm: normal sinus rhythm  QRS Axis: normal  Intervals: normal  ST/T Wave abnormalities: normal  Conduction Disutrbances:none  Narrative Interpretation:   Old EKG Reviewed: unchanged   MDM   1. Palpitations    Patient presents with palpitations. On the monitor he's having occasional PVCs which is associated with his palpitations. He has no associated symptoms of near syncope, chest pain or shortness of breath. The triage nurse noted that he was diaphoretic on arrival however I clarified this and he got a little clammy feeling and anxious but was not actually diaphoretic. He currently is symptom free. I feel that it's okay for him to go home and he can followup in the next couple days with his primary care physician.    Rolan Bucco, MD 04/16/13 [phone]

## 2013-04-16 NOTE — ED Notes (Signed)
States that he was eating lunch at [employer] when he noticed that he was having flutter feelings in his chest. Denies that he felt any sweating or nausea associated with this feeling. States that he did feel some dizziness.

## 2013-04-17 ENCOUNTER — Encounter: Payer: Self-pay | Admitting: Internal Medicine

## 2013-04-17 ENCOUNTER — Ambulatory Visit (INDEPENDENT_AMBULATORY_CARE_PROVIDER_SITE_OTHER): Payer: BC Managed Care – PPO | Admitting: Internal Medicine

## 2013-04-17 VITALS — BP 134/84 | HR 50 | Temp 98.0°F | Ht 70.0 in | Wt 180.0 lb

## 2013-04-17 DIAGNOSIS — I10 Essential (primary) hypertension: Secondary | ICD-10-CM

## 2013-04-17 DIAGNOSIS — R002 Palpitations: Secondary | ICD-10-CM | POA: Insufficient documentation

## 2013-04-17 NOTE — Patient Instructions (Addendum)
Please see patient coordinator before you leave today  to schedule cardiology evaluation for palpitations.  Avoid caffeine   Goal  of exercise is 30 min three x weekly where you are having shortnessof breath but not out of breath or chest pain or palpitations.  Keep appt for CPX

## 2013-04-17 NOTE — Progress Notes (Signed)
Subjective:    Patient ID: Richard Roach, male    DOB: 1956/04/24    MRN: [id number]  HPI   19 yowm with minimum smoking history and documented chronic rhinitis with allergies to dust and mold cats with a recurrent pattern of nonspecific rhinitis triggered by viral upper respiratory infections or airplane travel not compliant with nasal steroids.       04/18/2012  CPX  With multiple chronic concerns 1) tingling mid back, did not discuss this with neurology on recent eval 2) timing for colonoscopy for polyps > in computer for recall 3) urinary hesistancy, did not discuss this with  urology 4) freq uri/ rhinitis/sinusitis ,  Not maintaining on nasonex, did not remember instructions re appropriate use of afrin x 5 days only rec Stay on nasal steroids, avoid antihistamines, f/u with urology prn  09/24/2012  Acute ov/Wert re new symptoms Chief Complaint  Patient presents with  . Acute Visit    Pt c/o "crackling sound" in left ear x 2 wks.    did not use nasal steroids or afrin, feels ears pop and crackle when yawns but no problem flying commercially as recently as one day prior to OV  - no ear pain, loss of hearing or obvious nasal congestion >>debrox   11/04/2012 Acute OV  Complains of bilateral ear wash. Ears stopped . otc ear wax products not working Decreased hearing.  No pain or drainage Using qtips at home flys a lot.  rec Claritin daily As needed  Drainage  Debrox As needed  For ear wax As needed      04/17/2013 f/u ov/Wert re: new palpitations/ pvcs documented in ER  Chief Complaint  Patient presents with  . Follow-up    Pt states went to [facility] ED 04/16/13 for "heart fluttering"- was not admitted.  He states feeling tired today, relates to stress.   not ex regulalry, on atenolol 50 mg and asa daily, no decongestants/ one - 2 coffee and no caff bev thereafter.    Occurred while at lunch > ER with nl trop/ pvc's on monitor but nl ekg      No obvious day to day or daytime  variabilty or assoc chronic cough or cp or chest tightness, subjective wheeze overt sinus or hb symptoms. No unusual exp hx or h/o childhood pna/ asthma or knowledge of premature birth.  Sleeping ok without nocturnal  or early am exacerbation  of respiratory  c/o's or need for noct saba. Also denies any obvious fluctuation of symptoms with weather or environmental changes or other aggravating or alleviating factors except as outlined above   Current Medications, Allergies, Complete Past Medical History, Past Surgical History, Family History, and Social History were reviewed in [employer] record.  ROS  The following are not active complaints unless bolded sore throat, dysphagia, dental problems, itching, sneezing,  nasal congestion or excess/ purulent secretions, ear ache,   fever, chills, sweats, unintended wt loss, pleuritic or exertional cp, hemoptysis,  orthopnea pnd or leg swelling, presyncope, palpitations, heartburn, abdominal pain, anorexia, nausea, vomiting, diarrhea  or change in bowel or urinary habits, change in stools or urine, dysuria,hematuria,  rash, arthralgias, visual complaints, headache, numbness weakness or ataxia or problems with walking or coordination,  change in mood/affect or memory.               .  Past Medical History:  HYPERCHOLESTEROLEMIA (ICD-272.0)  - Target LDL < 130 (Type A plus male gender)  ALLERGIC RHINITIS, CHRONIC (ICD-477.9)  -  Chronic rhinitis flyers reviewed 05/27/99, 05/16/02, 12/23/04 and 03/11/06  - Pt denied ever receiving a chronic rhinitis flyer July 15, 2009, not using it March 26, 2010  - Chronic rhinitis flyers reviewed 05/27/99, 05/16/02, 12/23/04 and 03/11/06  - Pt denied ever receiving a chronic rhinitis flyer July 15, 2009, not using it March 26, 2010  BPH ....................................................................... [name] (ICD-401.9)  Colon Polyps  - See Colonoscopy 09/19/2007  .................................  [facility]  Health Maintenance......................................................Marland KitchenWert  - Td 09/2007  - Pneumovax 05/2003 (second shot)  - CPX 04/18/2012     Family History:  Asthma/emphysema in his father died in [place] at 26  negative for cancer  mother healthy  older brother by 11 years and healthy    Social History:  Former smoker. Quit in 2000,  Only ever socially.  married  2 children  Sales, freq flyer  ETOH- daily, wine              Objective:   Physical Exam  robust   ambulatory white male in no acute distress jumping from one concern to the other  wt 190 April 30, 2009 > 190 July 15, 2009 >  > 183 May 02, 2010 > 186 05/07/2011 >09/07/2011 183 >>182 09/18/2011  > 04/18/2012 183 > 178 09/23/12>178 11/04/2012  HEENT: nl dentition,  Neck without JVD/Nodes/TM nl carotids without bruit  Lungs clear to A and P bilaterally without cough on insp or exp maneuvers  RRR no s3 or murmur or increase in P2 no displacement pmi, no extra beats  Abd soft and benign with nl excursion in the supine position. No bruits or organomegaly  Ext warm without calf tenderness, cyanosis clubbing or edema, nl pulses  Neuro alert, anxious no deficits  MS nl gait, no restrictions  Skin no lesions       ekg 04/17/13 wnl     Assessment & Plan:

## 2013-04-18 ENCOUNTER — Encounter: Payer: Self-pay | Admitting: Cardiology

## 2013-04-18 ENCOUNTER — Ambulatory Visit (INDEPENDENT_AMBULATORY_CARE_PROVIDER_SITE_OTHER): Payer: BC Managed Care – PPO | Admitting: Cardiology

## 2013-04-18 VITALS — BP 132/80 | HR 76 | Ht 70.0 in | Wt 180.0 lb

## 2013-04-18 DIAGNOSIS — R0989 Other specified symptoms and signs involving the circulatory and respiratory systems: Secondary | ICD-10-CM

## 2013-04-18 DIAGNOSIS — R0609 Other forms of dyspnea: Secondary | ICD-10-CM

## 2013-04-18 DIAGNOSIS — I10 Essential (primary) hypertension: Secondary | ICD-10-CM

## 2013-04-18 DIAGNOSIS — R0600 Dyspnea, unspecified: Secondary | ICD-10-CM

## 2013-04-18 DIAGNOSIS — R002 Palpitations: Secondary | ICD-10-CM

## 2013-04-18 LAB — TSH: TSH: 0.63 u[IU]/mL (ref 0.35–5.50)

## 2013-04-18 NOTE — Assessment & Plan Note (Signed)
Blood pressure controlled. Continue present medications. 

## 2013-04-18 NOTE — Patient Instructions (Signed)
Your physician recommends that you schedule a follow-up appointment in: 3 MONTHS WITH DR Jens Som  Your physician has requested that you have an echocardiogram. Echocardiography is a painless test that uses sound waves to create images of your heart. It provides your doctor with information about the size and shape of your heart and how well your heart's chambers and valves are working. This procedure takes approximately one hour. There are no restrictions for this procedure.   Your physician recommends that you HAVE LAB WORK TODAY  Your physician has requested that you have an exercise tolerance test. For further information please visit [email or web address] Please also follow instruction sheet, as given.    Exercise Stress Electrocardiography An exercise stress test is a heart test (EKG) which is done while you are moving. You will walk on a treadmill. This test will tell your doctor how your heart does when it is forced to work harder and how much activity you can safely handle. BEFORE THE TEST  Wear shorts or athletic pants.  Wear comfortable tennis shoes.  Women need to wear a bra that allows patches to be put on under it. TEST  An EKG cable will be attached to your waist. This cable is hooked up to patches, which look like round stickers stuck to your chest.  You will be asked to walk on the treadmill.  You will walk until you are too tired or until you are told to stop.  Tell the doctor right away if you have:  Chest pain.  Leg cramps.  Shortness of breath.  Dizziness.  The test may last 30 minutes to 1 hour. The timing depends on your physical condition and the condition of your heart. AFTER THE TEST  You will rest for about 6 minutes. During this time, your heart rhythm and blood pressure will be checked.  The testing equipment will be removed from your body and you can get dressed.  You may go home or back to your hospital room. You may keep doing all your usual  activities as told by your doctor. Finding out the results of your test Ask when your test results will be ready. Make sure you get your test results. Document Released: 10/14/2007 Document Revised: 07/20/2011 Document Reviewed: 10/14/2007 [facility] Patient Information 2014 [place], [place].

## 2013-04-18 NOTE — Progress Notes (Signed)
     HPI: 57 year-old male for evaluation of palpitations. Chest x-ray in December of 2014 show no active disease. Hemoglobin 15.8. Potassium 3.7. Troponins normal. Patient seen in the emergency room for palpitations on December 7. PVCs noted on telemetry. On December 7 patient was at the cafeteria and developed palpitations described as a flutter. No associated dizziness, syncope, chest pain or dyspnea. His symptoms persisted prompting visit to the emergency room. Otherwise does not have exertional chest pain, history of syncope, orthopnea, PND or pedal edema. Occasional dyspnea on exertion.  Current Outpatient Prescriptions  Medication Sig Dispense Refill  . Ascorbic Acid (VITAMIN C) 1000 MG tablet Take 1,000 mg by mouth daily.      Marland Kitchen aspirin 81 MG tablet Take 81 mg by mouth daily.      Marland Kitchen atenolol (TENORMIN) 50 MG tablet Take 50 mg by mouth daily.      . Multiple Vitamin (MULTIVITAMIN) tablet Take 1 tablet by mouth daily.      . sildenafil (VIAGRA) 25 MG tablet Take 25 mg by mouth as needed for erectile dysfunction. A chip of tab taken when taken      . vitamin E 200 UNIT capsule Take 200 Units by mouth daily.       No current facility-administered medications for this visit.    No Known Allergies  Past Medical History  Diagnosis Date  . Hypercholesterolemia   . Allergic rhinitis   . Hypertension   . Colon polyps     Past Surgical History  Procedure Laterality Date  . Knee arthroscopy  June 2011    right  . Colonoscopy    . Tonsillectomy      History   Social History  . Marital Status: Married    Spouse Name: N/A    Number of Children: 2  . Years of Education: N/A   Occupational History  . Not on file.   Social History Main Topics  . Smoking status: Former Smoker    Types: Cigarettes    Quit date: 05/11/1998  . Smokeless tobacco: Never Used     Comment: smoked only socially x 5 yrs  . Alcohol Use: 12.6 oz/week    21 Glasses of wine per week  . Drug Use: No  .  Sexual Activity: Yes   Other Topics Concern  . Not on file   Social History Narrative   He lives with his wife, he is a Transport planner, travel, desk job, no smoking.    Family History  Problem Relation Age of Onset  . Asthma Father   . Colon cancer Neg Hx   . High blood pressure Mother     ROS: no fevers or chills, productive cough, hemoptysis, dysphasia, odynophagia, melena, hematochezia, dysuria, hematuria, rash, seizure activity, orthopnea, PND, pedal edema, claudication. Remaining systems are negative.  Physical Exam:   Blood pressure 132/80, pulse 76, height 5\' 10"  (1.778 m), weight 180 lb (81.647 kg).  General:  Well developed/well nourished in NAD Skin warm/dry Patient not depressed No peripheral clubbing Back-normal HEENT-normal/normal eyelids Neck supple/normal carotid upstroke bilaterally; no bruits; no JVD; no thyromegaly chest - CTA/ normal expansion CV - RRR/normal S1 and S2; no murmurs, rubs or gallops;  PMI nondisplaced Abdomen -NT/ND, no HSM, no mass, + bowel sounds, no bruit 2+ femoral pulses, no bruits Ext-no edema, chords, 2+ DP Neuro-grossly nonfocal  ECG 04/16/2013-sinus rhythm with no ST changes.

## 2013-04-18 NOTE — Assessment & Plan Note (Signed)
Patient had palpitations with documented PVCs on telemetry. Plan to check echocardiogram for LV function. Schedule exercise treadmill. He consumes 4 glasses of wine per night and I have asked him to decrease. I've asked him to avoid caffeine. Continue atenolol. If his symptoms do not improve and LV function is normal we will advance his beta blocker in the future.

## 2013-04-19 ENCOUNTER — Encounter: Payer: Self-pay | Admitting: Cardiology

## 2013-04-19 ENCOUNTER — Telehealth: Payer: Self-pay | Admitting: Cardiology

## 2013-04-19 NOTE — Telephone Encounter (Signed)
Pt called back/ I reviewed TSH and forwarded to his PCP. Pt verbalized understanding.

## 2013-04-19 NOTE — Telephone Encounter (Signed)
Left pt a message to call back. 

## 2013-04-19 NOTE — Telephone Encounter (Signed)
Ne message    Received TSH results off of mychart but have a question about the results

## 2013-04-20 ENCOUNTER — Encounter: Payer: Self-pay | Admitting: Internal Medicine

## 2013-04-20 NOTE — Assessment & Plan Note (Signed)
Adequate control on present rx, reviewed > no change in rx needed   

## 2013-04-20 NOTE — Assessment & Plan Note (Signed)
prob benign pvc's/ highly stressed pt with resting hr of 50 on atenolol so no room to increase > referred to cardiology

## 2013-04-21 ENCOUNTER — Ambulatory Visit (HOSPITAL_COMMUNITY)
Admission: RE | Admit: 2013-04-21 | Discharge: 2013-04-21 | Disposition: A | Discharge status: Home / Self Care | Payer: BC Managed Care – PPO | Source: Ambulatory Visit | Attending: Cardiovascular Disease | Admitting: Cardiovascular Disease

## 2013-04-21 ENCOUNTER — Ambulatory Visit (HOSPITAL_BASED_OUTPATIENT_CLINIC_OR_DEPARTMENT_OTHER): Payer: BC Managed Care – PPO | Admitting: Radiology

## 2013-04-21 DIAGNOSIS — R0602 Shortness of breath: Secondary | ICD-10-CM | POA: Insufficient documentation

## 2013-04-21 DIAGNOSIS — R0600 Dyspnea, unspecified: Secondary | ICD-10-CM

## 2013-04-21 DIAGNOSIS — R002 Palpitations: Secondary | ICD-10-CM

## 2013-04-21 DIAGNOSIS — Z87891 Personal history of nicotine dependence: Secondary | ICD-10-CM | POA: Insufficient documentation

## 2013-04-21 DIAGNOSIS — E7800 Pure hypercholesterolemia, unspecified: Secondary | ICD-10-CM | POA: Insufficient documentation

## 2013-04-21 DIAGNOSIS — R0609 Other forms of dyspnea: Secondary | ICD-10-CM

## 2013-04-21 DIAGNOSIS — I10 Essential (primary) hypertension: Secondary | ICD-10-CM | POA: Insufficient documentation

## 2013-04-21 DIAGNOSIS — R0989 Other specified symptoms and signs involving the circulatory and respiratory systems: Secondary | ICD-10-CM

## 2013-04-21 NOTE — Progress Notes (Signed)
Echocardiogram performed.  

## 2013-04-25 ENCOUNTER — Other Ambulatory Visit (INDEPENDENT_AMBULATORY_CARE_PROVIDER_SITE_OTHER): Payer: BC Managed Care – PPO

## 2013-04-25 ENCOUNTER — Encounter: Payer: Self-pay | Admitting: Internal Medicine

## 2013-04-25 ENCOUNTER — Ambulatory Visit (INDEPENDENT_AMBULATORY_CARE_PROVIDER_SITE_OTHER): Payer: BC Managed Care – PPO | Admitting: Internal Medicine

## 2013-04-25 VITALS — BP 110/72 | HR 50 | Temp 98.0°F | Ht 71.0 in | Wt 180.0 lb

## 2013-04-25 DIAGNOSIS — R002 Palpitations: Secondary | ICD-10-CM

## 2013-04-25 DIAGNOSIS — Z0000 Encounter for general adult medical examination without abnormal findings: Secondary | ICD-10-CM

## 2013-04-25 DIAGNOSIS — I10 Essential (primary) hypertension: Secondary | ICD-10-CM

## 2013-04-25 DIAGNOSIS — E7800 Pure hypercholesterolemia, unspecified: Secondary | ICD-10-CM

## 2013-04-25 DIAGNOSIS — Z0001 Encounter for general adult medical examination with abnormal findings: Secondary | ICD-10-CM | POA: Insufficient documentation

## 2013-04-25 DIAGNOSIS — J309 Allergic rhinitis, unspecified: Secondary | ICD-10-CM

## 2013-04-25 LAB — URINALYSIS
Bilirubin Urine: NEGATIVE
Ketones, ur: NEGATIVE
Leukocytes, UA: NEGATIVE
Nitrite: NEGATIVE
Specific Gravity, Urine: 1.015 (ref 1.000–1.030)
Total Protein, Urine: NEGATIVE
Urobilinogen, UA: 0.2 (ref 0.0–1.0)
pH: 8 (ref 5.0–8.0)

## 2013-04-25 LAB — LIPID PANEL
HDL: 51.8 mg/dL (ref >39.00)
Total CHOL/HDL Ratio: 4

## 2013-04-25 LAB — BASIC METABOLIC PANEL
BUN: 16 mg/dL (ref 6–23)
CO2: 30 mEq/L (ref 19–32)
Calcium: 9.1 mg/dL (ref 8.4–10.5)
Chloride: 103 mEq/L (ref 96–112)
Glucose, Bld: 97 mg/dL (ref 70–99)
Sodium: 138 mEq/L (ref 135–145)

## 2013-04-25 LAB — HEPATIC FUNCTION PANEL
ALT: 17 U/L (ref 0–53)
Albumin: 4 g/dL (ref 3.5–5.2)
Alkaline Phosphatase: 54 U/L (ref 39–117)
Bilirubin, Direct: 0.1 mg/dL (ref 0.0–0.3)
Total Bilirubin: 1 mg/dL (ref 0.3–1.2)
Total Protein: 7 g/dL (ref 6.0–8.3)

## 2013-04-25 NOTE — Patient Instructions (Addendum)
Please remember to go to the lab  department downstairs for your tests - we will call you with the results when they are available.  Try reading for 30 min with just a reading light on every night and minimizing your wine intake to help you sleep better  Please schedule a follow up visit in 3 months but call sooner if needed

## 2013-04-25 NOTE — Progress Notes (Signed)
Subjective:    Patient ID: Richard Roach, male    DOB: [birth year]    MRN: [id number]  HPI   77 yowm with minimum smoking history and documented chronic rhinitis with allergies to dust and mold cats with a recurrent pattern of nonspecific rhinitis triggered by viral upper respiratory infections or airplane travel not compliant with nasal steroids.       04/18/2012  CPX  With multiple chronic concerns 1) tingling mid back, did not discuss this with neurology on recent eval 2) timing for colonoscopy for polyps > in computer for recall 3) urinary hesistancy, did not discuss this with  urology 4) freq uri/ rhinitis/sinusitis ,  Not maintaining on nasonex, did not remember instructions re appropriate use of afrin x 5 days only rec Stay on nasal steroids, avoid antihistamines, f/u with urology prn  09/24/2012  Acute ov/Richard Roach re new symptoms Chief Complaint  Patient presents with  . Acute Visit    Pt c/o "crackling sound" in left ear x 2 wks.    did not use nasal steroids or afrin, feels ears pop and crackle when yawns but no problem flying commercially as recently as one day prior to OV  - no ear pain, loss of hearing or obvious nasal congestion >>debrox   11/04/2012 Acute OV  Complains of bilateral ear wash. Ears stopped . otc ear wax products not working Decreased hearing.  No pain or drainage Using qtips at home flys a lot.  rec Claritin daily As needed  Drainage  Debrox As needed  For ear wax As needed      04/17/2013 f/u ov/Richard Roach re: new palpitations/ pvcs documented in ER  Chief Complaint  Patient presents with  . Follow-up    Pt states went to [facility] ED 04/16/13 for "heart fluttering"- was not admitted.  He states feeling tired today, relates to stress.   not ex regulalry, on atenolol 50 mg and asa daily, no decongestants/ one - 2 coffee and no caff bev thereafter.   Occurred while at lunch > ER with nl trop/ pvc's on monitor but nl ekg> neg w/u by [facility] rec reduce wine intake     04/25/2013 f/u ov/Richard Roach re: cpx  Chief Complaint  Patient presents with  . Annual Exam    Pt fasting. He c/o not sleeping well recently- relates to stress at work.     Poor sleep hygiene noted. Not clear he's reduced wine as rec by cards but no more palpitations  No obvious day to day or daytime variabilty or assoc chronic cough or cp or chest tightness, subjective wheeze overt sinus or hb symptoms. No unusual exp hx or h/o childhood pna/ asthma or knowledge of premature birth.  Sleeping poorly without nocturnal  or early am exacerbation  of respiratory  c/o's or need for noct saba. Also denies any obvious fluctuation of symptoms with weather or environmental changes or other aggravating or alleviating factors except as outlined above   Current Medications, Allergies, Complete Past Medical History, Past Surgical History, Family History, and Social History were reviewed in [employer] record.  ROS  The following are not active complaints unless bolded sore throat, dysphagia, dental problems, itching, sneezing,  nasal congestion or excess/ purulent secretions, ear ache,   fever, chills, sweats, unintended wt loss, pleuritic or exertional cp, hemoptysis,  orthopnea pnd or leg swelling, presyncope, palpitations, heartburn, abdominal pain, anorexia, nausea, vomiting, diarrhea  or change in bowel or urinary habits, change in stools or urine, dysuria,hematuria,  rash, arthralgias, visual complaints, headache, numbness weakness or ataxia or problems with walking or coordination,  change in mood/affect or memory.               .  Past Medical History:  HYPERCHOLESTEROLEMIA (ICD-272.0)  - Target LDL < 130 (Type A plus male gender)  ALLERGIC RHINITIS, CHRONIC (ICD-477.9)  - Chronic rhinitis flyers reviewed 05/27/99, 05/16/02, 12/23/04 and 03/11/06  - Pt denied ever receiving a chronic rhinitis flyer July 15, 2009, not using it March 26, 2010  - Chronic rhinitis flyers  reviewed 05/27/99, 05/16/02, 12/23/04 and 03/11/06  - Pt denied ever receiving a chronic rhinitis flyer July 15, 2009, not using it March 26, 2010  BPH ....................................................................... [name] (ICD-401.9)  Colon Polyps  - See Colonoscopy 09/19/2007 .................................  [facility]  Health Maintenance......................................................Marland KitchenWert  - Td 09/2007  - Pneumovax 05/2003 (second shot)  - CPX 04/25/2013     Family History:  Asthma/emphysema in his father died in [place] at 8  negative for cancer  mother healthy x hbp, chol older  1/2 brother by 11 years and healthy x hbp   Social History:  Former smoker. Quit in 2000,  Only ever socially.  married  2 children  Sales, freq flyer  ETOH- daily, wine              Objective:   Physical Exam  robust   ambulatory white male in no acute distress jumping from one concern to the other  wt 190 April 30, 2009 > 190 July 15, 2009 >  > 183 May 02, 2010 > 186 05/07/2011 >09/07/2011 183 >>182 09/18/2011  > 04/18/2012 183 > 178 09/23/12>178 11/04/2012 > 04/25/2013 180  HEENT: nl dentition,  Neck without JVD/Nodes/TM nl carotids without bruit  Lungs clear to A and P bilaterally without cough on insp or exp maneuvers  RRR no s3 or murmur or increase in P2 no displacement pmi, no extra beats  Abd soft and benign with nl excursion in the supine position. No bruits or organomegaly  Ext warm without calf tenderness, cyanosis clubbing or edema, nl pulses  Neuro alert, anxious no deficits  MS nl gait, no restrictions  Skin no lesions  GU testes down bilat, neg IH Rectal mild bph, soft, smooth texture, stool g neg       ekg 04/17/13 wnl     Assessment & Plan:

## 2013-04-26 ENCOUNTER — Telehealth: Payer: Self-pay | Admitting: Cardiology

## 2013-04-26 NOTE — Assessment & Plan Note (Signed)
Followed as Primary Care Patient/ [facility]/ Richard Roach > referred to cardiology 04/17/2013   Reviewed findings/ recs by cards, no change med rx

## 2013-04-26 NOTE — Assessment & Plan Note (Signed)
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 Chronic right maxillary sinusitis changes with slight mucosal thickening which also involves the maxillary sinus ostium and infundibulum   - Consistently inconsistent with ICS documented 12/05/10  And 04/18/12   Again not using nasal steroids as maint rx, rec he at least use NS irrigation prn

## 2013-04-26 NOTE — Assessment & Plan Note (Signed)
-   Target LDL < 130 due to hbp/ typeA personality  Lab Results  Component Value Date   CHOL 197 04/25/2013   HDL 51.80 04/25/2013   LDLCALC 134* 04/25/2013   LDLDIRECT 164.0 04/18/2012   TRIG 58.0 04/25/2013   CHOLHDL 4 04/25/2013     Adequate control on present rx, reviewed > no change in rx needed  But needs more consistent aerobic ex

## 2013-04-26 NOTE — Telephone Encounter (Signed)
Spoke with pt, aware of normal treadmill results

## 2013-04-26 NOTE — Progress Notes (Signed)
Quick Note:  Spoke with pt and notified of results per Dr. Wert. Pt verbalized understanding and denied any questions.  ______ 

## 2013-04-26 NOTE — Telephone Encounter (Signed)
New problem    Pt needs a call back for the stress test results done on 12/12 ECHO,

## 2013-04-26 NOTE — Assessment & Plan Note (Signed)
Adequate control on present rx, reviewed > no change in rx needed  Though may be over beta blocked at baseline, needs beta blocker for tendency to stress related hbp/ palpitations

## 2013-05-01 ENCOUNTER — Ambulatory Visit (HOSPITAL_COMMUNITY): Payer: BC Managed Care – PPO

## 2013-06-15 ENCOUNTER — Ambulatory Visit: Payer: BC Managed Care – PPO | Admitting: Family Medicine

## 2013-07-17 ENCOUNTER — Encounter: Payer: Self-pay | Admitting: Cardiology

## 2013-07-17 ENCOUNTER — Ambulatory Visit (INDEPENDENT_AMBULATORY_CARE_PROVIDER_SITE_OTHER): Payer: BC Managed Care – PPO | Admitting: Cardiology

## 2013-07-17 VITALS — BP 142/82 | HR 45 | Ht 70.0 in | Wt 182.0 lb

## 2013-07-17 DIAGNOSIS — R002 Palpitations: Secondary | ICD-10-CM

## 2013-07-17 DIAGNOSIS — I10 Essential (primary) hypertension: Secondary | ICD-10-CM

## 2013-07-17 NOTE — Patient Instructions (Signed)
Your physician wants you to follow-up in: 6 MONTHS WITH DR CRENSHAW You will receive a reminder letter in the mail two months in advance. If you don't receive a letter, please call our office to schedule the follow-up appointment.  

## 2013-07-17 NOTE — Assessment & Plan Note (Signed)
These appear to be secondary to PVCs. TSH and potassium normal. Exercise treadmill negative. LV function normal. Continue atenolol. I cannot advance the dose given bradycardia. I have again asked him to decrease alcohol use to see if this is contributing. We could consider an antiarrhythmic in the future if symptoms worsen and are not tolerable. I would like to avoid this if possible.

## 2013-07-17 NOTE — Progress Notes (Signed)
      HPI: FU palpitations. Chest x-ray in December of 2014 show no active disease. Hemoglobin 15.8. Potassium 3.7. Troponins normal. Patient seen in the emergency room for palpitations on December 7. PVCs noted on telemetry. Echocardiogram December 2014 showed normal LV function, grade 2 diastolic dysfunction and mild left atrial enlargement. Exercise treadmill in December of 2014 normal. TSH in December 2014 normal. Since I last saw him, there is no dyspnea, chest pain or syncope. He continues to have occasional palpitations described as a brief flutter. Nonsustained.   Current Outpatient Prescriptions  Medication Sig Dispense Refill  . Ascorbic Acid (VITAMIN C) 1000 MG tablet Take 1,000 mg by mouth daily.      Marland Kitchen aspirin 81 MG tablet Take 81 mg by mouth daily.      Marland Kitchen atenolol (TENORMIN) 50 MG tablet Take 50 mg by mouth daily.      . Multiple Vitamin (MULTIVITAMIN) tablet Take 1 tablet by mouth daily.      . sildenafil (VIAGRA) 25 MG tablet Take 25 mg by mouth as needed for erectile dysfunction. A chip of tab taken when taken      . vitamin E 200 UNIT capsule Take 200 Units by mouth daily.       No current facility-administered medications for this visit.     Past Medical History  Diagnosis Date  . Hypercholesterolemia   . Allergic rhinitis   . Hypertension   . Colon polyps     Past Surgical History  Procedure Laterality Date  . Knee arthroscopy  June 2011    right  . Colonoscopy    . Tonsillectomy      History   Social History  . Marital Status: Married    Spouse Name: N/A    Number of Children: 2  . Years of Education: N/A   Occupational History  . Not on file.   Social History Main Topics  . Smoking status: Former Smoker    Types: Cigarettes    Quit date: 05/11/1998  . Smokeless tobacco: Never Used     Comment: smoked only socially x 5 yrs  . Alcohol Use: 12.6 oz/week    21 Glasses of wine per week  . Drug Use: No  . Sexual Activity: Yes   Other Topics  Concern  . Not on file   Social History Narrative   He lives with his wife, he is a Tree surgeon, travel, desk job, no smoking.    ROS: no fevers or chills, productive cough, hemoptysis, dysphasia, odynophagia, melena, hematochezia, dysuria, hematuria, rash, seizure activity, orthopnea, PND, pedal edema, claudication. Remaining systems are negative.  Physical Exam: Well-developed well-nourished in no acute distress.  Skin is warm and dry.  HEENT is normal.  Neck is supple.  Chest is clear to auscultation with normal expansion.  Cardiovascular exam is regular rate and rhythm.  Abdominal exam nontender or distended. No masses palpated. Extremities show no edema. neuro grossly intact  ECG marked sinus bradycardia at a rate of 45. No ST changes.

## 2013-07-17 NOTE — Assessment & Plan Note (Signed)
Continue present blood pressure medications. 

## 2013-07-22 ENCOUNTER — Other Ambulatory Visit: Payer: Self-pay | Admitting: Internal Medicine

## 2013-07-24 ENCOUNTER — Telehealth: Payer: Self-pay | Admitting: Cardiology

## 2013-07-24 DIAGNOSIS — R002 Palpitations: Secondary | ICD-10-CM

## 2013-07-24 NOTE — Telephone Encounter (Signed)
New message     Patient traveling today.   Patient calling would like to be set up for holter monitor.  Previous discuss with Dr. Stanford Breed during last office visit.

## 2013-07-24 NOTE — Telephone Encounter (Signed)
Spoke with pt, aware order placed and sent to [facility] for scheduling.

## 2013-07-27 ENCOUNTER — Encounter: Payer: Self-pay | Admitting: Radiology

## 2013-07-27 ENCOUNTER — Encounter (INDEPENDENT_AMBULATORY_CARE_PROVIDER_SITE_OTHER): Payer: BC Managed Care – PPO

## 2013-07-27 DIAGNOSIS — R002 Palpitations: Secondary | ICD-10-CM

## 2013-07-27 NOTE — Progress Notes (Signed)
Patient ID: Richard Roach, male   DOB: 1956-03-29, 58 y.o.   MRN: [id number] [employer] 24hr holter applied

## 2013-07-31 ENCOUNTER — Other Ambulatory Visit: Payer: Self-pay

## 2013-08-04 ENCOUNTER — Telehealth: Payer: Self-pay | Admitting: Cardiology

## 2013-08-04 NOTE — Telephone Encounter (Signed)
New message   Patient calling for monitor results 

## 2013-08-04 NOTE — Telephone Encounter (Signed)
Spoke with pt, aware monitor is here for dr Stanford Breed to review. I looked through the monitor and told the pt he has a few PVC's but everything else looks normal. Will call him back once reviewed by dr Stanford Breed.

## 2013-08-08 ENCOUNTER — Telehealth: Payer: Self-pay | Admitting: *Deleted

## 2013-08-08 NOTE — Telephone Encounter (Signed)
Spoke with pt, monitor reviewed by dr Stanford Breed shows sinus with occ pvc's

## 2013-08-12 ENCOUNTER — Telehealth: Payer: Self-pay | Admitting: Internal Medicine

## 2013-08-12 MED ORDER — AZITHROMYCIN 250 MG PO TABS
ORAL_TABLET | ORAL | Status: DC
Start: 2013-08-12 — End: 2013-08-18

## 2013-08-12 NOTE — Telephone Encounter (Signed)
2 days after got off airplane nasal congestion no sob/ fever/ no better with advil cold and sinus and leaving 4/6 am for another flight  Not using afrin / flonase yet > rec he do so  zpak x one  If not better may need to delay air travel

## 2013-08-14 ENCOUNTER — Telehealth: Payer: Self-pay | Admitting: Internal Medicine

## 2013-08-14 NOTE — Telephone Encounter (Signed)
Called spoke with patient who reported that he was given zpak on 4.4.15 for head and chest congestion/sinus pressure/achy legs.  His symptoms do seem as though they have intensified over the weekend.  Pt has been taking 2 tabs of NyQuil and taking Afrin nasal spray.  He is having some achiness on the very top of his head that resembles a headache.  Pt states "it really hurts."  Pt stated he called to see if he could take Ibuprofen in addition to the other meds he has been taking.    Advised pt that he may take Ibuprofen/Tylenol for the headache and to try an expectorant and decongestant like Mucinex DM instead of the NyQuil.  Pt okay with these recommendations verbalized his understanding.  Pt to call the office back if his symptoms do not improve or worsen.  Will sign and forward to MW as Juluis Rainier.

## 2013-08-14 NOTE — Telephone Encounter (Signed)
Pt called back. States he has really bad headache on top right side of head. He has taken 2 Ny-quil and z-pack. Wants to know what to take for the headache.

## 2013-08-16 ENCOUNTER — Telehealth: Payer: Self-pay | Admitting: Internal Medicine

## 2013-08-16 NOTE — Telephone Encounter (Signed)
Spoke with the pt  He states finished zithromax that was called in on 08/12/13  His symptoms are no better He c/o aches all over, congestion and prod cough with clear to yellow sputum  OV with TP tomorrow at 9:45

## 2013-08-17 ENCOUNTER — Ambulatory Visit: Payer: BC Managed Care – PPO | Admitting: Adult Health

## 2013-08-18 ENCOUNTER — Encounter: Payer: Self-pay | Admitting: Adult Health

## 2013-08-18 ENCOUNTER — Telehealth: Payer: Self-pay | Admitting: Internal Medicine

## 2013-08-18 ENCOUNTER — Ambulatory Visit: Payer: BC Managed Care – PPO | Admitting: Critical Care Medicine

## 2013-08-18 ENCOUNTER — Ambulatory Visit (INDEPENDENT_AMBULATORY_CARE_PROVIDER_SITE_OTHER): Payer: BC Managed Care – PPO | Admitting: Internal Medicine

## 2013-08-18 ENCOUNTER — Ambulatory Visit (INDEPENDENT_AMBULATORY_CARE_PROVIDER_SITE_OTHER): Payer: BC Managed Care – PPO | Admitting: Adult Health

## 2013-08-18 ENCOUNTER — Ambulatory Visit (INDEPENDENT_AMBULATORY_CARE_PROVIDER_SITE_OTHER)
Admission: RE | Admit: 2013-08-18 | Discharge: 2013-08-18 | Disposition: A | Discharge status: Home / Self Care | Payer: BC Managed Care – PPO | Source: Ambulatory Visit | Attending: Adult Health | Admitting: Adult Health

## 2013-08-18 VITALS — BP 128/84 | HR 59 | Temp 97.4°F | Ht 70.0 in | Wt 186.2 lb

## 2013-08-18 DIAGNOSIS — M7022 Olecranon bursitis, left elbow: Secondary | ICD-10-CM

## 2013-08-18 DIAGNOSIS — J069 Acute upper respiratory infection, unspecified: Secondary | ICD-10-CM

## 2013-08-18 DIAGNOSIS — M7020 Olecranon bursitis, unspecified elbow: Secondary | ICD-10-CM

## 2013-08-18 MED ORDER — PREDNISONE 10 MG PO TABS
ORAL_TABLET | ORAL | Status: DC
Start: 2013-08-18 — End: 2014-01-19

## 2013-08-18 MED ORDER — AMOXICILLIN-POT CLAVULANATE 875-125 MG PO TABS: 1.0000 | ORAL_TABLET | Freq: Two times a day (BID) | ORAL | Status: AC

## 2013-08-18 MED ORDER — LEVALBUTEROL HCL 0.63 MG/3ML IN NEBU
0.6300 mg | INHALATION_SOLUTION | Freq: Once | RESPIRATORY_TRACT | Status: AC
  Administered 2013-08-18: 0.63 mg via RESPIRATORY_TRACT

## 2013-08-18 NOTE — Patient Instructions (Signed)
Augmentin 875mg  Twice daily  For 7 days -take w/ food.  Prednisone taper over next week Mucinex DM Twice daily  As needed  Cough  Fluids and rest .  Cool compresses to elbow Avoid propping up elbow against hard surfaces.  Please contact office for sooner follow up if symptoms do not improve or worsen or seek emergency care

## 2013-08-18 NOTE — Telephone Encounter (Signed)
Spoke with pt. He is scheduled to come in and see MW this AM. Nothing further needed

## 2013-08-23 DIAGNOSIS — M7022 Olecranon bursitis, left elbow: Secondary | ICD-10-CM | POA: Insufficient documentation

## 2013-08-23 NOTE — Assessment & Plan Note (Signed)
Prednisone taper over next week Cool compresses to elbow Avoid propping up elbow against hard surfaces.  Advised if not improving will need further eval.  Please contact office for sooner follow up if symptoms do not improve or worsen or seek emergency care

## 2013-08-23 NOTE — Progress Notes (Signed)
Subjective:    Patient ID: Richard Roach, male    DOB: 1955-06-30    MRN: [id number]  HPI   63 yowm with minimum smoking history and documented chronic rhinitis with allergies to dust and mold cats with a recurrent pattern of nonspecific rhinitis triggered by viral upper respiratory infections or airplane travel not compliant with nasal steroids.       04/18/2012  CPX  With multiple chronic concerns 1) tingling mid back, did not discuss this with neurology on recent eval 2) timing for colonoscopy for polyps > in computer for recall 3) urinary hesistancy, did not discuss this with  urology 4) freq uri/ rhinitis/sinusitis ,  Not maintaining on nasonex, did not remember instructions re appropriate use of afrin x 5 days only rec Stay on nasal steroids, avoid antihistamines, f/u with urology prn  09/24/2012  Acute ov/Wert re new symptoms Chief Complaint  Patient presents with  . Acute Visit    Pt c/o "crackling sound" in left ear x 2 wks.    did not use nasal steroids or afrin, feels ears pop and crackle when yawns but no problem flying commercially as recently as one day prior to OV  - no ear pain, loss of hearing or obvious nasal congestion >>debrox   11/04/2012 Acute OV  Complains of bilateral ear wash. Ears stopped . otc ear wax products not working Decreased hearing.  No pain or drainage Using qtips at home flys a lot.  rec Claritin daily As needed  Drainage  Debrox As needed  For ear wax As needed      04/17/2013 f/u ov/Wert re: new palpitations/ pvcs documented in ER  Chief Complaint  Patient presents with  . Follow-up    Pt states went to [facility] ED 04/16/13 for "heart fluttering"- was not admitted.  He states feeling tired today, relates to stress.   not ex regulalry, on atenolol 50 mg and asa daily, no decongestants/ one - 2 coffee and no caff bev thereafter.   Occurred while at lunch > ER with nl trop/ pvc's on monitor but nl ekg> neg w/u by [place] rec reduce wine intake     04/25/2013 f/u ov/Wert re: cpx  Chief Complaint  Patient presents with  . Annual Exam    Pt fasting. He c/o not sleeping well recently- relates to stress at work.     Poor sleep hygiene noted. Not clear he's reduced wine as rec by cards but no more palpitations >>  4/101/5 Acute OV  Complains of 1 week of cough , congestion w/ thick yellow mucus.  He was called in zpak, and has finished this without much improvement .  Leaving on vacation next week for [place] does not want to be sick.   Pt has edema and errythema on left elbow x 3 days. No known injury , no change in ROM .  Tender to touch. No fever. Does prop arm up a lot for computer work and driving.  No neck pain, n,/v/d , chest pain .      Current Medications, Allergies, Complete Past Medical History, Past Surgical History, Family History, and Social History were reviewed in [employer] record.  ROS  The following are not active complaints unless bolded sore throat, dysphagia, dental problems, itching, sneezing, , ear ache,   fever, chills, sweats, unintended wt loss, pleuritic or exertional cp, hemoptysis,  orthopnea pnd or leg swelling, presyncope,  heartburn, abdominal pain, anorexia, nausea, vomiting, diarrhea  or change in bowel  or urinary habits, change in stools or urine, dysuria,hematuria,  rash, arthralgias, visual complaints, headache, numbness weakness or ataxia or problems with walking or coordination,  change in mood/affect or memory.               .  Past Medical History:  HYPERCHOLESTEROLEMIA (ICD-272.0)  - Target LDL < 130 (Type A plus male gender)  ALLERGIC RHINITIS, CHRONIC (ICD-477.9)  - Chronic rhinitis flyers reviewed 05/27/99, 05/16/02, 12/23/04 and 03/11/06  - Pt denied ever receiving a chronic rhinitis flyer July 15, 2009, not using it March 26, 2010  - Chronic rhinitis flyers reviewed 05/27/99, 05/16/02, 12/23/04 and 03/11/06  - Pt denied ever receiving a chronic rhinitis flyer  July 15, 2009, not using it March 26, 2010  BPH ....................................................................... [name] (ICD-401.9)  Colon Polyps  - See Colonoscopy 09/19/2007 .................................  [facility]  Health Maintenance......................................................Marland KitchenWert  - Td 09/2007  - Pneumovax 05/2003 (second shot)  - CPX 04/25/2013     Family History:  Asthma/emphysema in his father died in [place] at 73  negative for cancer  mother healthy x hbp, chol older  1/2 brother by 11 years and healthy x hbp   Social History:  Former smoker. Quit in 2000,  Only ever socially.  married  2 children  Sales, freq flyer  ETOH- daily, wine              Objective:   Physical Exam  robust   ambulatory white male in no acute distress jumping from one concern to the other  wt 190 April 30, 2009 > 190 July 15, 2009 >  > 183 May 02, 2010 > 186 05/07/2011 >09/07/2011 183 >>182 09/18/2011  > 04/18/2012 183 > 178 09/23/12>178 11/04/2012 > 04/25/2013 180 >186 4/10 HEENT: nl dentition,  Neck without JVD/Nodes/TM nl carotids without bruit  Lungs trace rhonchi  RRR no s3 or murmur or increase in P2 no displacement pmi, no extra beats  Abd soft and benign with nl excursion in the supine position. No bruits or organomegaly  Ext warm without calf tenderness, cyanosis clubbing or edema, nl pulses  Neuro alert, anxious no deficits  MS nl gait, no restrictions  Skin along left elbow warm to touch with mild edema and redness   CXR 08/18/13 >NAD     Assessment & Plan:

## 2013-08-23 NOTE — Assessment & Plan Note (Signed)
Slow to resolve URI/Bronchitis  cxr w/ no acute issues   Plan  Augmentin 875mg  Twice daily  For 7 days -take w/ food.  Prednisone taper over next week Mucinex DM Twice daily  As needed  Cough  Fluids and rest .    Please contact office for sooner follow up if symptoms do not improve or worsen or seek emergency care

## 2013-09-11 ENCOUNTER — Telehealth: Payer: Self-pay | Admitting: Gastroenterology

## 2013-09-11 NOTE — Telephone Encounter (Signed)
Patient has had intermittent diarrhea.  He recently has had several different antibiotics for a sinus infection.  He also wanted to discuss getting a stool test for polyps.  I did recommend the patient schedule an appt for diarrhea he wants to try probiotics and will call back if that does not help..  I also explained that he had a colonoscopy less than 1 year ago and is not due to for a repeat until 5 years.  I explained that we do not recommend routine stool testing for blood between a colonoscopy.  He is advised to call before 5 years for blood in stool, changes in stool caliber or bowel habits.

## 2013-10-20 ENCOUNTER — Telehealth: Payer: Self-pay | Admitting: Gastroenterology

## 2013-10-20 NOTE — Telephone Encounter (Signed)
i have advised the patient that according the ASG guidelines he does not need hemoccult testing prior to colon in 2019

## 2013-11-24 ENCOUNTER — Other Ambulatory Visit: Payer: Self-pay | Admitting: Internal Medicine

## 2014-01-19 ENCOUNTER — Encounter: Payer: Self-pay | Admitting: Internal Medicine

## 2014-01-19 ENCOUNTER — Ambulatory Visit (INDEPENDENT_AMBULATORY_CARE_PROVIDER_SITE_OTHER): Payer: BC Managed Care – PPO | Admitting: Internal Medicine

## 2014-01-19 VITALS — BP 122/70 | HR 60 | Temp 98.0°F | Ht 70.0 in | Wt 185.0 lb

## 2014-01-19 DIAGNOSIS — Z23 Encounter for immunization: Secondary | ICD-10-CM

## 2014-01-19 DIAGNOSIS — J069 Acute upper respiratory infection, unspecified: Secondary | ICD-10-CM

## 2014-01-19 DIAGNOSIS — K4090 Unilateral inguinal hernia, without obstruction or gangrene, not specified as recurrent: Secondary | ICD-10-CM

## 2014-01-19 MED ORDER — AZITHROMYCIN 250 MG PO TABS
ORAL_TABLET | ORAL | Status: DC
Start: 2014-01-19 — End: 2014-03-14

## 2014-01-19 NOTE — Progress Notes (Signed)
Subjective:    Patient ID: Richard Roach, male    DOB: 06/01/1955    MRN: [id number]     Brief patient profile:  28 yowm with minimum smoking history and documented chronic rhinitis with allergies to dust and mold cats with a recurrent pattern of nonspecific rhinitis triggered by viral upper respiratory infections or airplane travel not compliant with nasal steroids and recurrent R inguinal pain first develped summer of 2012 with neg w/u for significant IH by Dr Richard Roach      Brief patient profile:  04/18/2012  CPX  With multiple chronic concerns 1) tingling mid back, did not discuss this with neurology on recent eval 2) timing for colonoscopy for polyps > in computer for recall 3) urinary hesistancy, did not discuss this with  urology 4) freq uri/ rhinitis/sinusitis ,  Not maintaining on nasonex, did not remember instructions re appropriate use of afrin x 5 days only rec Stay on nasal steroids, avoid antihistamines, f/u with urology prn    4/101/5 Acute OV  Complains of 1 week of cough , congestion w/ thick yellow mucus.  He was called in zpak, and has finished this without much improvement .  Leaving on vacation next week for [place] does not want to be sick.  Pt has edema and errythema on left elbow x 3 days. No known injury , no change in ROM .  Tender to touch. No fever. Does prop arm up a lot for computer work and driving.  rec Augmentin 875mg  Twice daily  For 7 days -take w/ food.  Prednisone taper over next week Mucinex DM Twice daily  As needed  Cough      01/19/2014 acute ov/Richard Roach re: recurrent R IH pain Chief Complaint  Patient presents with  . Acute Visit    Pt c/o right low abd pain and groin pain x 1 wk- notices p doing some yard work.  He also c/o "cold"- minimal sore throat and "stopped up head"- x 2 days.    No pain with cough, mainly only if pushs on inguinal area. No gi/ gu symptoms at all.  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   No obvious day to day or daytime variabilty or assoc   cp or chest tightness, subjective wheeze overt  hb symptoms. No unusual exp hx or h/o childhood pna/ asthma or knowledge of premature birth.  Sleeping ok without nocturnal  or early am exacerbation  of respiratory  c/o's or need for noct saba. Also denies any obvious fluctuation of symptoms with weather or environmental changes or other aggravating or alleviating factors except as outlined above   Current Medications, Allergies, Complete Past Medical History, Past Surgical History, Family History, and Social History were reviewed in [employer] record.  ROS  The following are not active complaints unless bolded sore throat, dysphagia, dental problems, itching, sneezing,  nasal congestion or excess/ purulent secretions, ear ache,   fever, chills, sweats, unintended wt loss, pleuritic or exertional cp, hemoptysis,  orthopnea pnd or leg swelling, presyncope, palpitations, heartburn, abdominal pain, anorexia, nausea, vomiting, diarrhea  or change in bowel or urinary habits, change in stools or urine, dysuria,hematuria,  rash, arthralgias, visual complaints, headache, numbness weakness or ataxia or problems with walking or coordination,  change in mood/affect or memory.                    .  Past Medical History:  HYPERCHOLESTEROLEMIA (ICD-272.0)  -  Target LDL < 130 (Type A plus male gender)  ALLERGIC RHINITIS, CHRONIC (ICD-477.9)  - Chronic rhinitis flyers reviewed 05/27/99, 05/16/02, 12/23/04 and 03/11/06  - Pt denied ever receiving a chronic rhinitis flyer July 15, 2009, not using it March 26, 2010  - Chronic rhinitis flyers reviewed 05/27/99, 05/16/02, 12/23/04 and 03/11/06  - Pt denied ever receiving a chronic rhinitis flyer July 15, 2009, not using it March 26, 2010  BPH ....................................................................... [name] (ICD-401.9)  Colon Polyps  - See  Colonoscopy 09/19/2007 .................................  [name]  Health Maintenance......................................................Marland KitchenWert  - Td 09/2007  - Pneumovax 05/2003 (second shot)  - CPX 04/25/2013     Family History:  Asthma/emphysema in his father died in [place] at 52  negative for cancer  mother healthy x hbp, chol older  1/2 brother by 11 years and healthy x hbp   Social History:  Former smoker. Quit in 2000,  Only ever socially.  married  2 children  Sales, freq flyer  ETOH- daily, wine              Objective:   Physical Exam  robust   ambulatory white male in no acute distress jumping from one concern to the other  wt 190 April 30, 2009 > 190 July 15, 2009 >  > 183 May 02, 2010 > 186 05/07/2011 >09/07/2011 183 >>182 09/18/2011  > 04/18/2012 183 > 178 09/23/12>178 11/04/2012 > 04/25/2013 180 >  01/19/2014  185 HEENT: nl dentition,  Neck without JVD/Nodes/TM nl carotids without bruit  Lungs trace rhonchi  RRR no s3 or murmur or increase in P2 no displacement pmi, no extra beats  Abd soft and benign with nl excursion in the supine position. No bruits or organomegaly  Ext warm without calf tenderness, cyanosis clubbing or edema, nl pulses  Neuro alert, anxious no deficits  MS nl gait, no restrictions  GU tenderness along R Inguinal canal but no significant hernia apprectiated    CXR 08/18/13 >NAD     Assessment & Plan:

## 2014-01-19 NOTE — Assessment & Plan Note (Signed)
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

## 2014-01-19 NOTE — Assessment & Plan Note (Signed)
-   Onset pain  7/012     - CT abd 12/08/10 neg x hydrocele on R > referred to urology    - CCS Eval ([facility]) 12/16/10 no hernia> pain resolved > referred back 01/19/2014 though I don't feel any bulge or defect, there is def sensitivity.

## 2014-01-19 NOTE — Patient Instructions (Addendum)
zpak and mucinex dm and let me know if not better by 01/29/14  Please see patient coordinator before you leave today  to schedule general surgery eval for R inguinal pain   Please schedule a follow up visit in 3 months but call sooner if needed for cpx

## 2014-02-05 ENCOUNTER — Other Ambulatory Visit (INDEPENDENT_AMBULATORY_CARE_PROVIDER_SITE_OTHER): Payer: Self-pay

## 2014-02-05 DIAGNOSIS — R1031 Right lower quadrant pain: Secondary | ICD-10-CM

## 2014-02-12 ENCOUNTER — Other Ambulatory Visit: Payer: BC Managed Care – PPO

## 2014-02-12 IMAGING — CR DG CHEST 2V
2 series · 2 of 2 positions shown · non-contrast
Comparison: 05/07/2011

CLINICAL DATA: Hypertension.  Smoking history.

CHEST - 2 VIEW

[view not recorded (1 of 2)]
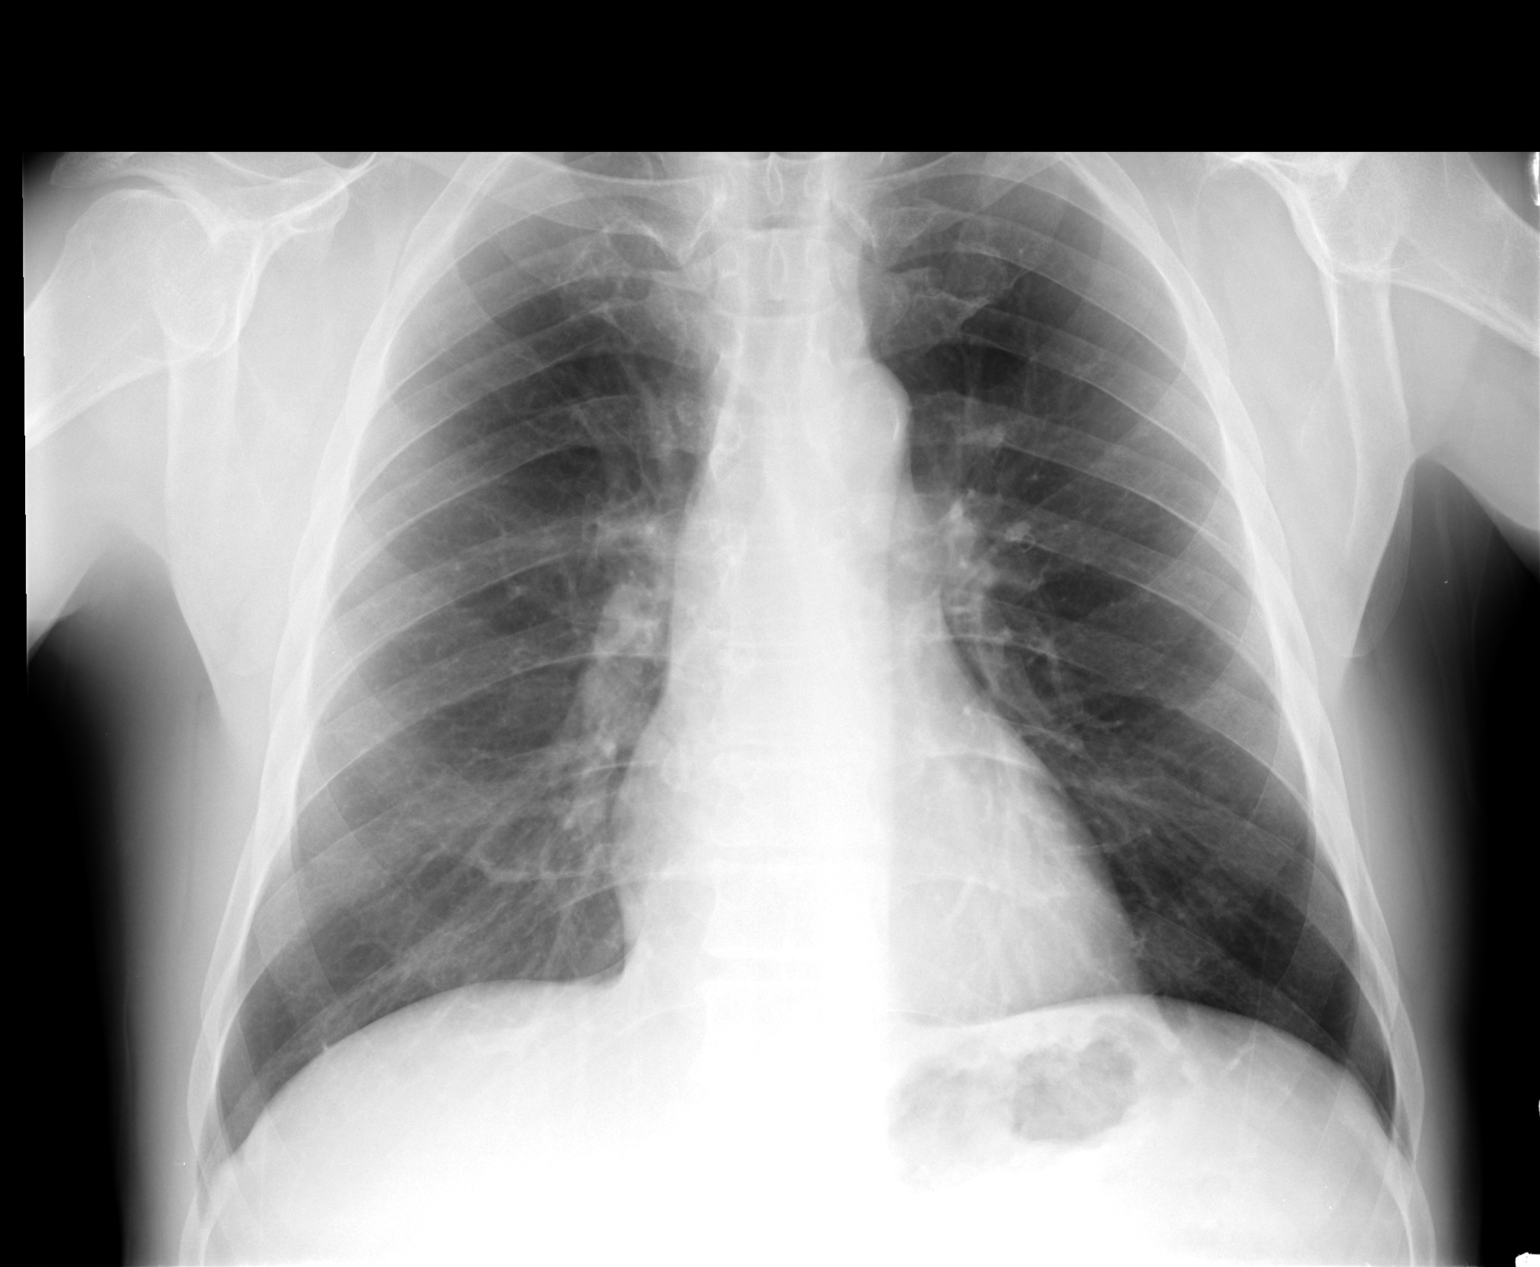

[view not recorded (2 of 2)]
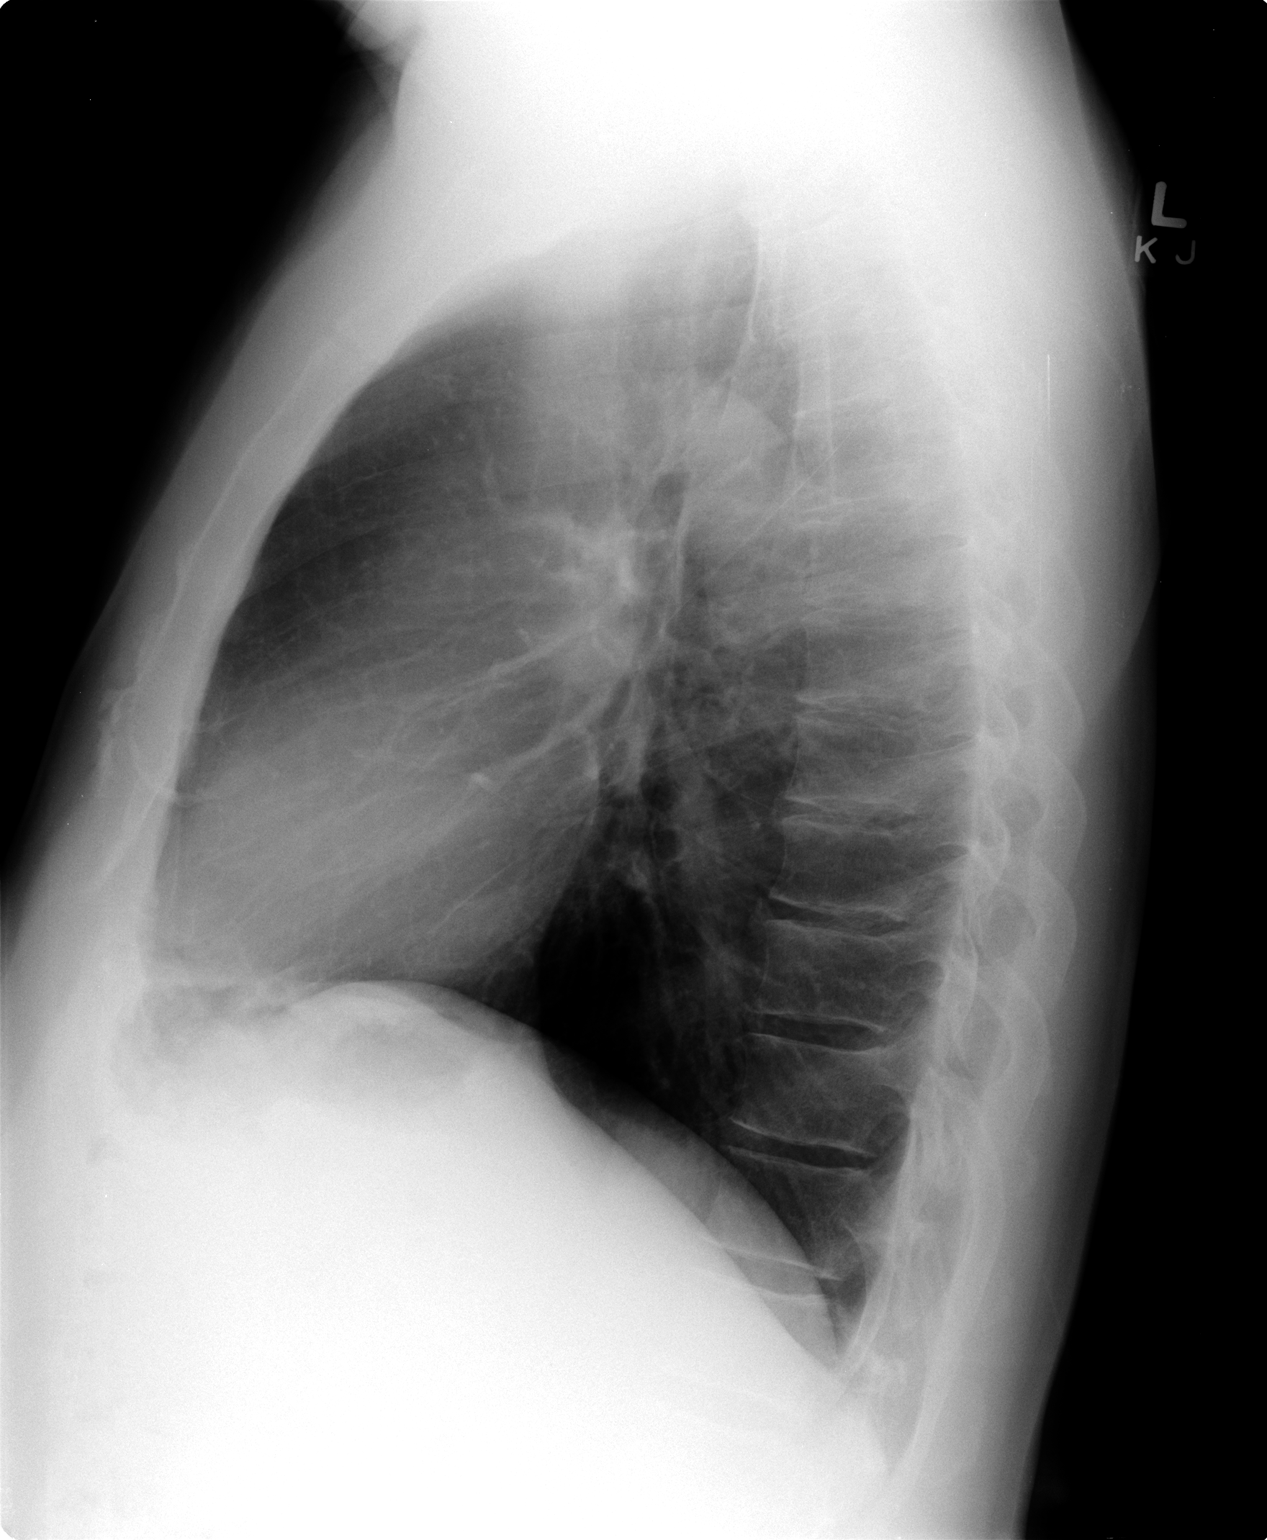

[2 of 2 positions shown; findings below may reference images not displayed]

FINDINGS: Heart size is normal.  Mediastinal shadows are normal
except for early calcification of the aorta.  The pulmonary
vascularity is normal.  The lungs are clear.  No effusions.  No
abnormal bony findings.
IMPRESSION: Normal except for some aortic atherosclerotic calcification.

## 2014-02-19 ENCOUNTER — Ambulatory Visit
Admission: RE | Admit: 2014-02-19 | Discharge: 2014-02-19 | Disposition: A | Discharge status: Home / Self Care | Payer: BC Managed Care – PPO | Source: Ambulatory Visit | Attending: General Surgery | Admitting: General Surgery

## 2014-02-19 DIAGNOSIS — R1031 Right lower quadrant pain: Secondary | ICD-10-CM

## 2014-02-19 MED ORDER — IOHEXOL 300 MG/ML  SOLN
100.0000 mL | Freq: Once | INTRAMUSCULAR | Status: AC | PRN
  Administered 2014-02-19: 100 mL via INTRAVENOUS

## 2014-03-03 ENCOUNTER — Other Ambulatory Visit: Payer: Self-pay | Admitting: Internal Medicine

## 2014-03-12 ENCOUNTER — Telehealth: Payer: Self-pay | Admitting: Gastroenterology

## 2014-03-12 ENCOUNTER — Telehealth: Payer: Self-pay | Admitting: Internal Medicine

## 2014-03-12 DIAGNOSIS — K760 Fatty (change of) liver, not elsewhere classified: Secondary | ICD-10-CM

## 2014-03-12 NOTE — Telephone Encounter (Signed)
Spoke with pt, is requesting a referral to a liver specialist for enlarged liver/fatty liver based on a CT from 02/19/2014.  Last seen 01/19/14.   Dr Melvyn Novas are you ok with this referral?   Thanks!

## 2014-03-12 NOTE — Telephone Encounter (Signed)
That's fine > Dr Fuller Plan is his GI doctor of record

## 2014-03-12 NOTE — Telephone Encounter (Signed)
Spoke with pt, he is aware of recs.  Referral placed.  Nothing further needed at this time.

## 2014-03-13 NOTE — Telephone Encounter (Signed)
Left message for patient to call back  

## 2014-03-13 NOTE — Telephone Encounter (Signed)
Patient has agreed to come in and see Arta Bruce, PA tomorrow at 1:45

## 2014-03-13 NOTE — Telephone Encounter (Signed)
Pt returned call - [phone]

## 2014-03-14 ENCOUNTER — Ambulatory Visit (INDEPENDENT_AMBULATORY_CARE_PROVIDER_SITE_OTHER): Payer: BC Managed Care – PPO | Admitting: Physician Assistant

## 2014-03-14 ENCOUNTER — Encounter: Payer: Self-pay | Admitting: Physician Assistant

## 2014-03-14 VITALS — BP 124/74 | HR 56 | Ht 70.0 in | Wt 184.0 lb

## 2014-03-14 DIAGNOSIS — K760 Fatty (change of) liver, not elsewhere classified: Secondary | ICD-10-CM

## 2014-03-14 NOTE — Progress Notes (Signed)
Reviewed and agree with management plan.  Emmely Bittinger T. Jabaree Mercado, MD FACG 

## 2014-03-14 NOTE — Progress Notes (Signed)
Patient ID: Richard Roach, male   DOB: August 21, 1955, 58 y.o.   MRN: [id number]     History of Present Illness: Richard Roach is a 58 year old male referred by Dr.Wirt for evaluation of fatty liver.  Reports that several weeks ago he was working in his yard and developed lower abdominal pain. He was evaluated by his primary care physician and sent for an abdominal and pelvic CT to evaluate for hernia. He was found to have mild hepatic steatosis. He had blood work that revealed normal liver functions, but he was referred for follow-up. He feels well and has no abdominal pain.  The patient has not been on any new medications, he is not diabetic, he tries to watch his cholesterol. He has not been using any herbal supplements. He does admit that for many years he would have 2 or 3 glasses of wine with dinner every night but for the past 5 or 6 years he has 5 or 6 glasses of wine with dinner every night.   Past Medical History  Diagnosis Date  . Hypercholesterolemia   . Allergic rhinitis   . Hypertension   . Colon polyps     Past Surgical History  Procedure Laterality Date  . Knee arthroscopy  June 2011    right  . Colonoscopy  2013  . Tonsillectomy     Family History  Problem Relation Age of Onset  . Asthma Father   . Colon cancer Neg Hx   . High blood pressure Mother   . Colon polyps Neg Hx   . Diabetes Neg Hx   . Kidney disease Neg Hx   . Esophageal cancer Neg Hx    History  Substance Use Topics  . Smoking status: Former Smoker    Types: Cigarettes    Quit date: 05/11/1998  . Smokeless tobacco: Never Used     Comment: smoked only socially x 5 yrs  . Alcohol Use: 12.6 oz/week    21 Glasses of wine per week     Comment: Occassionally   Current Outpatient Prescriptions  Medication Sig Dispense Refill  . Ascorbic Acid (VITAMIN C) 1000 MG tablet Take 1,000 mg by mouth daily.    Marland Kitchen aspirin 81 MG tablet Take 81 mg by mouth daily.    Marland Kitchen atenolol (TENORMIN) 50 MG tablet Take 50 mg by mouth  daily.    Marland Kitchen atenolol (TENORMIN) 50 MG tablet TAKE 1 TABLET BY MOUTH TWICE DAILY 60 tablet 11  . Multiple Vitamin (MULTIVITAMIN) tablet Take 1 tablet by mouth daily.    Marland Kitchen VIAGRA 100 MG tablet TAKE 1 TABLET BY MOUTH DAILY 1 HOUR BEFORE NEEDED 6 tablet 3  . vitamin E 200 UNIT capsule Take 200 Units by mouth daily.     No current facility-administered medications for this visit.   No Known Allergies    Review of Systems: Gen: Denies any fever, chills, sweats, anorexia, fatigue, weakness, malaise, weight loss, and sleep disorder CV: Denies chest pain, angina, palpitations, syncope, orthopnea, PND, peripheral edema, and claudication. Resp: Denies dyspnea at rest, dyspnea with exercise, cough, sputum, wheezing, coughing up blood, and pleurisy. GI: Denies vomiting blood, jaundice, and fecal incontinence.   Denies dysphagia or odynophagia. GU : Denies urinary burning, blood in urine, urinary frequency, urinary hesitancy, nocturnal urination, and urinary incontinence. MS: Denies joint pain, limitation of movement, and swelling, stiffness, low back pain, extremity pain. Denies muscle weakness, cramps, atrophy.  Derm: Denies rash, itching, dry skin, hives, moles, warts, or unhealing ulcers.  Psych:  Denies depression, anxiety, memory loss, suicidal ideation, hallucinations, paranoia, and confusion. Heme: Denies bruising, bleeding, and enlarged lymph nodes. Neuro:  Denies any headaches, dizziness, paresthesia Endo:  Denies any problems with DM, thyroid, adrenal  LAB RESULTS: Hepatic function testing on 04/25/2013 showed a total bili 1.0, direct bili 0.1, alkaline phosphatase 54, AST 20, ALT 17   Studies:   Ct Abdomen Pelvis W Contrast  02/19/2014   CLINICAL DATA:  Lower abdominal/inguinal region pain primarily on the right for 4 months  EXAM: CT ABDOMEN AND PELVIS WITH CONTRAST  TECHNIQUE: Multidetector CT imaging of the abdomen and pelvis was performed using the standard protocol following bolus  administration of intravenous contrast. Oral contrast was also administered.  CONTRAST:  185mL OMNIPAQUE IOHEXOL 300 MG/ML  SOLN  COMPARISON:  CT pelvis December 11, 2010  FINDINGS: Lung bases are clear.  Liver is prominent, measuring 18.1 cm in length. There is mild hepatic steatosis. No focal liver lesions are identified. Gallbladder wall is not thickened. There is no appreciable biliary duct dilatation.  Spleen, pancreas, and adrenals appear normal. Kidneys bilaterally show no mass or hydronephrosis on either side. There is no renal or ureteral calculus on either side.  In the pelvis, urinary bladder is midline. Urinary bladder wall thickness is normal. There is a small calcification in the left side of the prostate. There is a small amount of fat in each inguinal ring. There is no pelvic mass or fluid. Terminal ileum appears normal. Appendix appears normal. There appears to be mild lipomatous infiltration of the ileocecal valve, a finding present previously.  There is no appreciable bowel obstruction. No free air or portal venous air.  There is no ascites, adenopathy, or abscess in the abdomen or pelvis. There is no demonstrable abdominal aortic aneurysm. There are no blastic or lytic bone lesions.  IMPRESSION: Liver prominent with mild hepatic steatosis.  No renal or ureteral calculus.  No hydronephrosis.  No abscess. No mesenteric inflammation. Appendix region appears normal.  There is a small amount of fat in each inguinal ring but no inflammation in these regions.   Electronically Signed   By: Lowella Grip M.D.   On: 02/19/2014 12:09   PROCEDURES: Colonoscopy on 09/27/2012 showed a sessile polyp measuring 6 mm in the transverse colon, polypectomy performed with a cold snare. Sessile polyp measuring 5 mm in the sigmoid colon, polypectomy performed with a cold snare. Small internal hemorrhoids. He was advised to have a repeat colonoscopy in 5 years.   Physical Exam: General: Pleasant, well developed ,  pleasant male in no acute distress Head: Normocephalic and atraumatic Eyes:  sclerae anicteric, conjunctiva pink  Ears: Normal auditory acuityindication Lungs: Clear throughout to auscultation Heart: Regular rate and rhythm Abdomen: Soft, non distended, non-tender. No masses, liver edge palpable 2 FB below costal margin. Normal bowel sound Musculoskeletal: Symmetrical with no gross deformities  Extremities: No edema  Neurological: Alert oriented x 4, grossly nonfocal Psychological:  Alert and cooperative. Normal mood and affect  Assessment and Recommendations: A 58 year old male with a long-standing history of daily alcohol intake, recently found to have mild hepatic steatosis referred for evaluation. His fatty liver is likely directly correlated to his alcohol intake, and he's been advised to cut his alcohol intake back to one glass of wine a day. As the patient has done extensive reading on Web M.D., he is requesting an ultrasound. It was explained to the patient that the ultrasound may not provide any additional information and the CT has but  nonetheless an ultrasound has been ordered for further evaluation of the liver. A lipid panel, hepatitis panel, GGT, CBC, ferritin, iron, and comprehensive metabolic panel have been ordered. The patient will be scheduled for a dietary consult for further direction as to an appropriate low-fat diet as he and his wife have a multitude of questions regarding better food choices. Further recommendations will be made pending the findings of his testing. He will follow up in 4-6 months.        Ellyce Lafevers, Deloris Ping 03/14/2014, Pager [phone]

## 2014-03-14 NOTE — Patient Instructions (Addendum)
Your physician has requested that you go to the basement for  lab work tomorrow morning fasting.  You have been scheduled for an abdominal ultrasound at [facility] (1st floor of hospital) on 03/16/14 at 7:30 am. Please arrive 15 minutes prior to your appointment for registration, arrive at 3:83 am. Make certain not to have anything to eat or drink 6 hours prior to your appointment. Should you need to reschedule your appointment, please contact radiology at [phone]. This test typically takes about 30 minutes to perform.  A referral was put in for a dietary consult and they will be contacting you to schedule an appointment.  Please decrease your alcohol intake.  You will be due for a recall office visit in 07/2014. We will send you a reminder in the mail when it gets closer to that time.

## 2014-03-15 ENCOUNTER — Telehealth: Payer: Self-pay | Admitting: Physician Assistant

## 2014-03-15 ENCOUNTER — Telehealth: Payer: Self-pay | Admitting: *Deleted

## 2014-03-15 ENCOUNTER — Other Ambulatory Visit (INDEPENDENT_AMBULATORY_CARE_PROVIDER_SITE_OTHER): Payer: BC Managed Care – PPO

## 2014-03-15 DIAGNOSIS — K760 Fatty (change of) liver, not elsewhere classified: Secondary | ICD-10-CM

## 2014-03-15 LAB — HEPATIC FUNCTION PANEL
ALT: 17 U/L (ref 0–53)
AST: 22 U/L (ref 0–37)
Albumin: 3.5 g/dL (ref 3.5–5.2)
Alkaline Phosphatase: 59 U/L (ref 39–117)
BILIRUBIN TOTAL: 1.2 mg/dL (ref 0.2–1.2)
Bilirubin, Direct: 0.2 mg/dL (ref 0.0–0.3)
Total Protein: 7.3 g/dL (ref 6.0–8.3)

## 2014-03-15 LAB — CBC WITH DIFFERENTIAL/PLATELET
Basophils Absolute: 0 10*3/uL (ref 0.0–0.1)
Basophils Relative: 0.3 % (ref 0.0–3.0)
Eosinophils Absolute: 0.1 10*3/uL (ref 0.0–0.7)
Eosinophils Relative: 2.2 % (ref 0.0–5.0)
HEMATOCRIT: 47.9 % (ref 39.0–52.0)
HEMOGLOBIN: 16 g/dL (ref 13.0–17.0)
LYMPHS ABS: 1.3 10*3/uL (ref 0.7–4.0)
Lymphocytes Relative: 23.5 % (ref 12.0–46.0)
MCHC: 33.5 g/dL (ref 30.0–36.0)
MCV: 91.7 fl (ref 78.0–100.0)
MONO ABS: 0.5 10*3/uL (ref 0.1–1.0)
MONOS PCT: 9.7 % (ref 3.0–12.0)
NEUTROS ABS: 3.5 10*3/uL (ref 1.4–7.7)
Neutrophils Relative %: 64.3 % (ref 43.0–77.0)
Platelets: 268 10*3/uL (ref 150.0–400.0)
RBC: 5.22 Mil/uL (ref 4.22–5.81)
RDW: 13.1 % (ref 11.5–15.5)
WBC: 5.5 10*3/uL (ref 4.0–10.5)

## 2014-03-15 LAB — COMPREHENSIVE METABOLIC PANEL
ALT: 17 U/L (ref 0–53)
AST: 22 U/L (ref 0–37)
Albumin: 3.5 g/dL (ref 3.5–5.2)
Alkaline Phosphatase: 59 U/L (ref 39–117)
BUN: 16 mg/dL (ref 6–23)
CALCIUM: 9.3 mg/dL (ref 8.4–10.5)
CHLORIDE: 105 meq/L (ref 96–112)
CO2: 29 meq/L (ref 19–32)
CREATININE: 1.2 mg/dL (ref 0.4–1.5)
GFR: 64.47 mL/min (ref >60.00)
Glucose, Bld: 98 mg/dL (ref 70–99)
Potassium: 4.3 mEq/L (ref 3.5–5.1)
Sodium: 140 mEq/L (ref 135–145)
Total Bilirubin: 1.2 mg/dL (ref 0.2–1.2)
Total Protein: 7.3 g/dL (ref 6.0–8.3)

## 2014-03-15 LAB — LIPID PANEL
CHOL/HDL RATIO: 4
Cholesterol: 198 mg/dL (ref 0–200)
HDL: 46.1 mg/dL (ref >39.00)
LDL CALC: 138 mg/dL — AB (ref 0–99)
NONHDL: 151.9
Triglycerides: 68 mg/dL (ref 0.0–149.0)
VLDL: 13.6 mg/dL (ref 0.0–40.0)

## 2014-03-15 LAB — FERRITIN: Ferritin: 283.8 ng/mL (ref 22.0–322.0)

## 2014-03-15 LAB — IRON: Iron: 74 ug/dL (ref 42–165)

## 2014-03-15 LAB — GAMMA GT: GGT: 17 U/L (ref 7–51)

## 2014-03-15 NOTE — Telephone Encounter (Signed)
Patient would Cecille Rubin Hvozdovic, PA-C to call him when she gets a chance just to discuss a few questions. He would not elaborate.

## 2014-03-15 NOTE — Telephone Encounter (Signed)
Please let pt know I am out of office til Monday

## 2014-03-15 NOTE — Telephone Encounter (Signed)
See previous note

## 2014-03-16 ENCOUNTER — Ambulatory Visit (HOSPITAL_COMMUNITY)
Admission: RE | Admit: 2014-03-16 | Discharge: 2014-03-16 | Disposition: A | Discharge status: Home / Self Care | Payer: BC Managed Care – PPO | Source: Ambulatory Visit | Attending: Physician Assistant | Admitting: Physician Assistant

## 2014-03-16 ENCOUNTER — Telehealth: Payer: Self-pay | Admitting: Physician Assistant

## 2014-03-16 ENCOUNTER — Telehealth: Payer: Self-pay | Admitting: Internal Medicine

## 2014-03-16 DIAGNOSIS — K760 Fatty (change of) liver, not elsewhere classified: Secondary | ICD-10-CM | POA: Diagnosis not present

## 2014-03-16 NOTE — Telephone Encounter (Signed)
See results note. 

## 2014-03-16 NOTE — Telephone Encounter (Signed)
Patient notified that Cecille Rubin Hvozdovic, PA-C is out of the office until Monday.

## 2014-03-16 NOTE — Telephone Encounter (Signed)
Patient is calling to see if he can get [place] results today. He states he is flying out of town tomorrow and is anxious to hear.

## 2014-03-16 NOTE — Telephone Encounter (Signed)
I sent you results earlier--u/s ok except for changes of fatty liver.

## 2014-03-16 NOTE — Telephone Encounter (Signed)
Spoke to pt and informed him of ultrasound and lab results. Pt had many questions regarding wine consumption. Pt was advised to decrease alcohol intake to one glass of wine daily, less if possible.

## 2014-04-03 ENCOUNTER — Encounter: Payer: Self-pay | Admitting: Physician Assistant

## 2014-04-04 ENCOUNTER — Telehealth: Payer: Self-pay | Admitting: *Deleted

## 2014-04-04 NOTE — Telephone Encounter (Signed)
-----   Message from Vita Barley Hvozdovic, PA-C sent at 04/04/2014 12:58 PM EST ----- Rollene Fare, Could you please call this patient today and set him up with an appt to see Dr Fuller Plan (see email). Thank you very much. Cecille Rubin

## 2014-04-04 NOTE — Telephone Encounter (Signed)
Spoke with patient and scheduled with Dr. Fuller Plan on 05/09/14 at 3:15 PM. Mailed out copies of radiology reports.

## 2014-04-13 ENCOUNTER — Telehealth: Payer: Self-pay | Admitting: Cardiology

## 2014-04-13 NOTE — Telephone Encounter (Signed)
Spoke with pt, Follow up scheduled  

## 2014-04-13 NOTE — Telephone Encounter (Signed)
New message         Unable to transfer call to [facility] due to phone issues at [address] / please give pt a call right away / pt has experienced some cp but pt states he is ok now

## 2014-04-13 NOTE — Telephone Encounter (Signed)
Per Teresa-this was sent to [facility] in error, she has sent this message  to [facility].

## 2014-04-13 NOTE — Telephone Encounter (Signed)
PAOV Richard Roach  

## 2014-04-13 NOTE — Telephone Encounter (Signed)
Another telephone message started

## 2014-04-13 NOTE — Telephone Encounter (Signed)
New Message        Pt calling c/o sob and left arm pain. Please advise.

## 2014-04-13 NOTE — Telephone Encounter (Signed)
Disconnected , RN called patient Spoke to patient. He states   For a couple of weeks he has noticed he becomes short of breahte with walking short distance like   taking trash to the curb and walking up the hill back to the house. He states he has some pain on right side breast and shoulder occassional  Patient wanted to know if he need to has some kind of test to monitor. No discomfort at present time. Will defer to Dr Stanford Breed?

## 2014-04-23 ENCOUNTER — Other Ambulatory Visit (INDEPENDENT_AMBULATORY_CARE_PROVIDER_SITE_OTHER): Payer: BC Managed Care – PPO

## 2014-04-23 ENCOUNTER — Ambulatory Visit (INDEPENDENT_AMBULATORY_CARE_PROVIDER_SITE_OTHER)
Admission: RE | Admit: 2014-04-23 | Discharge: 2014-04-23 | Disposition: A | Discharge status: Home / Self Care | Payer: BC Managed Care – PPO | Source: Ambulatory Visit | Attending: Internal Medicine | Admitting: Internal Medicine

## 2014-04-23 ENCOUNTER — Encounter: Payer: Self-pay | Admitting: Internal Medicine

## 2014-04-23 ENCOUNTER — Ambulatory Visit (INDEPENDENT_AMBULATORY_CARE_PROVIDER_SITE_OTHER): Payer: BC Managed Care – PPO | Admitting: Internal Medicine

## 2014-04-23 VITALS — BP 100/70 | HR 50 | Ht 70.0 in | Wt 179.0 lb

## 2014-04-23 DIAGNOSIS — Z0000 Encounter for general adult medical examination without abnormal findings: Secondary | ICD-10-CM

## 2014-04-23 DIAGNOSIS — J309 Allergic rhinitis, unspecified: Secondary | ICD-10-CM

## 2014-04-23 DIAGNOSIS — I10 Essential (primary) hypertension: Secondary | ICD-10-CM

## 2014-04-23 DIAGNOSIS — H6123 Impacted cerumen, bilateral: Secondary | ICD-10-CM

## 2014-04-23 DIAGNOSIS — R1084 Generalized abdominal pain: Secondary | ICD-10-CM

## 2014-04-23 DIAGNOSIS — E7800 Pure hypercholesterolemia, unspecified: Secondary | ICD-10-CM

## 2014-04-23 LAB — URINALYSIS
Bilirubin Urine: NEGATIVE
Hgb urine dipstick: NEGATIVE
Ketones, ur: NEGATIVE
LEUKOCYTES UA: NEGATIVE
Nitrite: NEGATIVE
PH: 6.5 (ref 5.0–8.0)
SPECIFIC GRAVITY, URINE: 1.015 (ref 1.000–1.030)
TOTAL PROTEIN, URINE-UPE24: NEGATIVE
Urine Glucose: NEGATIVE
Urobilinogen, UA: 0.2 (ref 0.0–1.0)

## 2014-04-23 LAB — HEPATIC FUNCTION PANEL
ALBUMIN: 3.8 g/dL (ref 3.5–5.2)
ALT: 24 U/L (ref 0–53)
AST: 23 U/L (ref 0–37)
Alkaline Phosphatase: 59 U/L (ref 39–117)
BILIRUBIN TOTAL: 0.8 mg/dL (ref 0.2–1.2)
Bilirubin, Direct: 0.1 mg/dL (ref 0.0–0.3)
Total Protein: 6.4 g/dL (ref 6.0–8.3)

## 2014-04-23 LAB — CBC WITH DIFFERENTIAL/PLATELET
Basophils Absolute: 0 10*3/uL (ref 0.0–0.1)
Basophils Relative: 0.5 % (ref 0.0–3.0)
Eosinophils Absolute: 0.2 10*3/uL (ref 0.0–0.7)
Eosinophils Relative: 3 % (ref 0.0–5.0)
HEMATOCRIT: 47.1 % (ref 39.0–52.0)
HEMOGLOBIN: 15.7 g/dL (ref 13.0–17.0)
Lymphocytes Relative: 26.2 % (ref 12.0–46.0)
Lymphs Abs: 1.6 10*3/uL (ref 0.7–4.0)
MCHC: 33.2 g/dL (ref 30.0–36.0)
MCV: 91.8 fl (ref 78.0–100.0)
MONO ABS: 0.5 10*3/uL (ref 0.1–1.0)
Monocytes Relative: 8.4 % (ref 3.0–12.0)
NEUTROS ABS: 3.8 10*3/uL (ref 1.4–7.7)
Neutrophils Relative %: 61.9 % (ref 43.0–77.0)
PLATELETS: 236 10*3/uL (ref 150.0–400.0)
RBC: 5.13 Mil/uL (ref 4.22–5.81)
RDW: 13.1 % (ref 11.5–15.5)
WBC: 6.2 10*3/uL (ref 4.0–10.5)

## 2014-04-23 LAB — LIPID PANEL
CHOLESTEROL: 186 mg/dL (ref 0–200)
HDL: 45.1 mg/dL (ref >39.00)
LDL Cholesterol: 131 mg/dL — ABNORMAL HIGH (ref 0–99)
NonHDL: 140.9
Total CHOL/HDL Ratio: 4
Triglycerides: 51 mg/dL (ref 0.0–149.0)
VLDL: 10.2 mg/dL (ref 0.0–40.0)

## 2014-04-23 LAB — BASIC METABOLIC PANEL
BUN: 21 mg/dL (ref 6–23)
CO2: 29 mEq/L (ref 19–32)
Calcium: 9.3 mg/dL (ref 8.4–10.5)
Chloride: 106 mEq/L (ref 96–112)
Creatinine, Ser: 1.1 mg/dL (ref 0.4–1.5)
GFR: 71.81 mL/min (ref >60.00)
GLUCOSE: 92 mg/dL (ref 70–99)
POTASSIUM: 4.7 meq/L (ref 3.5–5.1)
Sodium: 139 mEq/L (ref 135–145)

## 2014-04-23 LAB — PSA: PSA: 1.51 ng/mL (ref 0.10–4.00)

## 2014-04-23 LAB — TSH: TSH: 1.67 u[IU]/mL (ref 0.35–4.50)

## 2014-04-23 MED ORDER — ATENOLOL 25 MG PO TABS: ORAL_TABLET | ORAL | Status: DC

## 2014-04-23 NOTE — Progress Notes (Signed)
Subjective:    Patient ID: Richard Roach, male    DOB: 17-Dec-1955    MRN: [id number]     Brief patient profile:  24 yowm with minimum smoking history and documented chronic rhinitis with allergies to dust and mold cats with a recurrent pattern of nonspecific rhinitis triggered by viral upper respiratory infections or airplane travel not compliant with nasal steroids and recurrent R inguinal pain first develped summer of 2012 with neg w/u for significant IH by Dr Richard Roach      Brief patient profile:  04/18/2012  CPX  With multiple chronic concerns 1) tingling mid back, did not discuss this with neurology on recent eval 2) timing for colonoscopy for polyps > in computer for recall 3) urinary hesistancy, did not discuss this with  urology 4) freq uri/ rhinitis/sinusitis ,  Not maintaining on nasonex, did not remember instructions re appropriate use of afrin x 5 days only rec Stay on nasal steroids, avoid antihistamines, f/u with urology prn       01/19/2014 acute ov/Richard Roach re: recurrent R IH pain Chief Complaint  Patient presents with  . Acute Visit    Pt c/o right low abd pain and groin pain x 1 wk- notices p doing some yard work.  He also c/o "cold"- minimal sore throat and "stopped up head"- x 2 days.   No pain with cough, mainly only if pushs on inguinal area. No gi/ gu symptoms at all.  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 rec zpak and mucinex dm and let me know if not better by 01/29/14 Please see patient coordinator before you leave today  to schedule general surgery eval for R inguinal pain    04/23/2014 f/u ov/Richard Roach re: cpx  Chief Complaint  Patient presents with  . Annual Exam    Pt fasting today. Pt c/o lower right abd pain for the past few months- states dxed with fatty liver.    Chronic RUQ pain Does not bother him lying flat but does so when tries to do sit ups.  Called cards with concerns going up inclines but note  Does ex    On treadmill s cp/sob though , not consistently  and stops for a while notes sob when restarts "? Is that nl" never cp on treadmill    No obvious day to day or daytime variabilty or assoc   cp or chest tightness, subjective wheeze overt  hb symptoms. No unusual exp hx or h/o childhood pna/ asthma or knowledge of premature birth.  Sleeping ok without nocturnal  or early am exacerbation  of respiratory  c/o's or need for noct saba. Also denies any obvious fluctuation of symptoms with weather or environmental changes or other aggravating or alleviating factors except as outlined above   Current Medications, Allergies, Complete Past Medical History, Past Surgical History, Family History, and Social History were reviewed in [employer] record.  ROS  The following are not active complaints unless bolded sore throat, dysphagia, dental problems, itching, sneezing,  nasal congestion or excess/ purulent secretions, ear ache,   fever, chills, sweats, unintended wt loss, pleuritic or exertional cp, hemoptysis,  orthopnea pnd or leg swelling, presyncope, palpitations, heartburn, abdominal pain, anorexia, nausea, vomiting, diarrhea  or change in bowel or urinary habits, change in stools or urine, dysuria,hematuria,  rash, arthralgias, visual complaints, headache, numbness weakness or ataxia or problems with walking or coordination,  change in mood/affect or memory.                    Marland Kitchen  Past Medical History:  HYPERCHOLESTEROLEMIA (ICD-272.0)  - Target LDL < 130 (Type A plus male gender)  ALLERGIC RHINITIS, CHRONIC (ICD-477.9)  - Chronic rhinitis flyers reviewed 05/27/99, 05/16/02, 12/23/04 and 03/11/06  - Pt denied ever receiving a chronic rhinitis flyer July 15, 2009, not using it March 26, 2010  - Chronic rhinitis flyers reviewed 05/27/99, 05/16/02, 12/23/04 and 03/11/06  - Pt denied ever receiving a chronic rhinitis flyer July 15, 2009, not using it March 26, 2010  BPH  ....................................................................... [name] (ICD-401.9)  Colon Polyps  - See Colonoscopy 09/19/2007 .................................  [facility]  Health Maintenance......................................................Marland KitchenWert  - Td 09/2007  - Pneumovax 05/2003 (second shot)  - CPX 04/23/2014     Family History:  Asthma/emphysema in his father died in [place] at 44  negative for cancer  mother healthy x hbp, chol older  1/2 brother by 11 years and healthy x hbp   Social History:  Former smoker. Quit in 2000,  Only ever socially.  married  2 children  Sales, freq flyer  ETOH- daily, wine              Objective:   Physical Exam  robust   ambulatory white male in no acute distress jumping from one concern to the other in a continuous loop Not bp and pulse low on tenormin 50 mg daily already took today's dose   wt 190 April 30, 2009 > 190 July 15, 2009 >  > 183 May 02, 2010 > 186 05/07/2011 >09/07/2011 183 >>182 09/18/2011  > 04/18/2012 183 > 178 09/23/12>178 11/04/2012 > 04/25/2013 180 >  01/19/2014  185> 04/23/2014 179 HEENT: nl dentition,  Neck without JVD/Nodes/TM nl carotids without bruit  Lungs trace rhonchi  RRR no s3 or murmur or increase in P2 no displacement pmi, no extra beats  Abd soft and benign with nl excursion in the supine position. No bruits or organomegaly  Ext warm without calf tenderness, cyanosis clubbing or edema, nl pulses  Neuro alert, anxious no deficits  MS nl gait, no restrictions  GU tenderness along R Inguinal canal but no significant hernia apprectiated   Rectal:  Mild bph/ no nodules, stool g neg    Recent Labs Lab 04/23/14 0947  NA 139  K 4.7  CL 106  CO2 29  BUN 21  CREATININE 1.1  GLUCOSE 92    Recent Labs Lab 04/23/14 0947  HGB 15.7  HCT 47.1  WBC 6.2  PLT 236.0     Lab Results  Component Value Date   TSH 1.67 04/23/2014     cxr 04/23/14  wnl         Assessment &  Plan:

## 2014-04-23 NOTE — Progress Notes (Signed)
Subjective:    Patient ID: Richard Roach, male    DOB: 1955/08/23    MRN: [id number]     Brief patient profile:  30 yowm with minimum smoking history and documented chronic rhinitis with allergies to dust and mold cats with a recurrent pattern of nonspecific rhinitis triggered by viral upper respiratory infections or airplane travel not compliant with nasal steroids and recurrent R inguinal pain first develped summer of 2012 with neg w/u for significant IH by Dr Margot Chimes      Brief patient profile:  04/18/2012  CPX  With multiple chronic concerns 1) tingling mid back, did not discuss this with neurology on recent eval 2) timing for colonoscopy for polyps > in computer for recall 3) urinary hesistancy, did not discuss this with  urology 4) freq uri/ rhinitis/sinusitis ,  Not maintaining on nasonex, did not remember instructions re appropriate use of afrin x 5 days only rec Stay on nasal steroids, avoid antihistamines, f/u with urology prn    4/101/5 Acute OV  Complains of 1 week of cough , congestion w/ thick yellow mucus.  He was called in zpak, and has finished this without much improvement .  Leaving on vacation next week for [place] does not want to be sick.  Pt has edema and errythema on left elbow x 3 days. No known injury , no change in ROM .  Tender to touch. No fever. Does prop arm up a lot for computer work and driving.  rec Augmentin 875mg  Twice daily  For 7 days -take w/ food.  Prednisone taper over next week Mucinex DM Twice daily  As needed  Cough      04/23/2014 acute ov/Kamren Heskett re: recurrent R IH pain Chief Complaint  Patient presents with  . Annual Exam    Pt fasting today. Pt c/o lower right abd pain for the past few months- states dxed with fatty liver.    No pain with cough, mainly only if pushs on inguinal area. No gi/ gu symptoms at all.  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   No obvious day to day or daytime  variabilty or assoc   cp or chest tightness, subjective wheeze overt  hb symptoms. No unusual exp hx or h/o childhood pna/ asthma or knowledge of premature birth.  Sleeping ok without nocturnal  or early am exacerbation  of respiratory  c/o's or need for noct saba. Also denies any obvious fluctuation of symptoms with weather or environmental changes or other aggravating or alleviating factors except as outlined above   Current Medications, Allergies, Complete Past Medical History, Past Surgical History, Family History, and Social History were reviewed in [employer] record.  ROS  The following are not active complaints unless bolded sore throat, dysphagia, dental problems, itching, sneezing,  nasal congestion or excess/ purulent secretions, ear ache,   fever, chills, sweats, unintended wt loss, pleuritic or exertional cp, hemoptysis,  orthopnea pnd or leg swelling, presyncope, palpitations, heartburn, abdominal pain, anorexia, nausea, vomiting, diarrhea  or change in bowel or urinary habits, change in stools or urine, dysuria,hematuria,  rash, arthralgias, visual complaints, headache, numbness weakness or ataxia or problems with walking or coordination,  change in mood/affect or memory.                    .  Past Medical History:  HYPERCHOLESTEROLEMIA (ICD-272.0)  - Target LDL < 130 (Type A plus male gender)  ALLERGIC RHINITIS,  CHRONIC (ICD-477.9)  - Chronic rhinitis flyers reviewed 05/27/99, 05/16/02, 12/23/04 and 03/11/06  - Pt denied ever receiving a chronic rhinitis flyer July 15, 2009, not using it March 26, 2010  - Chronic rhinitis flyers reviewed 05/27/99, 05/16/02, 12/23/04 and 03/11/06  - Pt denied ever receiving a chronic rhinitis flyer July 15, 2009, not using it March 26, 2010  BPH ....................................................................... [name] (ICD-401.9)  Colon Polyps  - See Colonoscopy 09/19/2007 .................................   [facility]  Health Maintenance......................................................Marland KitchenWert  - Td 09/2007  - Pneumovax 05/2003 (second shot)  - CPX 04/25/2013     Family History:  Asthma/emphysema in his father died in [place] at 58  negative for cancer  mother healthy x hbp, chol older  1/2 brother by 11 years and healthy x hbp   Social History:  Former smoker. Quit in 2000,  Only ever socially.  married  2 children  Sales, freq flyer  ETOH- daily, wine              Objective:   Physical Exam  robust   ambulatory white male in no acute distress jumping from one concern to the other  wt 190 April 30, 2009 > 190 July 15, 2009 >  > 183 May 02, 2010 > 186 05/07/2011 >09/07/2011 183 >>182 09/18/2011  > 04/18/2012 183 > 178 09/23/12>178 11/04/2012 > 04/25/2013 180 >  04/23/2014  185 HEENT: nl dentition,  Neck without JVD/Nodes/TM nl carotids without bruit  Lungs trace rhonchi  RRR no s3 or murmur or increase in P2 no displacement pmi, no extra beats  Abd soft and benign with nl excursion in the supine position. No bruits or organomegaly  Ext warm without calf tenderness, cyanosis clubbing or edema, nl pulses  Neuro alert, anxious no deficits  MS nl gait, no restrictions  GU tenderness along R Inguinal canal but no significant hernia apprectiated    CXR 08/18/13 >NAD     Assessment & Plan:

## 2014-04-23 NOTE — Progress Notes (Signed)
Quick Note:  Spoke with pt and notified of results per Dr. Wert. Pt verbalized understanding and denied any questions.  ______ 

## 2014-04-23 NOTE — Patient Instructions (Addendum)
Reduce the tenormin to 25 mg daily   Classic subdiaphragmatic pain pattern suggests ibs:  Stereotypical, migratory with a very limited distribution of pain locations, daytime, not exacerbated by ex or coughing, worse in sitting position, associated with generalized abd bloating, not present supine due to the dome effect of the diaphragm is  canceled in that position. Frequently these patients have had multiple negative GI workups and CT scans.  Treatment consists of avoiding foods that cause gas (especially beans and raw vegetables like spinach and salads and boiled eggs )  and citrucel 1 heaping tsp twice daily with a large glass of water.  Pain should improve w/in 2 weeks and if not then consider further GI work up.     Please see patient coordinator before you leave today  to schedule primary care evaluation for your blood pressure  Please remember to go to the lab and x-ray department downstairs for your tests - we will call you with the results when they are available.

## 2014-04-24 ENCOUNTER — Encounter: Payer: BC Managed Care – PPO | Attending: Physician Assistant | Admitting: Dietician

## 2014-04-24 ENCOUNTER — Encounter: Payer: Self-pay | Admitting: Dietician

## 2014-04-24 ENCOUNTER — Encounter: Payer: Self-pay | Admitting: Internal Medicine

## 2014-04-24 VITALS — Ht 70.0 in | Wt 179.9 lb

## 2014-04-24 DIAGNOSIS — Z713 Dietary counseling and surveillance: Secondary | ICD-10-CM | POA: Insufficient documentation

## 2014-04-24 DIAGNOSIS — K760 Fatty (change of) liver, not elsewhere classified: Secondary | ICD-10-CM | POA: Diagnosis not present

## 2014-04-24 NOTE — Assessment & Plan Note (Signed)
Resolved, encouraged to keep up with otc's and f/u prn with Tammy P  NP for ear washing

## 2014-04-24 NOTE — Assessment & Plan Note (Signed)
He needs to establish with primary care and have his polysomatic (?anxiety) complaints addressed to avoid overuse / exp to RT etc  He is uptodate on all vaccinations  Needs to drink less and ex more > discussed

## 2014-04-24 NOTE — Patient Instructions (Signed)
Drink sugar free beverages (coffee, tea, water). (Limit juice). Limit wine to 0-1 drink most nights.  Have protein with all meals and snacks (eggs, nuts, cheese, fish, meat, yogurt). [place] Protein bars for snacks.  Limit starches to a quarter of your plate (size of the palm of your hand). Aim to fill half of your plate with vegetables at lunch and dinner. Keep bagged salads and frozen vegetables around to add to meals.  Ask waiter to not put bread on table if you don't want it.  Use a small plate for meals. Cut meals in half if you go out to eat.  Continue exercising. Increase duration, intensity, or frequency.

## 2014-04-24 NOTE — Assessment & Plan Note (Signed)
-   Onset pain  7/012     - CT abd 12/08/10 neg x hydrocele on R > referred to urology    - [facility] Eval (Streck) 12/16/10 no hernia> referred back 01/19/2014 > fatty liver only objective problem > referred back to GI/ Fuller Plan  stronly suspect this is just an unusual form of IBS > rec citrucel and diet and f/u with Dr Fuller Plan

## 2014-04-24 NOTE — Assessment & Plan Note (Signed)
-   Target LDL < 130 due to hbp/ typeA personality  Lab Results  Component Value Date   CHOL 186 04/23/2014   HDL 45.10 04/23/2014   LDLCALC 131* 04/23/2014   LDLDIRECT 164.0 04/18/2012   TRIG 51.0 04/23/2014   CHOLHDL 4 04/23/2014     Adequate control on present rx, reviewed > no change in rx needed  > diet and ex

## 2014-04-24 NOTE — Progress Notes (Signed)
Medical Nutrition Therapy:  Appt start time: 0830 end time:  0945.   Assessment:  Primary concerns today: Richard Roach is here today since he was diagnosed with a fatty liver. States that he has drank too much in the past. Has cut back to a glass of wine a night (or none) for the past month. LDL was slightly elevated. Will be seeing a Dr. Fuller Plan at the end of the month for follow up.  In the past month has started drink grape juice, orange juice, and eating fruit instead of drinking wine. Does not add sugar or salt to food or drinks. Has cut back on sweets on the road. Has had a hx of colon polyps. Has cut back on red meat. Lives with his wife who does most of the shopping and cooking. Does not miss any meals. Travels for work about 2 weeks per month and tries to eat a lot of fish. When at home will go to [employer] or a salad bar with his wife (eats out 7 x week when at home).   Started working out 2 months ago.    Preferred Learning Style:   No preference indicated   Learning Readiness:   Ready  MEDICATIONS: see list   DIETARY INTAKE:  Usual eating pattern includes 3 meals and 3 snacks per day.  Avoided foods include: bell peppers, onions, brussels sprouts, okra, fried foods    24-hr recall:  B ( AM): fruit (blueberries, banana), Special with berries or granola with skim or Cheerios or yogurt (rare) with 16 oz juice (orange, grape, apple) with 3-4 cups coffee and fat free creamer  Snk ( AM): apple or tangerine/clementine   L ( PM): [employer] 3 x week salad with veggies, [place] dressing (3 scoops), nuts/granola, cheese, with crackers with ice cream or peanut butter on whole wheat bread Snk ( PM): pistachios or pecans or popcorn or parmesan cheese with 16 oz juice D (830 PM): grilled chicken breast, asparagus, baked potato or sweet or salmon with broccoli or spaghetti with meatballs Snk ( PM): graham cookie Beverages: 16 oz juice, 17 oz water, 3-4 cups coffee, unsweet tea, 1-2 6 oz glass of wine  most nights   Usual physical activity: 15 minute elliptical, small amount weights every other day (30-40 minutes), and does yard work at home  Estimated energy needs: 2000 calories 225 g carbohydrates 150 g protein 56 g fat  Progress Towards Goal(s):  In progress.   Nutritional Diagnosis:  NB-1.1 Food and nutrition-related knowledge deficit As related to hx of excess consumption of alcohol and fruit juice.  As evidenced by diagnosis of fatty liver.    Intervention:  Nutrition counseling provided. Plan: Drink sugar free beverages (coffee, tea, water). (Limit juice). Limit wine to 0-1 drink most nights.  Have protein with all meals and snacks (eggs, nuts, cheese, fish, meat, yogurt). [place] Protein bars for snacks.  Limit starches to a quarter of your plate (size of the palm of your hand). Aim to fill half of your plate with vegetables at lunch and dinner. Keep bagged salads and frozen vegetables around to add to meals.  Ask waiter to not put bread on table if you don't want it.  Use a small plate for meals. Cut meals in half if you go out to eat.  Continue exercising. Increase duration, intensity, or frequency.   Teaching Method Utilized:  Visual Auditory Hands on  Handouts given during visit include:  MyPlate Handout  15 g CHO Snack  Sheet  Barriers to learning/adherence to lifestyle change: none  Demonstrated degree of understanding via:  Teach Back   Monitoring/Evaluation:  Dietary intake, exercise, and body weight prn.

## 2014-04-24 NOTE — Telephone Encounter (Signed)
Please advise Dr Melvyn Novas. Thanks.

## 2014-04-24 NOTE — Assessment & Plan Note (Addendum)
ekg ok x for SB  Over treated with low bp and pulse > reduce tenormin from 50 mg to 25 mg daily   Needs to establish with primary care

## 2014-04-24 NOTE — Assessment & Plan Note (Signed)
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 Chronic right maxillary sinusitis changes with slight mucosal thickening which also involves the maxillary sinus ostium and infundibulum   - Consistently inconsistent with ICS documented 12/05/10  And 04/18/12  Again reviewed guidelines on self management/ f/u here prn flare

## 2014-04-26 ENCOUNTER — Ambulatory Visit (INDEPENDENT_AMBULATORY_CARE_PROVIDER_SITE_OTHER): Payer: BC Managed Care – PPO | Admitting: Cardiology

## 2014-04-26 ENCOUNTER — Encounter: Payer: Self-pay | Admitting: Cardiology

## 2014-04-26 VITALS — BP 118/80 | HR 68 | Ht 70.0 in | Wt 180.7 lb

## 2014-04-26 DIAGNOSIS — E785 Hyperlipidemia, unspecified: Secondary | ICD-10-CM

## 2014-04-26 DIAGNOSIS — Z131 Encounter for screening for diabetes mellitus: Secondary | ICD-10-CM

## 2014-04-26 NOTE — Patient Instructions (Signed)
Please have some blood work done.  For your Lipid Panel, which should be done in 2 months, you MUST be fasting.  Your physician wants you to follow-up in 3 months with Lyda Jester, PA-C. You will receive a reminder letter in the mail 2 months in advance. If you do not receive a letter, please call our office to schedule the follow-up appointment.

## 2014-04-26 NOTE — Progress Notes (Signed)
04/26/2014 Richard Roach   1955-09-04  [id number]  Primary Physician Christinia Gully, MD Primary Cardiologist: Dr. Stanford Breed  HPI:  The patient is a 58 year old male, followed by Dr. Stanford Breed. He has been seen in the past for evaluation of palpitations. He has been documented to have PVCs. An echocardiogram in December 2014 showed normal LV function with grade 2 diastolic dysfunction and mild left atrial enlargement. He also underwent an exercise treadmill test in December 2014 which was normal. TSH in December 2014 was also normal. He's been on beta blocker therapy with atenolol. He does not have an established PCP but has been seeing Dr. Christinia Gully, as he has a prior history of bronchitis. Dr. Melvyn Novas has been taking care of his medical needs and recently suggested that he established care with a new PCP. He was seen by Dr. Melvyn Novas 2 days ago on 04/24/2014. At that that time, he was noted to have bradycardia with a heart rate in the 40s. The patient also endorsed recent symptoms of fatigue, feeling tired with mild dyspnea as well as mild dizziness. However, he denied chest pain, syncope/near-syncope. Given his bradycardia and symptoms, Dr. Melvyn Novas decreased his atenolol from 50 mg daily down to 25 mg daily. Since the medication adjustment, the patient reports improvement in symptoms. He denies any further fatigue, dizziness and dyspnea. No increase in palpitations. His EKG in clinic today demonstrates normal sinus rhythm with a heart rate of 62 beats per minute. It should also be noted that Dr. Melvyn Novas checked a fasting lipid panel and LDL was found to be elevated at 131. The patient is not currently on any statin therapy. All other laboratory work including CBC, BMP and hepatic function were normal.    Current Outpatient Prescriptions  Medication Sig Dispense Refill  . Ascorbic Acid (VITAMIN C) 1000 MG tablet Take 1,000 mg by mouth daily.    Marland Kitchen aspirin 81 MG tablet Take 81 mg by mouth daily.    Marland Kitchen atenolol  (TENORMIN) 25 MG tablet One daily 30 tablet 11  . B Complex Vitamins (VITAMIN B-COMPLEX) TABS Take 1 tablet by mouth daily.    . Cholecalciferol (VITAMIN D PO) Take 1 tablet by mouth daily.    . Multiple Vitamin (MULTIVITAMIN) tablet Take 1 tablet by mouth daily.    . Omega-3 Fatty Acids (FISH OIL PO) Take 2 capsules by mouth daily.    Marland Kitchen VIAGRA 100 MG tablet TAKE 1 TABLET BY MOUTH DAILY 1 HOUR BEFORE NEEDED 6 tablet 3  . vitamin E 200 UNIT capsule Take 200 Units by mouth daily.     No current facility-administered medications for this visit.    No Known Allergies  History   Social History  . Marital Status: Married    Spouse Name: N/A    Number of Children: 2  . Years of Education: N/A   Occupational History  . Leisure centre manager    Social History Main Topics  . Smoking status: Former Smoker    Types: Cigarettes    Quit date: 05/11/1998  . Smokeless tobacco: Never Used     Comment: smoked only socially x 5 yrs  . Alcohol Use: 12.6 oz/week    21 Glasses of wine per week     Comment: Occassionally  . Drug Use: No  . Sexual Activity: Yes   Other Topics Concern  . Not on file   Social History Narrative   He lives with his wife, he is a Tree surgeon, travel, desk job,  no smoking.     Review of Systems: General: negative for chills, fever, night sweats or weight changes.  Cardiovascular: negative for chest pain, dyspnea on exertion, edema, orthopnea, palpitations, paroxysmal nocturnal dyspnea or shortness of breath Dermatological: negative for rash Respiratory: negative for cough or wheezing Urologic: negative for hematuria Abdominal: negative for nausea, vomiting, diarrhea, bright red blood per rectum, melena, or hematemesis Neurologic: negative for visual changes, syncope, or dizziness All other systems reviewed and are otherwise negative except as noted above.    Blood pressure 118/80, pulse 68, height 5\' 10"  (1.778 m), weight 180 lb 11.2 oz (81.965 kg).    General appearance: alert, cooperative and no distress Neck: no carotid bruit and no JVD Lungs: clear to auscultation bilaterally Heart: regular rate and rhythm, S1, S2 normal, no murmur, click, rub or gallop Extremities: no LEE Pulses: 2+ and symmetric Skin: warm and dry Neurologic: Grossly normal  EKG NSR 62 bpm  ASSESSMENT AND PLAN:   1. Sinus bradycardia: symptoms have resolved and heart rate has improved with reduction of atenolol down to 25 mg daily. His heart rate is stable at 62 beats per minute. Luckily, he has not had any increase in palpitations with the decrease in beta blocker therapy. His blood pressure is stable at 118/80. I recommended he continue 25 mg of atenolol daily.  2. Hyperlipidemia: recent fasting lipid panel revealed an elevated LDL of 131. He is not on any statin medications. We discussed options. He would first like to try lifestyle modifications with increase in physical activity and change in diet. I feel this is reasonable. We'll recheck a fasting lipid panel in 2 months for repeat evaluation. If no significant reduction in LDL with diet and exercise, we may need to consider statin therapy.  3. Primary prevention of CAD: as mentioned above, will attempt to lower cholesterol levels through lifestyle modifications. If no significant reduction in LDL will initiate statin therapy. His blood pressure is well-controlled. I recommend continuation of atenolol. He has never been screened for diabetes. Given his risk factors, I recommend checking a hemoglobin A1c. He denies tobacco use.  PLAN  Increase physical activity and adopt a heart healthy diet. Follow-up in 2 months with repeat lipid panel. Will check a hemoglobin A1c to screen for diabetes.   Kaeya Schiffer, BRITTAINYPA-C 04/26/2014 4:25 PM

## 2014-04-27 LAB — HEMOGLOBIN A1C
HEMOGLOBIN A1C: 5.6 % (ref <5.7)
MEAN PLASMA GLUCOSE: 114 mg/dL (ref <117)

## 2014-04-30 LAB — LIPID PANEL

## 2014-05-01 ENCOUNTER — Telehealth: Payer: Self-pay | Admitting: Cardiology

## 2014-05-01 ENCOUNTER — Encounter: Payer: Self-pay | Admitting: Cardiology

## 2014-05-01 NOTE — Telephone Encounter (Signed)
Pt had question about numbers on lab test, specifically HgB A1C. Discussed normal result findings. He will f/u with provider as scheduled. Pt voiced understanding, no other stated concerns at this time.

## 2014-05-01 NOTE — Telephone Encounter (Signed)
Wants to discuss his lab results from my-chart.

## 2014-05-02 ENCOUNTER — Encounter: Payer: Self-pay | Admitting: *Deleted

## 2014-05-09 ENCOUNTER — Ambulatory Visit (INDEPENDENT_AMBULATORY_CARE_PROVIDER_SITE_OTHER): Payer: BC Managed Care – PPO | Admitting: Gastroenterology

## 2014-05-09 ENCOUNTER — Encounter: Payer: Self-pay | Admitting: Gastroenterology

## 2014-05-09 ENCOUNTER — Encounter: Payer: Self-pay | Admitting: Internal Medicine

## 2014-05-09 VITALS — BP 120/72 | HR 56 | Ht 70.0 in | Wt 179.2 lb

## 2014-05-09 DIAGNOSIS — K760 Fatty (change of) liver, not elsewhere classified: Secondary | ICD-10-CM

## 2014-05-09 DIAGNOSIS — Z86010 Personal history of colonic polyps: Secondary | ICD-10-CM

## 2014-05-09 DIAGNOSIS — R160 Hepatomegaly, not elsewhere classified: Secondary | ICD-10-CM

## 2014-05-09 NOTE — Telephone Encounter (Signed)
MW please advise on the pts request about his BP meds.  See message below.  thanks  With my low pulse rate 2 weeks ago, You now have me on lower dose 25MG  Blood Pressure Med. however see my history below of BP readings....Marland Kitchenstill low pulse and my readings are better Now that I exercise 30 Min every day, and only one Glass of wine. You know with my travel schedule is high pressure.  Since Pulse low....see below should I be on BP med with out a Lilian Coma Blocker? As you know I have had some heart Palpitations, which I have seen Dr Stanford Breed. No problems lately. 12/16 114/74-Pulse 70 12/17 123/75/ pulse 65  12/18 118/72/Pulse 63 12/19 134/87 pulse /61 12/20 119/72/67 12/21 127/73/63 Below I did not take BP Med 12/22 125/79/68 12/23 118/74/61 12/24 112/68/66 12/25 120/74/71 12/26 122/78/65 12/27 124/80/64 12/28 12782/77 12/29/136/86/63...Marland KitchenMarland KitchenI took a BP peel after this reading 12/30 121/78/79.Marland KitchenMarland KitchenMarland KitchenDid not take BP peel today. I have 1st available apt with dr. Doug Sou down stairs, but not until March 1st. I could not get earlier appt.  Thanks  Irving

## 2014-05-09 NOTE — Patient Instructions (Signed)
[facility] will contact you with an appointment date and time.  Thank you for choosing me and [facility].  Pricilla Riffle. Dagoberto Ligas., MD., Marval Regal

## 2014-05-09 NOTE — Progress Notes (Signed)
History of Present Illness: This is a 58 year old male who returns for follow-up of fatty liver and mild hepatomegaly. He is accompanied by his wife. He and his wife have several questions. Imaging studies as below. LFTs, CBC, TSH have been normal. LDL cholesterol was mildly elevated with a normal total cholesterol. He has substantially decreased his wine consumption from about 5 glasses per day to about one glass every several days. He would like further evaluation to assess the severity of his fatty liver disease. He has no GI complaints.  Abd US IMPRESSION: Increased echotexture of the liver is consistent with fatty infiltrative change. No acute hepatobiliary abnormality is demonstrated. Bowel gas limited evaluation of the pancreas, abdominal aorta, and inferior vena cava.  Abd/pelvic CT IMPRESSION: Liver prominent, measuring 18.1 cm in length, with mild hepatic steatosis. No renal or ureteral calculus. No hydronephrosis. No abscess. No mesenteric inflammation. Appendix region appears normal. There is a small amount of fat in each inguinal ring but no inflammation in these regions.  No Known Allergies Outpatient Prescriptions Prior to Visit  Medication Sig Dispense Refill  . Ascorbic Acid (VITAMIN C) 1000 MG tablet Take 1,000 mg by mouth daily.    Marland Kitchen aspirin 81 MG tablet Take 81 mg by mouth daily.    Marland Kitchen atenolol (TENORMIN) 25 MG tablet One daily 30 tablet 11  . B Complex Vitamins (VITAMIN B-COMPLEX) TABS Take 1 tablet by mouth daily.    . Cholecalciferol (VITAMIN D PO) Take 1 tablet by mouth daily.    . Multiple Vitamin (MULTIVITAMIN) tablet Take 1 tablet by mouth daily.    . Omega-3 Fatty Acids (FISH OIL PO) Take 2 capsules by mouth daily.    Marland Kitchen VIAGRA 100 MG tablet TAKE 1 TABLET BY MOUTH DAILY 1 HOUR BEFORE NEEDED 6 tablet 3  . vitamin E 200 UNIT capsule Take 200 Units by mouth daily.     No facility-administered medications prior to visit.   Past Medical History  Diagnosis  Date  . Hypercholesterolemia   . Allergic rhinitis   . Hypertension   . Colon polyps   . Fatty liver   . Inguinal hernia   . Serrated adenoma of colon   . Internal hemorrhoids    Past Surgical History  Procedure Laterality Date  . Knee arthroscopy Right June 2011  . Tonsillectomy     History   Social History  . Marital Status: Married    Spouse Name: N/A    Number of Children: 2  . Years of Education: N/A   Occupational History  . Leisure centre manager    Social History Main Topics  . Smoking status: Former Smoker    Types: Cigarettes    Quit date: 05/11/1998  . Smokeless tobacco: Never Used     Comment: smoked only socially x 5 yrs  . Alcohol Use: 12.6 oz/week    21 Glasses of wine per week     Comment: Occassionally  . Drug Use: No  . Sexual Activity: Yes   Other Topics Concern  . 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   Family History  Problem Relation Age of Onset  . Asthma Father   . Colon cancer Neg Hx   . High blood pressure Mother   . Colon polyps Neg Hx   . Diabetes Neg Hx   . Kidney disease Neg Hx   . Esophageal cancer Neg  Hx     Physical Exam: General: Well developed , well nourished, no acute distress Head: Normocephalic and atraumatic Eyes:  sclerae anicteric, EOMI Ears: Normal auditory acuity Mouth: No deformity or lesions Lungs: Clear throughout to auscultation Heart: Regular rate and rhythm; no murmurs, rubs or bruits Abdomen: Soft, non tender and non distended. No masses, hepatosplenomegaly or hernias noted. Normal Bowel sounds Musculoskeletal: Symmetrical with no gross deformities  Pulses:  Normal pulses noted Extremities: No clubbing, cyanosis, edema or deformities noted Neurological: Alert oriented x 4, grossly nonfocal Psychological:  Alert and cooperative. Anxious.  Assessment and Recommendations:  1. Hepatic steatosis. Mild hepatomegaly. Standard  measures of maintaining a normal body weight, minimizing or avoiding alcohol consumption and monitoring/treating his cholesterol as indicated with his PCP were discussed. He is advised to follow recommendations provided from his nutrition consult 2 weeks ago. He is very interested in further assessing his fatty liver and mild hepatomegaly. We discussed the possibility of liver biopsy or Fibroscan. He also states he might be interested in clinical trials for monitoring and treatment of fatty liver disease. Referral to [facility].   2. Personal history of adenomatous colon polyps. Five-year interval surveillance colonoscopy is due May 2019.

## 2014-05-14 ENCOUNTER — Telehealth: Payer: Self-pay | Admitting: Internal Medicine

## 2014-05-14 MED ORDER — ATENOLOL 12.5 MG HALF TABLET: 12.5000 mg | ORAL_TABLET | Freq: Every day | ORAL | Status: DC

## 2014-05-14 NOTE — Telephone Encounter (Signed)
Per pt Email response from Dr. Melvyn Novas: Tenormin even in low doses sticks around for a long time so really not a good drug to use as needed.  I would recommend a dose of 12.5 mg daily and follow up with Dr Stanford Breed after about a month on this dose consistently - you can cut the pills or ask Magda Paganini to call the new dose in for you .  Pt is calling to get an rx sent in for 12.5. rx sent.  [place] Bing, CMA

## 2014-05-17 ENCOUNTER — Telehealth: Payer: Self-pay | Admitting: Internal Medicine

## 2014-05-17 MED ORDER — ATENOLOL 25 MG PO TABS: 12.5000 mg | ORAL_TABLET | Freq: Every day | ORAL | Status: DC

## 2014-05-17 NOTE — Telephone Encounter (Deleted)
Richard Roach, CMA at 05/14/2014 11:25 AM     Status: Signed       Expand All Collapse All   Per pt Email response from Dr. Melvyn Novas: Tenormin even in low doses sticks around for a long time so really not a good drug to use as needed.  I would recommend a dose of 12.5 mg daily and follow up with Dr Stanford Breed after about a month on this dose consistently - you can cut the pills or ask Magda Paganini to call the new dose in for you .  Pt is calling to get an rx sent in for 12.5. rx sent. [place] Bing, CMA

## 2014-05-17 NOTE — Telephone Encounter (Signed)
Richard Roach, CMA at 05/14/2014 11:25 AM     Status: Signed       Expand All Collapse All   Per pt Email response from Richard Roach: Tenormin even in low doses sticks around for a long time so really not a good drug to use as needed.  I would recommend a dose of 12.5 mg daily and follow up with Richard Roach after about a month on this dose consistently - you can cut the pills or ask Richard Roach to call the new dose in for you .  Pt is calling to get an rx sent in for 12.5. rx sent. Richard Roach, CMA       Looks as though Rx was printed-pharmacy did not get Rx. Pt is aware that I am resending Rx electronically now for him to pick up. Nothing more needed at this time.

## 2014-06-05 ENCOUNTER — Other Ambulatory Visit (HOSPITAL_COMMUNITY): Payer: Self-pay | Admitting: Nurse Practitioner

## 2014-06-05 DIAGNOSIS — B1920 Unspecified viral hepatitis C without hepatic coma: Secondary | ICD-10-CM

## 2014-06-07 ENCOUNTER — Encounter: Payer: Self-pay | Admitting: Gastroenterology

## 2014-07-05 ENCOUNTER — Other Ambulatory Visit (HOSPITAL_COMMUNITY): Payer: Self-pay

## 2014-07-10 ENCOUNTER — Ambulatory Visit (HOSPITAL_COMMUNITY)
Admission: RE | Admit: 2014-07-10 | Discharge: 2014-07-10 | Disposition: A | Discharge status: Home / Self Care | Payer: BLUE CROSS/BLUE SHIELD | Source: Ambulatory Visit | Attending: Nurse Practitioner | Admitting: Nurse Practitioner

## 2014-07-10 ENCOUNTER — Ambulatory Visit: Payer: BC Managed Care – PPO | Admitting: Internal Medicine

## 2014-07-10 DIAGNOSIS — K760 Fatty (change of) liver, not elsewhere classified: Secondary | ICD-10-CM | POA: Insufficient documentation

## 2014-07-10 DIAGNOSIS — B1920 Unspecified viral hepatitis C without hepatic coma: Secondary | ICD-10-CM | POA: Diagnosis not present

## 2014-07-10 DIAGNOSIS — I10 Essential (primary) hypertension: Secondary | ICD-10-CM | POA: Insufficient documentation

## 2014-07-11 ENCOUNTER — Other Ambulatory Visit (INDEPENDENT_AMBULATORY_CARE_PROVIDER_SITE_OTHER): Payer: Self-pay | Admitting: General Surgery

## 2014-07-11 ENCOUNTER — Other Ambulatory Visit (INDEPENDENT_AMBULATORY_CARE_PROVIDER_SITE_OTHER): Payer: Self-pay | Admitting: *Deleted

## 2014-07-11 DIAGNOSIS — R1031 Right lower quadrant pain: Secondary | ICD-10-CM

## 2014-07-12 ENCOUNTER — Ambulatory Visit
Admission: RE | Admit: 2014-07-12 | Discharge: 2014-07-12 | Disposition: A | Discharge status: Home / Self Care | Payer: BLUE CROSS/BLUE SHIELD | Source: Ambulatory Visit | Attending: General Surgery | Admitting: General Surgery

## 2014-07-12 MED ORDER — IOPAMIDOL (ISOVUE-300) INJECTION 61%
100.0000 mL | Freq: Once | INTRAVENOUS | Status: AC | PRN
  Administered 2014-07-12: 100 mL via INTRAVENOUS

## 2014-07-13 ENCOUNTER — Telehealth: Payer: Self-pay | Admitting: Cardiology

## 2014-07-13 NOTE — Telephone Encounter (Signed)
Reviewed orders, cannot find order for this date. Pt at [facility] lab this morning and they cannot find order on-hand either. He had his blood drawn 2 days ago there, and orders were in then.

## 2014-07-13 NOTE — Telephone Encounter (Addendum)
Pt advised that we are still trying to hear back from [facility] regarding results of lab draw from the other day.

## 2014-07-13 NOTE — Telephone Encounter (Signed)
Pt is calling to get the results to his cholesterol labs. He stated that he is currently in a doctors office who wants to view them

## 2014-07-13 NOTE — Telephone Encounter (Signed)
Called [facility] lab & left message to call back.

## 2014-07-13 NOTE — Telephone Encounter (Signed)
Pt would like lab results from 07-10-14. He wants to come by asap to get a copy of this,this morning.

## 2014-07-14 ENCOUNTER — Emergency Department (HOSPITAL_COMMUNITY)
Admission: EM | Admit: 2014-07-14 | Discharge: 2014-07-14 | Disposition: A | Discharge status: Home / Self Care | Payer: BLUE CROSS/BLUE SHIELD | Attending: Emergency Medicine | Admitting: Emergency Medicine

## 2014-07-14 ENCOUNTER — Encounter (HOSPITAL_COMMUNITY): Payer: Self-pay | Admitting: Emergency Medicine

## 2014-07-14 ENCOUNTER — Emergency Department (HOSPITAL_COMMUNITY): Payer: BLUE CROSS/BLUE SHIELD

## 2014-07-14 DIAGNOSIS — N451 Epididymitis: Secondary | ICD-10-CM

## 2014-07-14 DIAGNOSIS — Z8709 Personal history of other diseases of the respiratory system: Secondary | ICD-10-CM | POA: Insufficient documentation

## 2014-07-14 DIAGNOSIS — F419 Anxiety disorder, unspecified: Secondary | ICD-10-CM | POA: Diagnosis not present

## 2014-07-14 DIAGNOSIS — Z86010 Personal history of colonic polyps: Secondary | ICD-10-CM | POA: Diagnosis not present

## 2014-07-14 DIAGNOSIS — N50811 Right testicular pain: Secondary | ICD-10-CM

## 2014-07-14 DIAGNOSIS — K5900 Constipation, unspecified: Secondary | ICD-10-CM | POA: Insufficient documentation

## 2014-07-14 DIAGNOSIS — Z79899 Other long term (current) drug therapy: Secondary | ICD-10-CM | POA: Diagnosis not present

## 2014-07-14 DIAGNOSIS — E780 Pure hypercholesterolemia: Secondary | ICD-10-CM | POA: Insufficient documentation

## 2014-07-14 DIAGNOSIS — Z87891 Personal history of nicotine dependence: Secondary | ICD-10-CM | POA: Diagnosis not present

## 2014-07-14 DIAGNOSIS — N508 Other specified disorders of male genital organs: Secondary | ICD-10-CM | POA: Insufficient documentation

## 2014-07-14 DIAGNOSIS — R109 Unspecified abdominal pain: Secondary | ICD-10-CM | POA: Diagnosis present

## 2014-07-14 DIAGNOSIS — I10 Essential (primary) hypertension: Secondary | ICD-10-CM | POA: Insufficient documentation

## 2014-07-14 DIAGNOSIS — Z7982 Long term (current) use of aspirin: Secondary | ICD-10-CM | POA: Diagnosis not present

## 2014-07-14 LAB — COMPREHENSIVE METABOLIC PANEL
ALK PHOS: 62 U/L (ref 39–117)
ALT: 33 U/L (ref 0–53)
AST: 28 U/L (ref 0–37)
Albumin: 4.3 g/dL (ref 3.5–5.2)
Anion gap: 8 (ref 5–15)
BUN: 19 mg/dL (ref 6–23)
CHLORIDE: 104 mmol/L (ref 96–112)
CO2: 27 mmol/L (ref 19–32)
Calcium: 9.6 mg/dL (ref 8.4–10.5)
Creatinine, Ser: 1.13 mg/dL (ref 0.50–1.35)
GFR calc Af Amer: 82 mL/min — ABNORMAL LOW (ref >90)
GFR calc non Af Amer: 70 mL/min — ABNORMAL LOW (ref >90)
Glucose, Bld: 94 mg/dL (ref 70–99)
Potassium: 3.7 mmol/L (ref 3.5–5.1)
SODIUM: 139 mmol/L (ref 135–145)
TOTAL PROTEIN: 7.5 g/dL (ref 6.0–8.3)
Total Bilirubin: 0.9 mg/dL (ref 0.3–1.2)

## 2014-07-14 LAB — URINALYSIS, ROUTINE W REFLEX MICROSCOPIC
Bilirubin Urine: NEGATIVE
GLUCOSE, UA: NEGATIVE mg/dL
Hgb urine dipstick: NEGATIVE
Ketones, ur: NEGATIVE mg/dL
Leukocytes, UA: NEGATIVE
NITRITE: NEGATIVE
Protein, ur: NEGATIVE mg/dL
SPECIFIC GRAVITY, URINE: 1.018 (ref 1.005–1.030)
UROBILINOGEN UA: 0.2 mg/dL (ref 0.0–1.0)
pH: 6.5 (ref 5.0–8.0)

## 2014-07-14 LAB — RAPID URINE DRUG SCREEN, HOSP PERFORMED
AMPHETAMINES: NOT DETECTED
BARBITURATES: NOT DETECTED
BENZODIAZEPINES: NOT DETECTED
COCAINE: NOT DETECTED
Opiates: NOT DETECTED
TETRAHYDROCANNABINOL: NOT DETECTED

## 2014-07-14 LAB — CBC WITH DIFFERENTIAL/PLATELET
BASOS ABS: 0 10*3/uL (ref 0.0–0.1)
Basophils Relative: 0 % (ref 0–1)
EOS PCT: 2 % (ref 0–5)
Eosinophils Absolute: 0.1 10*3/uL (ref 0.0–0.7)
HCT: 47.5 % (ref 39.0–52.0)
HEMOGLOBIN: 16.5 g/dL (ref 13.0–17.0)
LYMPHS ABS: 1.7 10*3/uL (ref 0.7–4.0)
Lymphocytes Relative: 25 % (ref 12–46)
MCH: 30.9 pg (ref 26.0–34.0)
MCHC: 34.7 g/dL (ref 30.0–36.0)
MCV: 89 fL (ref 78.0–100.0)
MONOS PCT: 10 % (ref 3–12)
Monocytes Absolute: 0.7 10*3/uL (ref 0.1–1.0)
NEUTROS PCT: 63 % (ref 43–77)
Neutro Abs: 4.2 10*3/uL (ref 1.7–7.7)
PLATELETS: 217 10*3/uL (ref 150–400)
RBC: 5.34 MIL/uL (ref 4.22–5.81)
RDW: 12.8 % (ref 11.5–15.5)
WBC: 6.7 10*3/uL (ref 4.0–10.5)

## 2014-07-14 LAB — LIPASE, BLOOD: Lipase: 21 U/L (ref 11–59)

## 2014-07-14 MED ORDER — DOXYCYCLINE HYCLATE 100 MG PO CAPS: ORAL_CAPSULE | ORAL | Status: DC

## 2014-07-14 MED ORDER — POLYETHYLENE GLYCOL 3350 17 G PO PACK: 17.0000 g | PACK | Freq: Every day | ORAL | Status: DC

## 2014-07-14 MED ORDER — SODIUM CHLORIDE 0.9 % IV BOLUS (SEPSIS)
1000.0000 mL | Freq: Once | INTRAVENOUS | Status: AC
  Administered 2014-07-14: 1000 mL via INTRAVENOUS

## 2014-07-14 MED ORDER — CEFTRIAXONE SODIUM 250 MG IJ SOLR
250.0000 mg | Freq: Once | INTRAMUSCULAR | Status: AC
  Administered 2014-07-14: 250 mg via INTRAMUSCULAR
  Filled 2014-07-14: qty 250

## 2014-07-14 MED ORDER — IOHEXOL 300 MG/ML  SOLN
50.0000 mL | Freq: Once | INTRAMUSCULAR | Status: AC | PRN
  Administered 2014-07-14: 50 mL via INTRAVENOUS

## 2014-07-14 MED ORDER — CIPROFLOXACIN HCL 500 MG PO TABS: 500.0000 mg | ORAL_TABLET | Freq: Two times a day (BID) | ORAL | Status: DC

## 2014-07-14 MED ORDER — AZITHROMYCIN 250 MG PO TABS
1000.0000 mg | ORAL_TABLET | Freq: Once | ORAL | Status: AC
  Administered 2014-07-14: 1000 mg via ORAL
  Filled 2014-07-14: qty 4

## 2014-07-14 MED ORDER — LIDOCAINE HCL 1 % IJ SOLN
INTRAMUSCULAR | Status: AC
  Administered 2014-07-14: 0.9 mL
  Filled 2014-07-14: qty 20

## 2014-07-14 MED ORDER — IOHEXOL 300 MG/ML  SOLN
100.0000 mL | Freq: Once | INTRAMUSCULAR | Status: AC | PRN
  Administered 2014-07-14: 100 mL via INTRAVENOUS

## 2014-07-14 NOTE — ED Notes (Signed)
PA at bedside.

## 2014-07-14 NOTE — Discharge Instructions (Signed)
Please call your doctor for a followup appointment within 24-48 hours. When you talk to your doctor please let them know that you were seen in the emergency department and have them acquire all of your records so that they can discuss the findings with you and formulate a treatment plan to fully care for your new and ongoing problems. Please follow up with your primary care provider Please rest and stay hydrated Please take medications as prescribed and on a full stomach  Please increase fluid intake and fiber for loosening of stools Please continue to monitor symptoms closely and if symptoms are to worsen or change (fever greater than 101, chills, sweating, nausea, vomiting, chest pain, shortness of breathe, difficulty breathing, weakness, numbness, tingling, worsening or changes to pain pattern, inability to control urine or bowel movements, inability to keep food or fluids down, blood in the stools, black tarry stools) please report back to the Emergency Department immediately.    Constipation Constipation is when a person:  Poops (has a bowel movement) less than 3 times a week.  Has a hard time pooping.  Has poop that is dry, hard, or bigger than normal. HOME CARE   Eat foods with a lot of fiber in them. This includes fruits, vegetables, beans, and whole grains such as brown rice.  Avoid fatty foods and foods with a lot of sugar. This includes french fries, hamburgers, cookies, candy, and soda.  If you are not getting enough fiber from food, take products with added fiber in them (supplements).  Drink enough fluid to keep your pee (urine) clear or pale yellow.  Exercise on a regular basis, or as told by your doctor.  Go to the restroom when you feel like you need to poop. Do not hold it.  Only take medicine as told by your doctor. Do not take medicines that help you poop (laxatives) without talking to your doctor first. GET HELP RIGHT AWAY IF:   You have bright red blood in your  poop (stool).  Your constipation lasts more than 4 days or gets worse.  You have belly (abdominal) or butt (rectal) pain.  You have thin poop (as thin as a pencil).  You lose weight, and it cannot be explained. MAKE SURE YOU:   Understand these instructions.  Will watch your condition.  Will get help right away if you are not doing well or get worse. Document Released: 10/14/2007 Document Revised: 05/02/2013 Document Reviewed: 02/06/2013 [facility] Patient Information 2015 [place], [place]. This information is not intended to replace advice given to you by your health care provider. Make sure you discuss any questions you have with your health care provider.  Epididymitis Epididymitis is a swelling (inflammation) of the epididymis. The epididymis is a cord-like structure along the back part of the testicle. Epididymitis is usually, but not always, caused by infection. This is usually a sudden problem beginning with chills, fever and pain behind the scrotum and in the testicle. There may be swelling and redness of the testicle. DIAGNOSIS  Physical examination will reveal a tender, swollen epididymis. Sometimes, cultures are obtained from the urine or from prostate secretions to help find out if there is an infection or if the cause is a different problem. Sometimes, blood work is performed to see if your white blood cell count is elevated and if a germ (bacterial) or viral infection is present. Using this knowledge, an appropriate medicine which kills germs (antibiotic) can be chosen by your caregiver. A viral infection causing epididymitis  will most often go away (resolve) without treatment. HOME CARE INSTRUCTIONS   Hot sitz baths for 20 minutes, 4 times per day, may help relieve pain.  Only take over-the-counter or prescription medicines for pain, discomfort or fever as directed by your caregiver.  Take all medicines, including antibiotics, as directed. Take the antibiotics for the full  prescribed length of time even if you are feeling better.  It is very important to keep all follow-up appointments. SEEK IMMEDIATE MEDICAL CARE IF:   You have a fever.  You have pain not relieved with medicines.  You have any worsening of your problems.  Your pain seems to come and go.  You develop pain, redness, and swelling in the scrotum and surrounding areas. MAKE SURE YOU:   Understand these instructions.  Will watch your condition.  Will get help right away if you are not doing well or get worse. Document Released: 04/24/2000 Document Revised: 07/20/2011 Document Reviewed: 03/14/2009 [facility] Patient Information 2015 [place], [place]. This information is not intended to replace advice given to you by your health care provider. Make sure you discuss any questions you have with your health care provider.

## 2014-07-14 NOTE — ED Notes (Signed)
Pt reports R side abd pain and R flank pain for the past week, worse since last night. Hx of liver scarring. Denies dysuria or blood in his urine. No n/v/d

## 2014-07-14 NOTE — ED Notes (Signed)
Bed: ZT86 Expected date:  Expected time:  Means of arrival:  Comments: HOLD FOR TRIAGE

## 2014-07-14 NOTE — ED Provider Notes (Signed)
CSN: [id number]     Arrival date & time 07/14/14  1333 History   First MD Initiated Contact with Patient 07/14/14 1558     Chief Complaint  Patient presents with  . Abdominal Pain  . Flank Pain     (Consider location/radiation/quality/duration/timing/severity/associated sxs/prior Treatment) The history is provided by the patient. No language interpreter was used.  Richard Roach is a 59 y/o M with PMHx of HCL, allergic rhinitis, HTN, colonic polyps, internal hemorrhoids, alcohol abuse in remission for 2 months presenting to the ED with right sided abdominal pain that has been ongoing for a week and a half with worsening of the symptoms for the past couple of days. Patient reported that the pain is localized to the RUQ and RLQ. Patient reported that the pain is a constant aching, dull, bloating pain. Reported that the pain is a sharp pain with motion. Stated that there is mild radiation to the right flank, but mainly to the anterior aspect of the right abdomen. Patient reported that he has used nothing for his pain. Patient reported that he called his physician yesterday who ordered CT pelvis with contrast yesterday - patient reported that the CT was performed - negative findings for hernia. Patient reported that he does a lot of travel and stated that he needs to out of here by 7:30 PM. Denied fever, chills, chest pain, shortness of breath, difficulty breathing, nausea, vomiting, diarrhea, melena, hematochezia, dysuria, hematuria, penile testicular pain/swelling. PCP Doug Sou  Past Medical History  Diagnosis Date  . Hypercholesterolemia   . Allergic rhinitis   . Hypertension   . Colon polyps   . Fatty liver   . Inguinal hernia   . Serrated adenoma of colon   . Internal hemorrhoids    Past Surgical History  Procedure Laterality Date  . Knee arthroscopy Right June 2011  . Tonsillectomy     Family History  Problem Relation Age of Onset  . Asthma Father   . Colon cancer Neg Hx   . High blood  pressure Mother   . Colon polyps Neg Hx   . Diabetes Neg Hx   . Kidney disease Neg Hx   . Esophageal cancer Neg Hx    History  Substance Use Topics  . Smoking status: Former Smoker    Types: Cigarettes    Quit date: 05/11/1998  . Smokeless tobacco: Never Used     Comment: smoked only socially x 5 yrs  . Alcohol Use: 12.6 oz/week    21 Glasses of wine per week     Comment: Occassionally    Review of Systems  Constitutional: Negative for fever and chills.  Respiratory: Negative for chest tightness and shortness of breath.   Cardiovascular: Negative for chest pain.  Gastrointestinal: Positive for abdominal pain. Negative for nausea, vomiting, diarrhea, constipation, blood in stool and anal bleeding.  Genitourinary: Negative for dysuria, hematuria, flank pain, decreased urine volume, penile swelling, scrotal swelling, penile pain and testicular pain.  Musculoskeletal: Negative for back pain, neck pain and neck stiffness.      Allergies  Review of patient's allergies indicates no known allergies.  Home Medications   Prior to Admission medications   Medication Sig Start Date End Date Taking? Authorizing Provider  Ascorbic Acid (VITAMIN C) 1000 MG tablet Take 1,000 mg by mouth daily.   Yes Historical Provider, MD  atenolol (TENORMIN) 25 MG tablet Take 0.5 tablets (12.5 mg total) by mouth daily. 05/17/14  Yes Tanda Rockers, MD  B Complex Vitamins (  VITAMIN B-COMPLEX) TABS Take 1 tablet by mouth daily.   Yes Historical Provider, MD  Cholecalciferol (VITAMIN D PO) Take 1 tablet by mouth daily.   Yes Historical Provider, MD  Multiple Vitamin (MULTIVITAMIN) tablet Take 1 tablet by mouth daily.   Yes Historical Provider, MD  Omega-3 Fatty Acids (FISH OIL PO) Take 2 capsules by mouth daily.   Yes Historical Provider, MD  Tetrahydrozoline HCl (VISINE EXTRA OP) Place 1-2 drops into both eyes daily as needed (dry eyes).   Yes Historical Provider, MD  VIAGRA 100 MG tablet TAKE 1 TABLET BY  MOUTH DAILY 1 HOUR BEFORE NEEDED   Yes Tanda Rockers, MD  vitamin E 200 UNIT capsule Take 200 Units by mouth daily.   Yes Historical Provider, MD  aspirin 81 MG tablet Take 81 mg by mouth daily.    Historical Provider, MD  ciprofloxacin (CIPRO) 500 MG tablet Take 1 tablet (500 mg total) by mouth 2 (two) times daily. 07/14/14   Treshun Wold, PA-C  polyethylene glycol (MIRALAX / GLYCOLAX) packet Take 17 g by mouth daily. 07/14/14   Ac Colan, PA-C   BP 144/90 mmHg  Pulse 78  Temp(Src) 98.8 F (37.1 C) (Oral)  Resp 19  Ht 5\' 10"  (1.778 m)  Wt 178 lb (80.74 kg)  BMI 25.54 kg/m2  SpO2 99% Physical Exam  Constitutional: He is oriented to person, place, and time. He appears well-developed and well-nourished. No distress.  HENT:  Head: Normocephalic and atraumatic.  Eyes: Conjunctivae and EOM are normal. Right eye exhibits no discharge. Left eye exhibits no discharge.  Neck: Normal range of motion. Neck supple.  Cardiovascular: Normal rate, regular rhythm and normal heart sounds.  Exam reveals no friction rub.   No murmur heard. Pulses:      Radial pulses are 2+ on the right side, and 2+ on the left side.  Pulmonary/Chest: Effort normal and breath sounds normal. No respiratory distress. He has no wheezes. He has no rales.  Abdominal: Soft. Bowel sounds are normal. He exhibits no distension. There is tenderness in the right upper quadrant and right lower quadrant. There is no rebound, no guarding, no CVA tenderness, no tenderness at McBurney's point and negative Murphy's sign.    Genitourinary: Penis normal. No penile tenderness.  Pelvic Exam: Circumcised. Negative swelling, erythema, inflammation, lesions, sores, deformities noted to the penis or testicles. Negative high riding testicle. Negative hydrocele or varicocele. Mild discomfort upon palpation to the right testicle. Negative feelings of hernia.  Exam chaperoned with nurse, Izora Gala.  Musculoskeletal: Normal range of motion.   Neurological: He is alert and oriented to person, place, and time.  Skin: Skin is warm and dry. No rash noted. He is not diaphoretic. No erythema.  Psychiatric: His behavior is normal. Thought content normal. His mood appears anxious.  Nursing note and vitals reviewed.   ED Course  Procedures (including critical care time)  Results for orders placed or performed during the hospital encounter of 07/14/14  Comprehensive metabolic panel  Result Value Ref Range   Sodium 139 135 - 145 mmol/L   Potassium 3.7 3.5 - 5.1 mmol/L   Chloride 104 96 - 112 mmol/L   CO2 27 19 - 32 mmol/L   Glucose, Bld 94 70 - 99 mg/dL   BUN 19 6 - 23 mg/dL   Creatinine, Ser 1.13 0.50 - 1.35 mg/dL   Calcium 9.6 8.4 - 10.5 mg/dL   Total Protein 7.5 6.0 - 8.3 g/dL   Albumin 4.3 3.5 -  5.2 g/dL   AST 28 0 - 37 U/L   ALT 33 0 - 53 U/L   Alkaline Phosphatase 62 39 - 117 U/L   Total Bilirubin 0.9 0.3 - 1.2 mg/dL   GFR calc non Af Amer 70 (L) >90 mL/min   GFR calc Af Amer 82 (L) >90 mL/min   Anion gap 8 5 - 15  CBC with Differential  Result Value Ref Range   WBC 6.7 4.0 - 10.5 K/uL   RBC 5.34 4.22 - 5.81 MIL/uL   Hemoglobin 16.5 13.0 - 17.0 g/dL   HCT 47.5 39.0 - 52.0 %   MCV 89.0 78.0 - 100.0 fL   MCH 30.9 26.0 - 34.0 pg   MCHC 34.7 30.0 - 36.0 g/dL   RDW 12.8 11.5 - 15.5 %   Platelets 217 150 - 400 K/uL   Neutrophils Relative % 63 43 - 77 %   Neutro Abs 4.2 1.7 - 7.7 K/uL   Lymphocytes Relative 25 12 - 46 %   Lymphs Abs 1.7 0.7 - 4.0 K/uL   Monocytes Relative 10 3 - 12 %   Monocytes Absolute 0.7 0.1 - 1.0 K/uL   Eosinophils Relative 2 0 - 5 %   Eosinophils Absolute 0.1 0.0 - 0.7 K/uL   Basophils Relative 0 0 - 1 %   Basophils Absolute 0.0 0.0 - 0.1 K/uL  Lipase, blood  Result Value Ref Range   Lipase 21 11 - 59 U/L  Urinalysis, Routine w reflex microscopic  Result Value Ref Range   Color, Urine YELLOW YELLOW   APPearance CLEAR CLEAR   Specific Gravity, Urine 1.018 1.005 - 1.030   pH 6.5 5.0 -  8.0   Glucose, UA NEGATIVE NEGATIVE mg/dL   Hgb urine dipstick NEGATIVE NEGATIVE   Bilirubin Urine NEGATIVE NEGATIVE   Ketones, ur NEGATIVE NEGATIVE mg/dL   Protein, ur NEGATIVE NEGATIVE mg/dL   Urobilinogen, UA 0.2 0.0 - 1.0 mg/dL   Nitrite NEGATIVE NEGATIVE   Leukocytes, UA NEGATIVE NEGATIVE  Urine rapid drug screen (hosp performed)  Result Value Ref Range   Opiates NONE DETECTED NONE DETECTED   Cocaine NONE DETECTED NONE DETECTED   Benzodiazepines NONE DETECTED NONE DETECTED   Amphetamines NONE DETECTED NONE DETECTED   Tetrahydrocannabinol NONE DETECTED NONE DETECTED   Barbiturates NONE DETECTED NONE DETECTED    Labs Review Labs Reviewed  COMPREHENSIVE METABOLIC PANEL - Abnormal; Notable for the following:    GFR calc non Af Amer 70 (*)    GFR calc Af Amer 82 (*)    All other components within normal limits  CBC WITH DIFFERENTIAL/PLATELET  LIPASE, BLOOD  URINALYSIS, ROUTINE W REFLEX MICROSCOPIC  URINE RAPID DRUG SCREEN (HOSP PERFORMED)    Imaging Review US Scrotum  07/14/2014   CLINICAL DATA:  Right testicle pain right groin pain subsequent evaluation pain for 1 week worse since last night  EXAM: SCROTAL ULTRASOUND  DOPPLER ULTRASOUND OF THE TESTICLES  TECHNIQUE: Complete ultrasound examination of the testicles, epididymis, and other scrotal structures was performed. Color and spectral Doppler ultrasound were also utilized to evaluate blood flow to the testicles.  COMPARISON:  07/12/2014  FINDINGS: Right testicle  Measurements: 40 x 23 x 33 mm. No mass or microlithiasis visualized.  Left testicle  Measurements: 35 x 24 x 29 mm. No mass or microlithiasis visualized.  Right epididymis: 7 mm cyst right epididymis. Right epididymis mildly hypervascular as compared to the left.  Left epididymis:  Normal in size and appearance.  Hydrocele:  Small bilateral hydroceles  Varicocele:  None visualized.  Pulsed Doppler interrogation of both testes demonstrates normal low resistance arterial  and venous waveforms bilaterally.  IMPRESSION: No definite abnormalities. Mild asymmetry in the degree of vascularity between the right and left epididymis, greater on the right. The possibility of right epididymitis could be considered although the epididymis is not enlarged.   Electronically Signed   By: Skipper Cliche M.D.   On: 07/14/2014 18:24   Ct Abdomen Pelvis W Contrast  07/14/2014   CLINICAL DATA:  Right-sided abdominal and right flank pain for the past week. Fatty liver.  EXAM: CT ABDOMEN AND PELVIS WITH CONTRAST  TECHNIQUE: Multidetector CT imaging of the abdomen and pelvis was performed using the standard protocol following bolus administration of intravenous contrast.  CONTRAST:  67mL OMNIPAQUE IOHEXOL 300 MG/ML SOLN, 119mL OMNIPAQUE IOHEXOL 300 MG/ML SOLN  COMPARISON:  Pelvic CT of 07/12/2014. Prior abdominal pelvic CT of 02/19/2014. Abdominal ultrasound of 03/16/2014.  FINDINGS: Lower chest: Clear lung bases. Normal heart size without pericardial or pleural effusion. Right Coronary artery atherosclerosis.  Hepatobiliary: Normal liver. Normal gallbladder, without biliary ductal dilatation.  Pancreas: Normal, without mass or ductal dilatation.  Spleen: Normal  Adrenals/Urinary Tract: Normal adrenal glands. Normal appearance of the kidneys. The left renal collecting system is at least partially duplicated. Ureteric components are followed to the level of the midpelvis. No hydronephrosis. Normal urinary bladder.  Stomach/Bowel: Normal stomach, without wall thickening. Colonic stool burden suggests constipation. Normal terminal ileum. Appendix likely identified on coronal image 56, normal. Normal small bowel.  Vascular/Lymphatic: Mild aortic atherosclerosis. No aneurysm. No abdominopelvic adenopathy.  Reproductive: Normal prostate.  Other: No significant free fluid.  Musculoskeletal: No acute osseous abnormality.  IMPRESSION: 1.  No acute process in the abdomen or pelvis. 2.  Possible constipation. 3.  No evidence of appendicitis.  Appendix likely identified. 4.  Atherosclerosis, including within the coronary arteries.   Electronically Signed   By: Abigail Miyamoto M.D.   On: 07/14/2014 19:46   [place] Art/ven Flow Abd Pelv Doppler  07/14/2014   CLINICAL DATA:  Right testicle pain right groin pain subsequent evaluation pain for 1 week worse since last night  EXAM: SCROTAL ULTRASOUND  DOPPLER ULTRASOUND OF THE TESTICLES  TECHNIQUE: Complete ultrasound examination of the testicles, epididymis, and other scrotal structures was performed. Color and spectral Doppler ultrasound were also utilized to evaluate blood flow to the testicles.  COMPARISON:  07/12/2014  FINDINGS: Right testicle  Measurements: 40 x 23 x 33 mm. No mass or microlithiasis visualized.  Left testicle  Measurements: 35 x 24 x 29 mm. No mass or microlithiasis visualized.  Right epididymis: 7 mm cyst right epididymis. Right epididymis mildly hypervascular as compared to the left.  Left epididymis:  Normal in size and appearance.  Hydrocele:  Small bilateral hydroceles  Varicocele:  None visualized.  Pulsed Doppler interrogation of both testes demonstrates normal low resistance arterial and venous waveforms bilaterally.  IMPRESSION: No definite abnormalities. Mild asymmetry in the degree of vascularity between the right and left epididymis, greater on the right. The possibility of right epididymitis could be considered although the epididymis is not enlarged.   Electronically Signed   By: Skipper Cliche M.D.   On: 07/14/2014 18:24     EKG Interpretation None      8:34 PM Discussed that doxycycline is broken down by the liver. Dr. Crosby Oyster recommended Cipro 500 mg BID.   MDM   Final diagnoses:  Pain in right testicle  Right epididymitis  Constipation, unspecified constipation type    Medications  sodium chloride 0.9 % bolus 1,000 mL (0 mLs Intravenous Stopped 07/14/14 2012)  iohexol (OMNIPAQUE) 300 MG/ML solution 50 mL (50 mLs Intravenous  Contrast Given 07/14/14 1745)  iohexol (OMNIPAQUE) 300 MG/ML solution 100 mL (100 mLs Intravenous Contrast Given 07/14/14 1826)  cefTRIAXone (ROCEPHIN) injection 250 mg (250 mg Intramuscular Given 07/14/14 2027)  lidocaine (XYLOCAINE) 1 % (with pres) injection (0.9 mLs  Given 07/14/14 2028)  azithromycin (ZITHROMAX) tablet 1,000 mg (1,000 mg Oral Given 07/14/14 2039)    Filed Vitals:   07/14/14 1355 07/14/14 2028  BP: 155/83 144/90  Pulse: 72 78  Temp: 98.2 F (36.8 C) 98.8 F (37.1 C)  TempSrc: Oral Oral  Resp: 18 19  Height: 5\' 10"  (1.778 m)   Weight: 178 lb (80.74 kg)   SpO2: 99% 99%   This provider reviewed the patient's chart. Patient had a CT pelvis with contrast performed yesterday, 07/13/2014 regarding possible hernia-no abnormality identified-degenerative changes of the right hip noted. CBC negative elevated leukocytosis. Hemoglobin 16.5, hematocrit 47.5. CMP unremarkable-negative elevated liver enzymes. BUN/creatinine within normal limits. Lipase negative elevation. Urinalysis negative for infection-negative nitrites, leukocytes, hemoglobin. Urine drug screen negative. CT abdomen and pelvis with contrast noted to process in the abdomen or pelvis. Possible constipation. No evidence of appendicitis. Ultrasounds no acute deformities identified to the testicle-mild asymmetry in the degree of vascularity between the right and left epididymis. Possibility of right epididymitis could be considered although the epididymis is not enlarged. Tenderness upon palpation to the right testing-will cover patient for possible right epididymitis. Constipation identified on CT-Miralax be administered. Negative acute abnormalities noted on ultrasound or CT. Negative findings of diverticulitis. Negative findings of appendicitis. Negative findings of testicular torsion. Patient stable, afebrile. Patient not septic appearing. Discharged patient. Referred patient to primary care provider. Discussed with patient to rest  and stay hydrated. Discussed with patient high fiber diet and to stay hydrated for loosening of stools. Discussed with patient to closely monitor symptoms and if symptoms are to worsen or change to report back to the ED - strict return instructions given.  Patient agreed to plan of care, understood, all questions answered.   Jamse Mead, PA-C 07/14/14 2028  Jamse Mead, PA-C 07/15/14 1610  Quintella Reichert, MD 07/15/14 [phone]

## 2014-07-16 ENCOUNTER — Encounter: Payer: Self-pay | Admitting: Cardiology

## 2014-07-16 NOTE — Telephone Encounter (Signed)
Pt is calling to get the results of his lab work. Please call

## 2014-07-16 NOTE — Telephone Encounter (Signed)
Called & spoke to patient, he was extremely appreciative of me getting cholesterol numbers if I could. I explained that [facility] had assured me a fax would be sent this morning, however I had not gotten it.  I stated I would call back and see if I could get the cholesterol number OTP for him. Will send through Mychart per his earlier request.

## 2014-07-16 NOTE — Telephone Encounter (Signed)
Contacted pt to let him know updates pending.  [employer], verified fax number, they stated they are sending results to [place] this AM.

## 2014-07-17 ENCOUNTER — Encounter: Payer: Self-pay | Admitting: Gastroenterology

## 2014-07-17 ENCOUNTER — Encounter: Payer: Self-pay | Admitting: Cardiology

## 2014-07-29 ENCOUNTER — Encounter: Payer: Self-pay | Admitting: Cardiology

## 2014-07-30 ENCOUNTER — Telehealth: Payer: Self-pay | Admitting: *Deleted

## 2014-07-30 NOTE — Telephone Encounter (Signed)
Forwarded to [employer]

## 2014-07-30 NOTE — Telephone Encounter (Signed)
Hi Brittainy,         I am exercising 30Min day & really watch diet.Marland Kitchen..Marland Kitchenwhen can we schedule another Chol blood test....my last results    Trig 88    Total 229    HDL 62    LDL 149 High    Ideally what Numbers would you like to see for me? at 59 Years old male.    I want to try to stay off meds if possible?    Thanks,        Keeon Zurn    [phone] cell    Date birth 05-02-1956

## 2014-08-06 ENCOUNTER — Telehealth: Payer: Self-pay | Admitting: Gastroenterology

## 2014-08-06 NOTE — Telephone Encounter (Signed)
Patient states he had a CT scan on 07/14/14. He is still having abdominal pain. He will be out of town after today until Friday. Wants to schedule OV. Scheduled with Alonza Bogus, PA on 08/10/14 at 1:30 PM.

## 2014-08-10 ENCOUNTER — Ambulatory Visit (INDEPENDENT_AMBULATORY_CARE_PROVIDER_SITE_OTHER): Payer: BLUE CROSS/BLUE SHIELD | Admitting: Gastroenterology

## 2014-08-10 ENCOUNTER — Encounter: Payer: Self-pay | Admitting: Gastroenterology

## 2014-08-10 VITALS — BP 110/70 | HR 60 | Ht 70.0 in | Wt 175.0 lb

## 2014-08-10 DIAGNOSIS — R1030 Lower abdominal pain, unspecified: Secondary | ICD-10-CM | POA: Diagnosis not present

## 2014-08-10 DIAGNOSIS — R1031 Right lower quadrant pain: Secondary | ICD-10-CM | POA: Diagnosis not present

## 2014-08-10 NOTE — Patient Instructions (Signed)
You will be getting a call from [facility] with your appointment information with Dr Marlou Starks. CC:  Tenny Craw MD

## 2014-08-10 NOTE — Progress Notes (Signed)
     08/10/2014 Richard Roach [id number] 10/24/1955   History of Present Illness:  This is a VERY anxious 59 year old male who was seen here previously for fatty liver.  He is here today with complaints of right lower quadrant/right groin pain for the past 3 months.  Hurts mostly with movement, bending, etc.  He's had 3 CT scans since 02/2014 (two of them just recently in March within one day of each other) that were unremarkable.  He's also had multiple abdominal ultrasounds.  Last colonoscopy 09/2012 with Dr. Fuller Plan at which time he had two polyps removed (one sessile serrated adenoma and the other a hyperplastic polyp); also had small internal hemorrhoids with repeat colonoscopy recommended in 5 years from that time.   Current Medications, Allergies, Past Medical History, Past Surgical History, Family History and Social History were reviewed in [employer] record.   Physical Exam: BP 110/70 mmHg  Pulse 60  Ht 5\' 10"  (1.778 m)  Wt 175 lb (79.379 kg)  BMI 25.11 kg/m2 General: Well developed white male in no acute distress; VERY anxious with pressured speech Head: Normocephalic and atraumatic Abdomen: Soft, non-distended.  Normal bowel sounds.  Mild TTP in RLQ/right groin.  No definite hernia noted. Musculoskeletal: Symmetrical with no gross deformities  Extremities: No edema  Neurological: Alert oriented x 4, grossly non-focal Psychological:  Alert and cooperative. Normal mood and affect  Assessment and Recommendations: -59 year old male with RLQ abdominal pain/right groin pain:  Likely musculoskeletal vs hernia although no definite hernia noted on exam today.  He's had 3 CT scans since October 2015 (two of them were within one day of each other).  Also multiple abdominal ultrasounds.  Up-to-date on colonoscopy.  Offered reassurance that I do not think that this is anything worrisome or GI/organ related.

## 2014-08-10 NOTE — Progress Notes (Signed)
Reviewed and agree with management plan.  Shelie Lansing T. Caster Fayette, MD FACG 

## 2014-08-12 ENCOUNTER — Encounter: Payer: Self-pay | Admitting: Gastroenterology

## 2014-08-13 ENCOUNTER — Ambulatory Visit: Payer: Self-pay | Admitting: Internal Medicine

## 2014-08-31 ENCOUNTER — Telehealth: Payer: Self-pay | Admitting: *Deleted

## 2014-08-31 NOTE — Telephone Encounter (Signed)
I have reviewed his recent GI evaluation, abd/pelvic CT scan and colonoscopy from 2014. Noted the patients report from Dr Linda Hedges that his hip is ok. There is no GI cause for his pain. I suspect the pain is musculoskeletal or neuropathic and these are out of my field. I don't need to see him for this problem as the GI evaluation is complete. He should review this with his PCP.

## 2014-08-31 NOTE — Telephone Encounter (Signed)
Received a call from Dr. Veverly Fells with orthopedics. He saw patient today and could not find an orthopedic problem causing patient's left quad pain. It is definitely not his hip. He wanted to let Dr. Fuller Plan know. States he has known this patient and he has not been one to talk about pain in the past. Patient has a new PCP Dr. Yisroel Ramming. He just wanted to be sure Dr. Fuller Plan did not want to see patient one more time for evaluation.

## 2014-09-03 NOTE — Telephone Encounter (Signed)
Patient notified of Dr. Lynne Leader response and recommendations.  He will follow up with his primary care

## 2014-09-03 NOTE — Telephone Encounter (Signed)
Left message for patient to call back  

## 2014-09-14 ENCOUNTER — Encounter: Payer: Self-pay | Admitting: Internal Medicine

## 2014-09-14 ENCOUNTER — Ambulatory Visit (INDEPENDENT_AMBULATORY_CARE_PROVIDER_SITE_OTHER): Payer: BLUE CROSS/BLUE SHIELD | Admitting: Internal Medicine

## 2014-09-14 VITALS — BP 118/70 | HR 62 | Temp 98.3°F | Resp 14 | Ht 70.0 in | Wt 175.2 lb

## 2014-09-14 DIAGNOSIS — I10 Essential (primary) hypertension: Secondary | ICD-10-CM

## 2014-09-14 DIAGNOSIS — R1031 Right lower quadrant pain: Secondary | ICD-10-CM | POA: Diagnosis not present

## 2014-09-14 NOTE — Assessment & Plan Note (Signed)
BP well controlled on atenolol 25 mg daily and HR okay at 62. Last BMP reviewed and no signs of complications with the kidneys. Will continue to monitor and talked to him about regular exercise.

## 2014-09-14 NOTE — Patient Instructions (Signed)
We do not need any labs today and will see you back in December for your physical.   If you have any problems or questions please feel free to call the office.

## 2014-09-14 NOTE — Assessment & Plan Note (Signed)
He does seem to have no pathologic cause for the pain and since it has improved significantly with conservative management muscle etiology seems most likely. IBS could still be in the differential although he does not have prominent GI symptoms. Fatty liver under surveillance and likely incidental finding. Reassured that he will likely continue to improve.

## 2014-09-14 NOTE — Progress Notes (Signed)
   Subjective:    Patient ID: Richard Roach, male    DOB: 11-10-1955, 59 y.o.   MRN: [id number]  HPI The patient is a 59 YO man who is coming in new today to discuss his high blood pressure and his muscle pains. He has been well controlled with his blood pressure and does not have any known complications. He has been on atenolol for many years. This muscle pain in his right lower quadrant has been bothering him for several months now. The pain started when he changed his exercise regimen and became more active. The pain was 6/10 originally and he had been taking OTC medication for it with reasonable relief. He now is having mild pains with motion or stretching (2/10) and is not needing OTC pain medication as much anymore. He has seen GI for it and had 2 CT scans and multiple ultrasounds. None of these has found any problems. He did have incidental finding of fatty liver and has had 3 scans of it to check for cirrhosis. He has cut back on his alcohol consumption (he hosts events for clients often)  Review of Systems  Constitutional: Negative for fever, activity change, appetite change, fatigue and unexpected weight change.  HENT: Negative.   Eyes: Negative.   Respiratory: Negative for cough, chest tightness, shortness of breath and wheezing.   Cardiovascular: Negative for chest pain, palpitations and leg swelling.  Gastrointestinal: Positive for abdominal pain. Negative for nausea, diarrhea, constipation, blood in stool and abdominal distention.  Musculoskeletal: Positive for myalgias. Negative for back pain, arthralgias and neck pain.  Skin: Negative.   Neurological: Negative.   Psychiatric/Behavioral: Negative.       Objective:   Physical Exam  Constitutional: He is oriented to person, place, and time. He appears well-developed and well-nourished.  HENT:  Head: Normocephalic and atraumatic.  Right Ear: External ear normal.  Left Ear: External ear normal.  Eyes: EOM are normal.  Neck: Normal  range of motion.  Cardiovascular: Normal rate and regular rhythm.   Pulmonary/Chest: Effort normal and breath sounds normal. No respiratory distress. He has no wheezes.  Abdominal: Soft. Bowel sounds are normal. He exhibits no distension. There is no tenderness.  Musculoskeletal: He exhibits no edema.  Neurological: He is alert and oriented to person, place, and time. Coordination normal.  Skin: Skin is warm and dry.  Psychiatric: He has a normal mood and affect.   Filed Vitals:   09/14/14 0900  BP: 118/70  Pulse: 62  Temp: 98.3 F (36.8 C)  TempSrc: Oral  Resp: 14  Height: 5\' 10"  (1.778 m)  Weight: 175 lb 3.2 oz (79.47 kg)  SpO2: 97%      Assessment & Plan:

## 2014-09-14 NOTE — Progress Notes (Signed)
Pre visit review using our clinic review tool, if applicable. No additional management support is needed unless otherwise documented below in the visit note. 

## 2014-10-02 ENCOUNTER — Ambulatory Visit: Payer: BC Managed Care – PPO | Admitting: Family Medicine

## 2014-11-05 ENCOUNTER — Encounter: Payer: Self-pay | Admitting: Internal Medicine

## 2014-11-05 ENCOUNTER — Telehealth: Payer: Self-pay | Admitting: Internal Medicine

## 2014-11-05 NOTE — Telephone Encounter (Signed)
Needs OV.  

## 2014-11-05 NOTE — Telephone Encounter (Signed)
Patient states that he has cholesterol testing in [place] and received elevated results. He wants to know if he can drop these off to be interpreted without a visit being needed, and he can be prescribed high BP meds. Please call to advise patient either way

## 2014-11-07 ENCOUNTER — Encounter: Payer: Self-pay | Admitting: Internal Medicine

## 2014-11-07 MED ORDER — PRAVASTATIN SODIUM 20 MG PO TABS: 20.0000 mg | ORAL_TABLET | Freq: Every day | ORAL | Status: DC

## 2014-11-13 ENCOUNTER — Encounter: Payer: Self-pay | Admitting: Internal Medicine

## 2014-11-16 ENCOUNTER — Ambulatory Visit: Payer: BLUE CROSS/BLUE SHIELD | Admitting: Internal Medicine

## 2014-11-17 ENCOUNTER — Encounter: Payer: Self-pay | Admitting: Internal Medicine

## 2014-11-19 MED ORDER — BUSPIRONE HCL 5 MG PO TABS: 5.0000 mg | ORAL_TABLET | Freq: Every day | ORAL | Status: DC | PRN

## 2014-11-23 ENCOUNTER — Telehealth: Payer: Self-pay | Admitting: Cardiology

## 2014-11-23 ENCOUNTER — Encounter: Payer: Self-pay | Admitting: Internal Medicine

## 2014-11-23 NOTE — Telephone Encounter (Signed)
Called pt, no symptoms other than feeling stressed out, has had 30-50 palpitations a day. Thinks is stress r/t job, other concerns. He is OK to f/u w/ PA.  Instructed to call if additional concerns/new symptoms.

## 2014-11-23 NOTE — Telephone Encounter (Signed)
Pt called in stating that he has been having about 50 to 60 palpitations a day and he would like to be seen as soon as possible . I offered him 7/29 with Dayna and he thinks that his situations requires something a little more urgent. Please call  Thanks

## 2014-11-28 ENCOUNTER — Encounter: Payer: Self-pay | Admitting: Internal Medicine

## 2014-12-06 ENCOUNTER — Encounter: Payer: Self-pay | Admitting: Physician Assistant

## 2014-12-06 NOTE — Progress Notes (Signed)
Cardiology Office Note Date:  12/07/2014  Patient ID:  Richard Roach, Richard Roach 10-01-1955, MRN [id number] PCP:  Olga Millers, MD  Cardiologist:  Stanford Breed  Chief Complaint: palpitations  History of Present Illness: Richard Roach is a 59 y.o. male with history of HTN, fatty liver, HLD, sinus bradycardia, palpitations who presents for evaluation of palpitations. He has been documented to have occasional PVCs by Holter 07/2013. 2D echo 04/2013: EF 65-70%, grade 2 DD, mild LAE. Normal ETT 04/2013. He's been on beta blocker therapy with atenolol. This had to be decreased in 04/2014 due to sinus bradycardia with HR in the 40s.  He's been under a lot of stress lately. He travels quite a bit for work and his daughter is getting married in 52 weeks. 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 Over the last few weeks he noticed an increase in palpitations up to 30-60 per day. They are described as a brief flip/skip. He has not had any sustained palpitations or runs of fast heart rates. He denies any chest pain, SOB, dizziness, syncope, bleeding, nausea or vomiting. He has been exercising without reported limitation. He actually feels like over the last few days his palpitations have decreased. He drinks 3 cups of coffee per day. He drinks 2 glasses of wine nightly.   Past Medical History  Diagnosis Date  . Hypercholesterolemia   . Allergic rhinitis   . Hypertension   . Colon polyps   . Fatty liver   . Inguinal hernia   . Serrated adenoma of colon   . Internal hemorrhoids   . PVC's (premature ventricular contractions)   . Sinus bradycardia     Past Surgical History  Procedure Laterality Date  . Knee arthroscopy Right June 2011  . Tonsillectomy      Current Outpatient Prescriptions  Medication Sig Dispense Refill  . aluminum chloride (DRYSOL) 20 % external solution Apply topically at bedtime.    . Ascorbic Acid (VITAMIN C) 1000 MG tablet Take 1,000 mg by mouth daily.    Marland Kitchen aspirin 81  MG tablet Take 81 mg by mouth daily.    Marland Kitchen atenolol (TENORMIN) 25 MG tablet Take 0.5 tablets (12.5 mg total) by mouth daily. 30 tablet 1  . B Complex Vitamins (VITAMIN B-COMPLEX) TABS Take 1 tablet by mouth daily.    . busPIRone (BUSPAR) 5 MG tablet Take 1 tablet (5 mg total) by mouth daily as needed (anxiety). 10 tablet 0  . ibuprofen (ADVIL,MOTRIN) 200 MG tablet Take 200 mg by mouth every 6 (six) hours as needed.    . Multiple Vitamin (MULTIVITAMIN) tablet Take 1 tablet by mouth daily.    . Omega-3 Fatty Acids (FISH OIL PO) Take 2 capsules by mouth daily.    . pravastatin (PRAVACHOL) 20 MG tablet Take 1 tablet (20 mg total) by mouth daily. 90 tablet 3  . VIAGRA 100 MG tablet TAKE 1 TABLET BY MOUTH DAILY 1 HOUR BEFORE NEEDED 6 tablet 3  . vitamin E 200 UNIT capsule Take 200 Units by mouth daily.     No current facility-administered medications for this visit.    Allergies:   Review of patient's allergies indicates no known allergies.   Social History:  The patient  reports that he quit smoking about 16 years ago. His smoking use included Cigarettes. He has never used smokeless tobacco. He reports that he drinks about 8.4 oz of alcohol per week. He reports that he does not use illicit drugs.  Family History:  The patient's family history includes Asthma in his father; High blood pressure in his mother. There is no history of Colon cancer, Colon polyps, Diabetes, Kidney disease, or Esophageal cancer.  ROS:  Please see the history of present illness.    All other systems are reviewed and otherwise negative.   PHYSICAL EXAM:  VS:  BP 118/68 mmHg  Pulse 61  Ht 5\' 11"  (1.803 m)  Wt 179 lb (81.194 kg)  BMI 24.98 kg/m2 BMI: Body mass index is 24.98 kg/(m^2). Well nourished, well developed WM in no acute distress HEENT: normocephalic, atraumatic Neck: no JVD, carotid bruits or masses Cardiac:  normal S1, S2; RRR; no murmurs, rubs, or gallops Lungs:  clear to auscultation bilaterally, no  wheezing, rhonchi or rales Abd: soft, nontender, no hepatomegaly, + BS MS: no deformity or atrophy Ext: no edema Skin: warm and dry, no rash Neuro:  moves all extremities spontaneously, no focal abnormalities noted, follows commands Psych: euthymic mood, full affect   EKG:  Done today shows NSR 61bpm, nonspecific ST upsloping inferiorl, TW flattening avL, no acute change from prior, QTc 46ms  Recent Labs: 04/23/2014: TSH 1.67 07/14/2014: ALT 33; BUN 19; Creatinine, Ser 1.13; Hemoglobin 16.5; Platelets 217; Potassium 3.7; Sodium 139  04/27/2014: Cholesterol CANCELED; HDL CANCELED; LDL Cholesterol NOT CALC; Total CHOL/HDL Ratio CANCELED; Triglycerides CANCELED; VLDL NOT CALC   CrCl cannot be calculated (Patient has no serum creatinine result on file.).   Wt Readings from Last 3 Encounters:  12/07/14 179 lb (81.194 kg)  09/14/14 175 lb 3.2 oz (79.47 kg)  08/10/14 175 lb (79.379 kg)     Other studies reviewed: Additional studies/records reviewed today include: summarized above  ASSESSMENT AND PLAN:  1. Palpitations - suspect he continues to have occasional PVCs. We discussed various strategies for evaluation. Before changing medications, he would like a repeat 48-hour Holter to exclude any other kind of arrhythmias which I think is completely reasonable given his regular caffeine/alcohol intake. We discussed importance of cutting down on both of these. If his PVCs are poorly controlled we can either try titrating atenolol up carefully, versus switch to either propranolol (given anti-anxiety component) or acebutolol (given specific indication for suppression of PVCs). His sinus bradycardia may be a limiting factor. Await Holter results. Will also check lytes (BMET, Mg) and TSH today.  2. H/o PVCs - see above. 3. H/o sinus bradycardia - will see how low his HR goes with monitor. He's not had any symptoms with this. 4. Hyperlipidemia - now followed by primary care. 5. Essential HTN -  controlled.  Disposition: F/u with Dr. Stanford Breed in 6 months.  Current medicines are reviewed at length with the patient today.  The patient did not have any concerns regarding medicines.  Raechel Ache PA-C 12/07/2014 12:31 PM     [facility] [address], [place], [place] [address] Phone: [phone]

## 2014-12-07 ENCOUNTER — Ambulatory Visit (INDEPENDENT_AMBULATORY_CARE_PROVIDER_SITE_OTHER): Payer: BLUE CROSS/BLUE SHIELD

## 2014-12-07 ENCOUNTER — Ambulatory Visit (INDEPENDENT_AMBULATORY_CARE_PROVIDER_SITE_OTHER): Payer: BLUE CROSS/BLUE SHIELD | Admitting: Physician Assistant

## 2014-12-07 ENCOUNTER — Encounter: Payer: Self-pay | Admitting: Physician Assistant

## 2014-12-07 VITALS — BP 118/68 | HR 61 | Ht 71.0 in | Wt 179.0 lb

## 2014-12-07 DIAGNOSIS — R001 Bradycardia, unspecified: Secondary | ICD-10-CM | POA: Diagnosis not present

## 2014-12-07 DIAGNOSIS — I493 Ventricular premature depolarization: Secondary | ICD-10-CM

## 2014-12-07 DIAGNOSIS — I10 Essential (primary) hypertension: Secondary | ICD-10-CM

## 2014-12-07 DIAGNOSIS — R002 Palpitations: Secondary | ICD-10-CM

## 2014-12-07 DIAGNOSIS — E785 Hyperlipidemia, unspecified: Secondary | ICD-10-CM

## 2014-12-07 LAB — TSH: TSH: 1.591 u[IU]/mL (ref 0.350–4.500)

## 2014-12-07 NOTE — Patient Instructions (Signed)
Your physician recommends that you return for lab work.  Your physician has recommended that you wear a holter monitor. Holter monitors are medical devices that record the heart's electrical activity. Doctors most often use these monitors to diagnose arrhythmias. Arrhythmias are problems with the speed or rhythm of the heartbeat. The monitor is a small, portable device. You can wear one while you do your normal daily activities. This is usually used to diagnose what is causing palpitations/syncope (passing out). This will be worn for 48 hours.  Your physician wants you to follow-up in: 6 months with Dr. Stanford Breed. You will receive a reminder letter in the mail two months in advance. If you don't receive a letter, please call our office to schedule the follow-up appointment.  The name if the I-phone monitor is [employer].

## 2014-12-07 NOTE — Addendum Note (Signed)
Addended byLauralee Evener. on: 12/07/2014 12:54 PM   Modules accepted: Level of Service

## 2014-12-08 LAB — BASIC METABOLIC PANEL
BUN: 19 mg/dL (ref 7–25)
CALCIUM: 9.1 mg/dL (ref 8.6–10.3)
CO2: 26 mmol/L (ref 20–31)
Chloride: 103 mmol/L (ref 98–110)
Creat: 1.03 mg/dL (ref 0.70–1.33)
GLUCOSE: 79 mg/dL (ref 65–99)
Potassium: 4.1 mmol/L (ref 3.5–5.3)
SODIUM: 139 mmol/L (ref 135–146)

## 2014-12-08 LAB — MAGNESIUM: Magnesium: 1.9 mg/dL (ref 1.5–2.5)

## 2014-12-13 ENCOUNTER — Encounter: Payer: Self-pay | Admitting: Physician Assistant

## 2014-12-27 ENCOUNTER — Telehealth: Payer: Self-pay | Admitting: Internal Medicine

## 2014-12-27 NOTE — Telephone Encounter (Signed)
Pt would like to know if he can transfer from [facility] to Dr Jenny Reichmann?    Is this ok?

## 2014-12-27 NOTE — Telephone Encounter (Signed)
Fine with me

## 2014-12-27 NOTE — Telephone Encounter (Signed)
Ok with me 

## 2014-12-28 NOTE — Telephone Encounter (Signed)
Got scheduled  °

## 2015-04-26 ENCOUNTER — Ambulatory Visit: Payer: BLUE CROSS/BLUE SHIELD | Admitting: Internal Medicine

## 2015-05-02 ENCOUNTER — Other Ambulatory Visit (INDEPENDENT_AMBULATORY_CARE_PROVIDER_SITE_OTHER): Payer: BLUE CROSS/BLUE SHIELD

## 2015-05-02 ENCOUNTER — Ambulatory Visit (INDEPENDENT_AMBULATORY_CARE_PROVIDER_SITE_OTHER): Payer: BLUE CROSS/BLUE SHIELD | Admitting: Internal Medicine

## 2015-05-02 ENCOUNTER — Encounter: Payer: Self-pay | Admitting: Internal Medicine

## 2015-05-02 VITALS — BP 116/70 | HR 59 | Temp 97.6°F | Ht 71.0 in | Wt 177.0 lb

## 2015-05-02 DIAGNOSIS — I10 Essential (primary) hypertension: Secondary | ICD-10-CM

## 2015-05-02 DIAGNOSIS — Z0000 Encounter for general adult medical examination without abnormal findings: Secondary | ICD-10-CM | POA: Diagnosis not present

## 2015-05-02 DIAGNOSIS — K635 Polyp of colon: Secondary | ICD-10-CM | POA: Insufficient documentation

## 2015-05-02 DIAGNOSIS — R1031 Right lower quadrant pain: Secondary | ICD-10-CM

## 2015-05-02 DIAGNOSIS — E7800 Pure hypercholesterolemia, unspecified: Secondary | ICD-10-CM

## 2015-05-02 DIAGNOSIS — I493 Ventricular premature depolarization: Secondary | ICD-10-CM | POA: Insufficient documentation

## 2015-05-02 DIAGNOSIS — K7400 Hepatic fibrosis, unspecified: Secondary | ICD-10-CM

## 2015-05-02 DIAGNOSIS — N400 Enlarged prostate without lower urinary tract symptoms: Secondary | ICD-10-CM | POA: Insufficient documentation

## 2015-05-02 DIAGNOSIS — F419 Anxiety disorder, unspecified: Secondary | ICD-10-CM | POA: Insufficient documentation

## 2015-05-02 DIAGNOSIS — F411 Generalized anxiety disorder: Secondary | ICD-10-CM

## 2015-05-02 DIAGNOSIS — D126 Benign neoplasm of colon, unspecified: Secondary | ICD-10-CM | POA: Insufficient documentation

## 2015-05-02 DIAGNOSIS — Z7289 Other problems related to lifestyle: Secondary | ICD-10-CM | POA: Insufficient documentation

## 2015-05-02 DIAGNOSIS — Z789 Other specified health status: Secondary | ICD-10-CM | POA: Insufficient documentation

## 2015-05-02 DIAGNOSIS — G8929 Other chronic pain: Secondary | ICD-10-CM | POA: Insufficient documentation

## 2015-05-02 HISTORY — DX: Other chronic pain: G89.29

## 2015-05-02 HISTORY — DX: Hepatic fibrosis, unspecified: K74.00

## 2015-05-02 HISTORY — DX: Generalized anxiety disorder: F41.1

## 2015-05-02 LAB — PSA: PSA: 1.42 ng/mL (ref 0.10–4.00)

## 2015-05-02 LAB — TSH: TSH: 1.41 u[IU]/mL (ref 0.35–4.50)

## 2015-05-02 NOTE — Assessment & Plan Note (Signed)
Declines trial ssri such as lexapro

## 2015-05-02 NOTE — Assessment & Plan Note (Signed)

## 2015-05-02 NOTE — Progress Notes (Signed)
Pre visit review using our clinic review tool, if applicable. No additional management support is needed unless otherwise documented below in the visit note. 

## 2015-05-02 NOTE — Patient Instructions (Signed)

## 2015-05-02 NOTE — Assessment & Plan Note (Signed)
stable overall by history and exam, recent data reviewed with pt, and pt to continue medical treatment as before,  to f/u any worsening symptoms or concerns BP Readings from Last 3 Encounters:  05/02/15 116/70  12/07/14 118/68  09/14/14 118/70

## 2015-05-02 NOTE — Progress Notes (Signed)
Subjective:    Patient ID: Richard Roach, male    DOB: 07-Sep-1955, 59 y.o.   MRN: [id number]  HPI   Here for wellness and establish as new patient;  Overall doing ok;  Pt denies Chest pain, worsening SOB, DOE, wheezing, orthopnea, PND, worsening LE edema, palpitations, dizziness or syncope.  Pt denies neurological change such as new headache, facial or extremity weakness.  Pt denies polydipsia, polyuria, or low sugar symptoms. Pt states overall good compliance with treatment and medications, good tolerability, and has been trying to follow appropriate diet.  Pt denies worsening depressive symptoms, suicidal ideation or panic. No fever, night sweats, wt loss, loss of appetite, or other constitutional symptoms.  Pt states good ability with ADL's, has low fall risk, home safety reviewed and adequate, no other significant changes in hearing or vision, and only occasionally active with exercise. 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 Past Medical History  Diagnosis Date  . Hypercholesterolemia   . Allergic rhinitis   . Hypertension   . Colon polyps   . Fatty liver   . Inguinal hernia   . Serrated adenoma of colon   . Internal hemorrhoids   . PVC's (premature ventricular contractions)   . Sinus bradycardia   . Anxiety state 05/02/2015  . Liver fibrosis ([place]) 05/02/2015  . Chronic RLQ pain 05/02/2015   Past Surgical History  Procedure Laterality Date  . Knee arthroscopy Right June 2011  . Tonsillectomy    . Knee arthroscopy Left 2012    reports that he quit smoking about 16 years ago. His smoking use included Cigarettes. He has never used smokeless tobacco. He reports that he drinks about 8.4 oz of alcohol per week. He reports that he does not use illicit drugs. family history includes Asthma in his father; High blood pressure in his mother. There is no history of Colon cancer, Colon polyps, Diabetes, Kidney disease, or Esophageal cancer. No Known Allergies Current  Outpatient Prescriptions on File Prior to Visit  Medication Sig Dispense Refill  . aluminum chloride (DRYSOL) 20 % external solution Apply topically at bedtime.    Marland Kitchen aspirin 81 MG tablet Take 81 mg by mouth daily.    Marland Kitchen atenolol (TENORMIN) 25 MG tablet Take 0.5 tablets (12.5 mg total) by mouth daily. 30 tablet 1  . pravastatin (PRAVACHOL) 20 MG tablet Take 1 tablet (20 mg total) by mouth daily. 90 tablet 3  . Ascorbic Acid (VITAMIN C) 1000 MG tablet Take 1,000 mg by mouth daily. Reported on 05/02/2015    . B Complex Vitamins (VITAMIN B-COMPLEX) TABS Take 1 tablet by mouth daily. Reported on 05/02/2015    . busPIRone (BUSPAR) 5 MG tablet Take 1 tablet (5 mg total) by mouth daily as needed (anxiety). (Patient not taking: Reported on 05/02/2015) 10 tablet 0  . ibuprofen (ADVIL,MOTRIN) 200 MG tablet Take 200 mg by mouth every 6 (six) hours as needed. Reported on 05/02/2015    . Multiple Vitamin (MULTIVITAMIN) tablet Take 1 tablet by mouth daily. Reported on 05/02/2015    . Omega-3 Fatty Acids (FISH OIL PO) Take 2 capsules by mouth daily. Reported on 05/02/2015    . VIAGRA 100 MG tablet TAKE 1 TABLET BY MOUTH DAILY 1 HOUR BEFORE NEEDED (Patient not taking: Reported on 05/02/2015) 6 tablet 3  . vitamin E 200 UNIT capsule Take 200 Units by mouth daily. Reported on 05/02/2015     No current facility-administered medications on file prior to visit.  Review of Systems Constitutional: Negative for increased diaphoresis, other activity, appetite or siginficant weight change other than noted HENT: Negative for worsening hearing loss, ear pain, facial swelling, mouth sores and neck stiffness.   Eyes: Negative for other worsening pain, redness or visual disturbance.  Respiratory: Negative for shortness of breath and wheezing  Cardiovascular: Negative for chest pain and palpitations.  Gastrointestinal: Negative for diarrhea, blood in stool, abdominal distention or other pain Genitourinary: Negative for  hematuria, flank pain or change in urine volume.  Musculoskeletal: Negative for myalgias or other joint complaints.  Skin: Negative for color change and wound or drainage.  Neurological: Negative for syncope and numbness. other than noted Hematological: Negative for adenopathy. or other swelling Psychiatric/Behavioral: Negative for hallucinations, SI, self-injury, decreased concentration or other worsening agitation.      Objective:   Physical Exam BP 116/70 mmHg  Pulse 59  Temp(Src) 97.6 F (36.4 C) (Oral)  Ht 5\' 11"  (1.803 m)  Wt 177 lb (80.287 kg)  BMI 24.70 kg/m2  SpO2 98% VS noted,  Constitutional: Pt is oriented to person, place, and time. Appears well-developed and well-nourished, in no significant distress Head: Normocephalic and atraumatic.  Right Ear: External ear normal.  Left Ear: External ear normal.  Nose: Nose normal.  Mouth/Throat: Oropharynx is clear and moist.  Eyes: Conjunctivae and EOM are normal. Pupils are equal, round, and reactive to light.  Neck: Normal range of motion. Neck supple. No JVD present. No tracheal deviation present or significant neck LA or mass Cardiovascular: Normal rate, regular rhythm, normal heart sounds and intact distal pulses.   Pulmonary/Chest: Effort normal and breath sounds without rales or wheezing  Abdominal: Soft. Bowel sounds are normal. NT. No HSM  Musculoskeletal: Normal range of motion. Exhibits no edema.  Lymphadenopathy:  Has no cervical adenopathy.  Neurological: Pt is alert and oriented to person, place, and time. Pt has normal reflexes. No cranial nerve deficit. Motor grossly intact Skin: Skin is warm and dry. No rash noted.  Psychiatric:  Has normal mood and affect. Behavior is normal.   Lab Results  Component Value Date   WBC 6.7 07/14/2014   HGB 16.5 07/14/2014   HCT 47.5 07/14/2014   PLT 217 07/14/2014   GLUCOSE 79 12/07/2014   CHOL CANCELED 04/27/2014   TRIG CANCELED 04/27/2014   HDL CANCELED 04/27/2014    LDLDIRECT 164.0 04/18/2012   LDLCALC NOT CALC 04/27/2014   ALT 33 07/14/2014   AST 28 07/14/2014   NA 139 12/07/2014   K 4.1 12/07/2014   CL 103 12/07/2014   CREATININE 1.03 12/07/2014   BUN 19 12/07/2014   CO2 26 12/07/2014   TSH 1.591 12/07/2014   PSA 1.51 04/23/2014   HGBA1C 5.6 04/27/2014       Assessment & Plan:

## 2015-05-02 NOTE — Assessment & Plan Note (Signed)
delcines statin for now,  O/w stable overall by history and exam, recent data reviewed with pt, and pt to continue medical treatment as before,  to f/u any worsening symptoms or concerns Lab Results  Component Value Date   LDLCALC NOT CALC 04/27/2014

## 2015-06-19 ENCOUNTER — Other Ambulatory Visit: Payer: Self-pay | Admitting: Internal Medicine

## 2015-06-20 ENCOUNTER — Telehealth: Payer: Self-pay | Admitting: Internal Medicine

## 2015-06-20 NOTE — Telephone Encounter (Signed)
Pt called in and said that his bp meds were denied, he is flying back out of town on Monday and needs those filled before Monday    [phone]  Best number

## 2015-06-21 MED ORDER — ATENOLOL 25 MG PO TABS: 25.0000 mg | ORAL_TABLET | Freq: Every day | ORAL | Status: DC

## 2015-06-21 NOTE — Telephone Encounter (Signed)
I have not seen a PA for this patient. Perhaps this is related to the 06/19/2015 Rx refill that was sent to Dr Melvyn Novas?

## 2015-06-21 NOTE — Telephone Encounter (Signed)
Received call from pt he states he have been trying to get his Atenolol fill x's 3 days now pharmacy stated that request was denied. Inform pt per chart request went to Dr. Melvyn Novas office, and they did deny. Pt became new pt with Dr. Jenny Reichmann back in Dec. Verified mg & directions on Atenolol. Pt states he now takes a whole pill. Inform pt will update med list and send refill to [facility]...Richard Roach

## 2015-06-21 NOTE — Telephone Encounter (Signed)
Odis Hollingshead, is this a PA that he is talking about, I have not seen anything on it.

## 2015-08-12 DIAGNOSIS — G8929 Other chronic pain: Secondary | ICD-10-CM | POA: Diagnosis not present

## 2015-08-12 DIAGNOSIS — K740 Hepatic fibrosis: Secondary | ICD-10-CM | POA: Diagnosis not present

## 2015-08-12 DIAGNOSIS — R945 Abnormal results of liver function studies: Secondary | ICD-10-CM | POA: Diagnosis not present

## 2015-08-12 DIAGNOSIS — E785 Hyperlipidemia, unspecified: Secondary | ICD-10-CM | POA: Diagnosis not present

## 2015-08-12 DIAGNOSIS — K760 Fatty (change of) liver, not elsewhere classified: Secondary | ICD-10-CM | POA: Diagnosis not present

## 2015-08-12 DIAGNOSIS — R1031 Right lower quadrant pain: Secondary | ICD-10-CM | POA: Diagnosis not present

## 2015-08-12 DIAGNOSIS — F419 Anxiety disorder, unspecified: Secondary | ICD-10-CM | POA: Diagnosis not present

## 2015-09-20 DIAGNOSIS — T1501XA Foreign body in cornea, right eye, initial encounter: Secondary | ICD-10-CM | POA: Diagnosis not present

## 2015-09-20 DIAGNOSIS — G514 Facial myokymia: Secondary | ICD-10-CM | POA: Diagnosis not present

## 2015-11-03 DIAGNOSIS — H00014 Hordeolum externum left upper eyelid: Secondary | ICD-10-CM | POA: Diagnosis not present

## 2016-01-14 DIAGNOSIS — S60561A Insect bite (nonvenomous) of right hand, initial encounter: Secondary | ICD-10-CM | POA: Diagnosis not present

## 2016-01-14 DIAGNOSIS — L255 Unspecified contact dermatitis due to plants, except food: Secondary | ICD-10-CM | POA: Diagnosis not present

## 2016-01-15 ENCOUNTER — Telehealth: Payer: Self-pay | Admitting: *Deleted

## 2016-01-15 MED ORDER — METOPROLOL SUCCINATE ER 25 MG PO TB24: 25.0000 mg | ORAL_TABLET | Freq: Every day | ORAL | 3 refills | Status: DC

## 2016-01-15 NOTE — Telephone Encounter (Signed)
Rec'd call pharmacist is requesting alternative for Atenolol due to med being on back order. Pls advise...Johny Chess

## 2016-01-15 NOTE — Telephone Encounter (Signed)
Ok for change atenolol to toprol xl 25 mg - done erx to [employer] [facility]

## 2016-01-17 ENCOUNTER — Other Ambulatory Visit: Payer: Self-pay | Admitting: Internal Medicine

## 2016-02-06 DIAGNOSIS — E7800 Pure hypercholesterolemia, unspecified: Secondary | ICD-10-CM | POA: Insufficient documentation

## 2016-02-06 DIAGNOSIS — K760 Fatty (change of) liver, not elsewhere classified: Secondary | ICD-10-CM | POA: Diagnosis not present

## 2016-02-17 DIAGNOSIS — K760 Fatty (change of) liver, not elsewhere classified: Secondary | ICD-10-CM | POA: Diagnosis not present

## 2016-02-26 ENCOUNTER — Telehealth: Payer: Self-pay | Admitting: Emergency Medicine

## 2016-02-26 NOTE — Telephone Encounter (Signed)
I have referred patients to [facility] in the past, or [facility].  There are also ETOH counseling and detox centers in [place], but I would not have a preference on this. thanks

## 2016-02-26 NOTE — Telephone Encounter (Signed)
Pt called and wants to know if you recommend somebody to see or talk to for alcohol abuse. It is for his 59 yr old son. Please advise thanks.

## 2016-03-08 DIAGNOSIS — Z23 Encounter for immunization: Secondary | ICD-10-CM | POA: Diagnosis not present

## 2016-03-15 DIAGNOSIS — H43811 Vitreous degeneration, right eye: Secondary | ICD-10-CM | POA: Diagnosis not present

## 2016-03-18 DIAGNOSIS — H2513 Age-related nuclear cataract, bilateral: Secondary | ICD-10-CM | POA: Diagnosis not present

## 2016-03-18 DIAGNOSIS — H5319 Other subjective visual disturbances: Secondary | ICD-10-CM | POA: Diagnosis not present

## 2016-03-18 DIAGNOSIS — H35033 Hypertensive retinopathy, bilateral: Secondary | ICD-10-CM | POA: Diagnosis not present

## 2016-03-18 DIAGNOSIS — I10 Essential (primary) hypertension: Secondary | ICD-10-CM | POA: Diagnosis not present

## 2016-03-20 DIAGNOSIS — H2513 Age-related nuclear cataract, bilateral: Secondary | ICD-10-CM | POA: Diagnosis not present

## 2016-03-20 DIAGNOSIS — H43811 Vitreous degeneration, right eye: Secondary | ICD-10-CM | POA: Diagnosis not present

## 2016-03-20 DIAGNOSIS — H35033 Hypertensive retinopathy, bilateral: Secondary | ICD-10-CM | POA: Diagnosis not present

## 2016-04-22 DIAGNOSIS — H2513 Age-related nuclear cataract, bilateral: Secondary | ICD-10-CM | POA: Diagnosis not present

## 2016-04-22 DIAGNOSIS — H35033 Hypertensive retinopathy, bilateral: Secondary | ICD-10-CM | POA: Diagnosis not present

## 2016-04-22 DIAGNOSIS — H43811 Vitreous degeneration, right eye: Secondary | ICD-10-CM | POA: Diagnosis not present

## 2016-04-24 ENCOUNTER — Other Ambulatory Visit (INDEPENDENT_AMBULATORY_CARE_PROVIDER_SITE_OTHER): Payer: BLUE CROSS/BLUE SHIELD

## 2016-04-24 ENCOUNTER — Encounter: Payer: Self-pay | Admitting: Internal Medicine

## 2016-04-24 ENCOUNTER — Ambulatory Visit (INDEPENDENT_AMBULATORY_CARE_PROVIDER_SITE_OTHER): Payer: BLUE CROSS/BLUE SHIELD | Admitting: Internal Medicine

## 2016-04-24 VITALS — BP 136/74 | HR 57 | Resp 20 | Wt 183.0 lb

## 2016-04-24 DIAGNOSIS — E7800 Pure hypercholesterolemia, unspecified: Secondary | ICD-10-CM | POA: Diagnosis not present

## 2016-04-24 DIAGNOSIS — N529 Male erectile dysfunction, unspecified: Secondary | ICD-10-CM | POA: Diagnosis not present

## 2016-04-24 DIAGNOSIS — R002 Palpitations: Secondary | ICD-10-CM | POA: Diagnosis not present

## 2016-04-24 DIAGNOSIS — F411 Generalized anxiety disorder: Secondary | ICD-10-CM | POA: Diagnosis not present

## 2016-04-24 DIAGNOSIS — I10 Essential (primary) hypertension: Secondary | ICD-10-CM

## 2016-04-24 DIAGNOSIS — Z0001 Encounter for general adult medical examination with abnormal findings: Secondary | ICD-10-CM | POA: Diagnosis not present

## 2016-04-24 LAB — URINALYSIS, ROUTINE W REFLEX MICROSCOPIC
Bilirubin Urine: NEGATIVE
HGB URINE DIPSTICK: NEGATIVE
Ketones, ur: NEGATIVE
Leukocytes, UA: NEGATIVE
Nitrite: NEGATIVE
PH: 6 (ref 5.0–8.0)
RBC / HPF: NONE SEEN (ref 0–?)
SPECIFIC GRAVITY, URINE: 1.015 (ref 1.000–1.030)
TOTAL PROTEIN, URINE-UPE24: NEGATIVE
URINE GLUCOSE: NEGATIVE
Urobilinogen, UA: 0.2 (ref 0.0–1.0)
WBC, UA: NONE SEEN (ref 0–?)

## 2016-04-24 LAB — HEPATIC FUNCTION PANEL
ALT: 22 U/L (ref 0–53)
AST: 17 U/L (ref 0–37)
Albumin: 4.4 g/dL (ref 3.5–5.2)
Alkaline Phosphatase: 61 U/L (ref 39–117)
Bilirubin, Direct: 0.2 mg/dL (ref 0.0–0.3)
Total Bilirubin: 0.8 mg/dL (ref 0.2–1.2)
Total Protein: 6.9 g/dL (ref 6.0–8.3)

## 2016-04-24 LAB — CBC WITH DIFFERENTIAL/PLATELET
BASOS PCT: 0.5 % (ref 0.0–3.0)
Basophils Absolute: 0 10*3/uL (ref 0.0–0.1)
EOS ABS: 0.1 10*3/uL (ref 0.0–0.7)
EOS PCT: 1.8 % (ref 0.0–5.0)
HCT: 47.1 % (ref 39.0–52.0)
HEMOGLOBIN: 16.4 g/dL (ref 13.0–17.0)
LYMPHS ABS: 1.6 10*3/uL (ref 0.7–4.0)
Lymphocytes Relative: 24.9 % (ref 12.0–46.0)
MCHC: 34.7 g/dL (ref 30.0–36.0)
MCV: 88.7 fl (ref 78.0–100.0)
MONO ABS: 0.5 10*3/uL (ref 0.1–1.0)
Monocytes Relative: 8.5 % (ref 3.0–12.0)
NEUTROS PCT: 64.3 % (ref 43.0–77.0)
Neutro Abs: 4.1 10*3/uL (ref 1.4–7.7)
PLATELETS: 240 10*3/uL (ref 150.0–400.0)
RBC: 5.31 Mil/uL (ref 4.22–5.81)
RDW: 13.1 % (ref 11.5–15.5)
WBC: 6.3 10*3/uL (ref 4.0–10.5)

## 2016-04-24 LAB — TSH: TSH: 1.31 u[IU]/mL (ref 0.35–4.50)

## 2016-04-24 LAB — BASIC METABOLIC PANEL
BUN: 16 mg/dL (ref 6–23)
CALCIUM: 9.4 mg/dL (ref 8.4–10.5)
CO2: 30 meq/L (ref 19–32)
CREATININE: 1.06 mg/dL (ref 0.40–1.50)
Chloride: 103 mEq/L (ref 96–112)
GFR: 75.99 mL/min (ref >60.00)
GLUCOSE: 108 mg/dL — AB (ref 70–99)
Potassium: 4.6 mEq/L (ref 3.5–5.1)
Sodium: 140 mEq/L (ref 135–145)

## 2016-04-24 LAB — PSA: PSA: 1.69 ng/mL (ref 0.10–4.00)

## 2016-04-24 LAB — LIPID PANEL
CHOLESTEROL: 204 mg/dL — AB (ref 0–200)
HDL: 62.7 mg/dL (ref >39.00)
LDL Cholesterol: 129 mg/dL — ABNORMAL HIGH (ref 0–99)
NONHDL: 141.49
TRIGLYCERIDES: 60 mg/dL (ref 0.0–149.0)
Total CHOL/HDL Ratio: 3
VLDL: 12 mg/dL (ref 0.0–40.0)

## 2016-04-24 MED ORDER — SILDENAFIL CITRATE 100 MG PO TABS: 50.0000 mg | ORAL_TABLET | Freq: Every day | ORAL | 11 refills | Status: DC | PRN

## 2016-04-24 MED ORDER — METOPROLOL SUCCINATE ER 25 MG PO TB24: 25.0000 mg | ORAL_TABLET | Freq: Every day | ORAL | 3 refills | Status: DC

## 2016-04-24 MED ORDER — ROSUVASTATIN CALCIUM 20 MG PO TABS: 20.0000 mg | ORAL_TABLET | Freq: Every day | ORAL | 3 refills | Status: DC

## 2016-04-24 NOTE — Progress Notes (Signed)
Subjective:    Patient ID: Richard Roach, male    DOB: Feb 08, 1956, 60 y.o.   MRN: [id number]  HPI Here for wellness and f/u;  Overall doing ok;  Pt denies Chest pain, worsening SOB, DOE, wheezing, orthopnea, PND, worsening LE edema, palpitations, dizziness or syncope.  Pt denies neurological change such as new headache, facial or extremity weakness.  Pt denies polydipsia, polyuria, or low sugar symptoms. Pt states overall good compliance with treatment and medications, good tolerability, and has been trying to follow appropriate diet.  Pt denies worsening depressive symptoms, suicidal ideation or panic. No fever, night sweats, wt loss, loss of appetite, or other constitutional symptoms.  Pt states good ability with ADL's, has low fall risk, home safety reviewed and adequate, no other significant changes in hearing or vision, and only occasionally active with exercise.    No other history changes except; Has been seeing GI/liver at [facility] with fatty liver, no evidence of ciirrhosis, tx as IBS dec 2015 and referred back to local Dr Justin Mend.   Brother with recent hx of prostate ca., wants to make sure to get a PSA  Has smoked in past but quit x 25 yrs.   Admits to not taking the pravastatin regularly.  Also with 6 mo worsening mild ED symptoms, Past Medical History:  Diagnosis Date  . Allergic rhinitis   . Anxiety state 05/02/2015  . Chronic RLQ pain 05/02/2015  . Colon polyps   . Fatty liver   . Hypercholesterolemia   . Hypertension   . Inguinal hernia   . Internal hemorrhoids   . Liver fibrosis ([place]) 05/02/2015  . PVC's (premature ventricular contractions)   . Serrated adenoma of colon   . Sinus bradycardia    Past Surgical History:  Procedure Laterality Date  . KNEE ARTHROSCOPY Right June 2011  . KNEE ARTHROSCOPY Left 2012  . TONSILLECTOMY      reports that he quit smoking about 17 years ago. His smoking use included Cigarettes. He has never used smokeless tobacco. He reports that  he drinks about 8.4 oz of alcohol per week . He reports that he does not use drugs. family history includes Asthma in his father; High blood pressure in his mother. No Known Allergies Current Outpatient Prescriptions on File Prior to Visit  Medication Sig Dispense Refill  . aluminum chloride (DRYSOL) 20 % external solution Apply topically at bedtime.    Marland Kitchen aspirin 81 MG tablet Take 81 mg by mouth daily.    . Ascorbic Acid (VITAMIN C) 1000 MG tablet Take 1,000 mg by mouth daily. Reported on 05/02/2015    . B Complex Vitamins (VITAMIN B-COMPLEX) TABS Take 1 tablet by mouth daily. Reported on 05/02/2015    . ibuprofen (ADVIL,MOTRIN) 200 MG tablet Take 200 mg by mouth every 6 (six) hours as needed. Reported on 05/02/2015    . Multiple Vitamin (MULTIVITAMIN) tablet Take 1 tablet by mouth daily. Reported on 05/02/2015    . Omega-3 Fatty Acids (FISH OIL PO) Take 2 capsules by mouth daily. Reported on 05/02/2015    . vitamin E 200 UNIT capsule Take 200 Units by mouth daily. Reported on 05/02/2015     No current facility-administered medications on file prior to visit.    Review of Systems Constitutional: Negative for increased diaphoresis, or other activity, appetite or siginficant weight change other than noted HENT: Negative for worsening hearing loss, ear pain, facial swelling, mouth sores and neck stiffness.   Eyes: Negative for other worsening pain,  redness or visual disturbance.  Respiratory: Negative for choking or stridor Cardiovascular: Negative for other chest pain and palpitations.  Gastrointestinal: Negative for worsening diarrhea, blood in stool, or abdominal distention Genitourinary: Negative for hematuria, flank pain or change in urine volume.  Musculoskeletal: Negative for myalgias or other joint complaints.  Skin: Negative for other color change and wound or drainage.  Neurological: Negative for syncope and numbness. other than noted Hematological: Negative for adenopathy. or other  swelling Psychiatric/Behavioral: Negative for hallucinations, SI, self-injury, decreased concentration or other worsening agitation.  All other system neg per pt    Objective:   Physical Exam BP 136/74   Pulse (!) 57   Resp 20   Wt 183 lb (83 kg)   SpO2 97%   BMI 25.52 kg/m  VS noted,  Constitutional: Pt is oriented to person, place, and time. Appears well-developed and well-nourished, in no significant distress Head: Normocephalic and atraumatic  Eyes: Conjunctivae and EOM are normal. Pupils are equal, round, and reactive to light Right Ear: External ear normal.  Left Ear: External ear normal Nose: Nose normal.  Mouth/Throat: Oropharynx is clear and moist  Neck: Normal range of motion. Neck supple. No JVD present. No tracheal deviation present or significant neck LA or mass Cardiovascular: Normal rate, regular rhythm, normal heart sounds and intact distal pulses.   Pulmonary/Chest: Effort normal and breath sounds without rales or wheezing  Abdominal: Soft. Bowel sounds are normal. NT. No HSM  Musculoskeletal: Normal range of motion. Exhibits no edema Lymphadenopathy: Has no cervical adenopathy.  Neurological: Pt is alert and oriented to person, place, and time. Pt has normal reflexes. No cranial nerve deficit. Motor grossly intact Skin: Skin is warm and dry. No rash noted or new ulcers Psychiatric:  Has nervous mood and affect. Behavior is normal.  No other new exam findings.  ECG today I have personally interpreted Sinus  Bradycardia  -Right atrial enlargement.     Assessment & Plan:

## 2016-04-24 NOTE — Patient Instructions (Signed)
Ok to change the pravastatin to crestor 20 mg per day  Please continue all other medications as before, and refills have been done if requested, including the viagra  Please have the pharmacy call with any other refills you may need.  Please continue your efforts at being more active, low cholesterol diet, and weight control.  You are otherwise up to date with prevention measures today.  Please keep your appointments with your specialists as you may have planned  Please go to the LAB in the Basement (turn left off the elevator) for the tests to be done today  You will be contacted by phone if any changes need to be made immediately.  Otherwise, you will receive a letter about your results with an explanation, but please check with MyChart first.  Please remember to sign up for MyChart if you have not done so, as this will be important to you in the future with finding out test results, communicating by private email, and scheduling acute appointments online when needed.  Please return in 1 year for your yearly visit, or sooner if needed, with Lab testing done 3-5 days before

## 2016-04-24 NOTE — Progress Notes (Signed)
Pre visit review using our clinic review tool, if applicable. No additional management support is needed unless otherwise documented below in the visit note. 

## 2016-04-25 NOTE — Assessment & Plan Note (Signed)
Mild uncontrolled, to change pravachol to crestor20 qd

## 2016-04-25 NOTE — Assessment & Plan Note (Addendum)
Mild to mod, for viagra prn,  to f/u any worsening symptoms or concerns  In addition to the time spent performing CPE, I spent an additional 15 minutes face to face,in which greater than 50% of this time was spent in counseling and coordination of care for patient's illness as documented.

## 2016-04-25 NOTE — Assessment & Plan Note (Signed)
stable overall by history and exam, recent data reviewed with pt, and pt to continue medical treatment as before,  to f/u any worsening symptoms or concerns BP Readings from Last 3 Encounters:  04/24/16 136/74  05/02/15 116/70  12/07/14 118/68

## 2016-04-25 NOTE — Assessment & Plan Note (Signed)

## 2016-04-25 NOTE — Assessment & Plan Note (Signed)
Mild persistent, declines SSRI trial or counseling, to f/u any worsening symptoms or concerns

## 2016-05-05 IMAGING — US US ABDOMEN COMPLETE W/ ELASTOGRAPHY
1 series · 13 of 25 positions shown · non-contrast
Comparison: CT 02/19/2014

CLINICAL DATA: Hepatitis-C



[Series 1: us abdomen complete w/ elastography · 0.18mm/px · 13 of 98 slices shown]
[im 1/98]
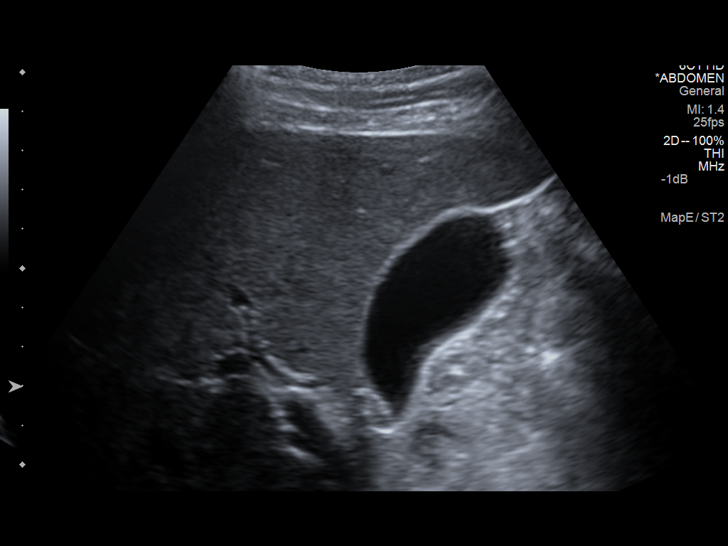
[im 9/98]
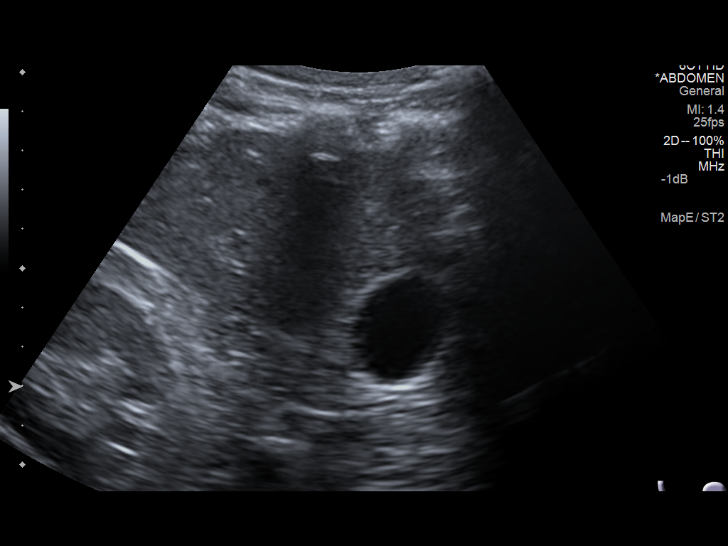
[im 17/98]
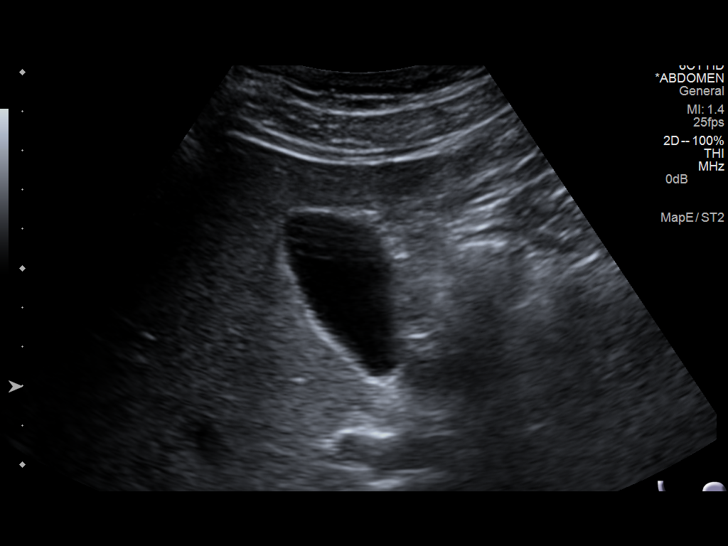
[im 25/98]
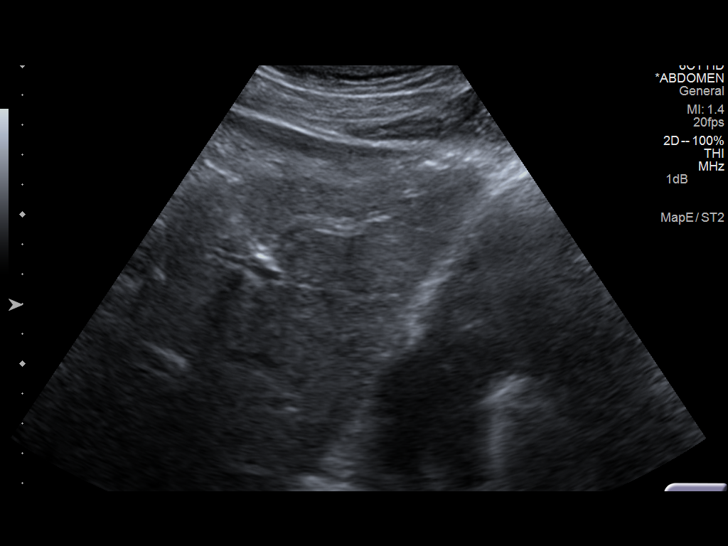
[im 33/98]
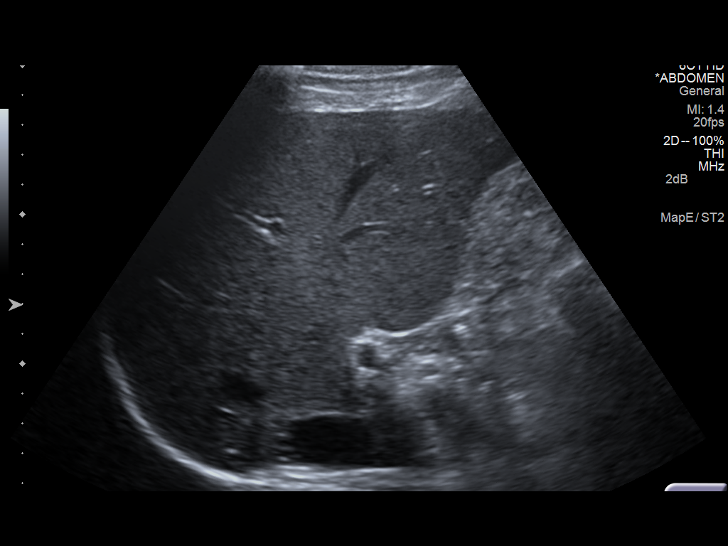
[im 41/98]
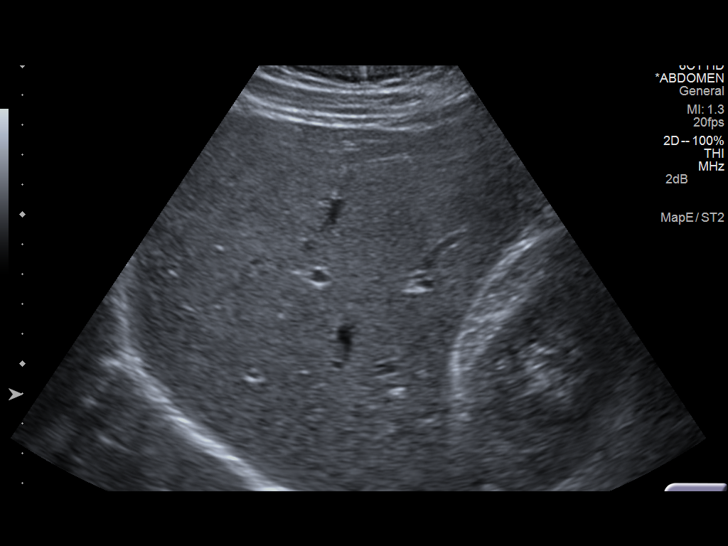
[im 49/98]
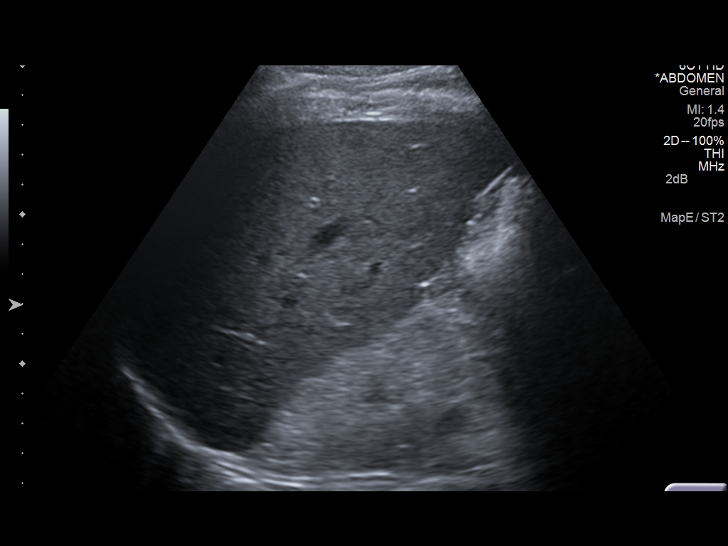
[im 57/98]
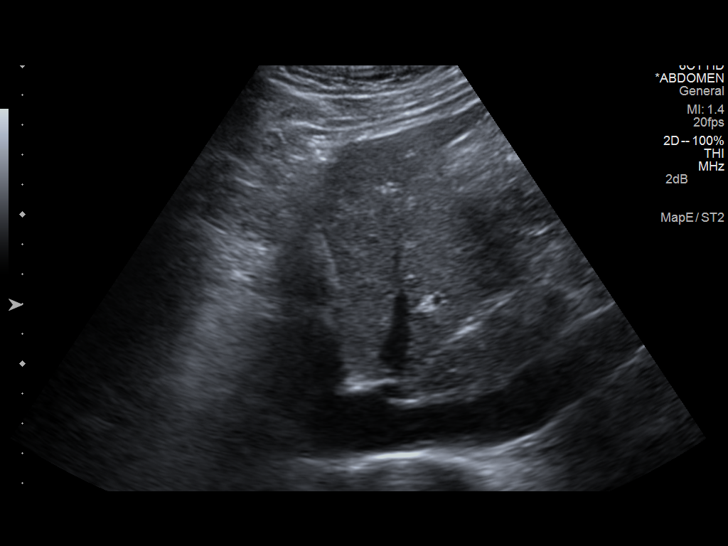
[im 65/98]
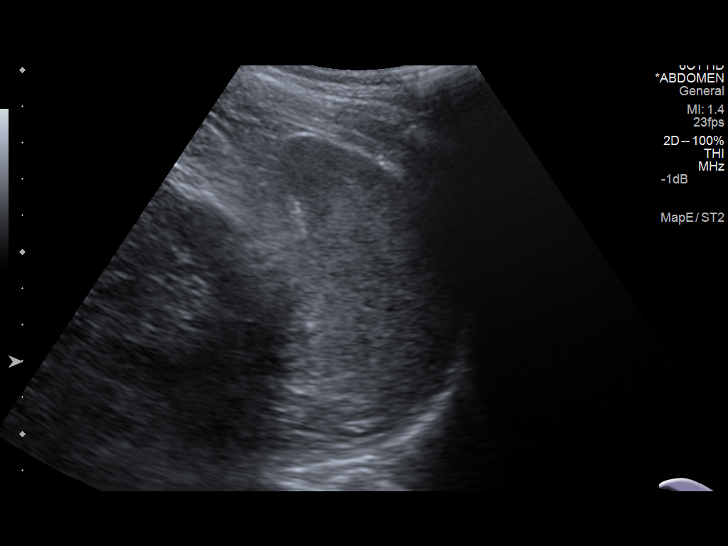
[im 73/98]
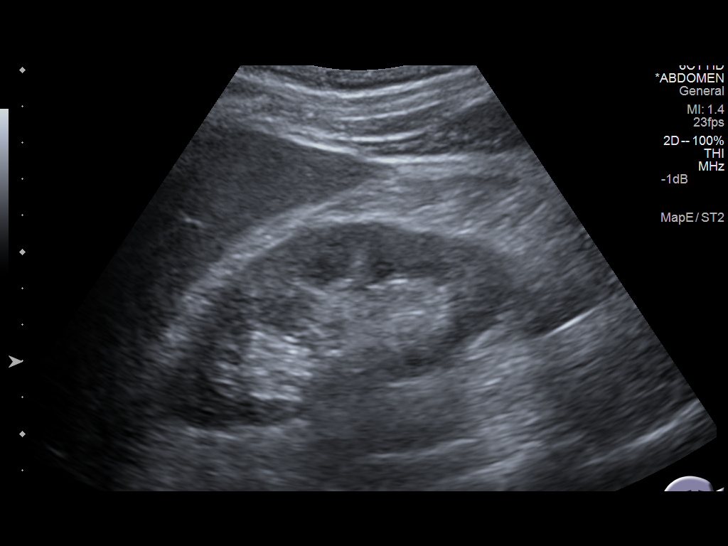
[im 81/98]
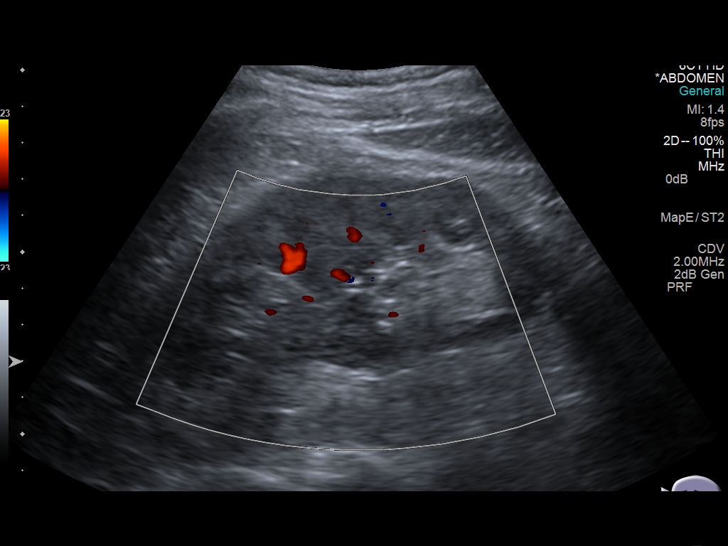
[im 89/98]
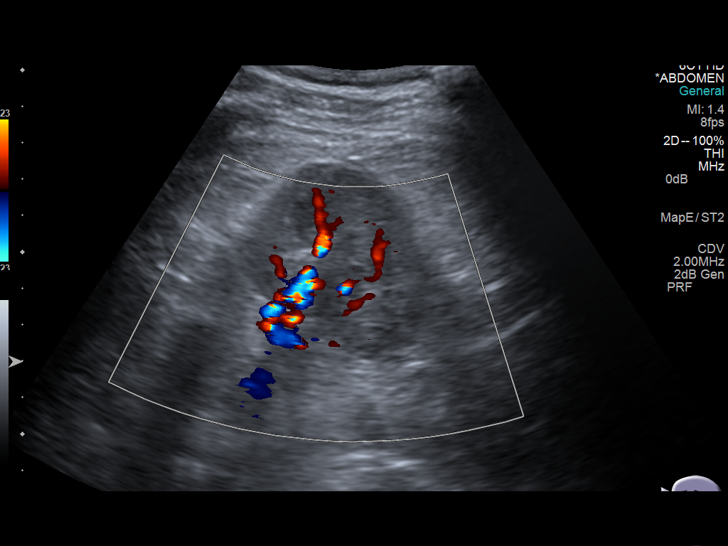
[im 98/98]
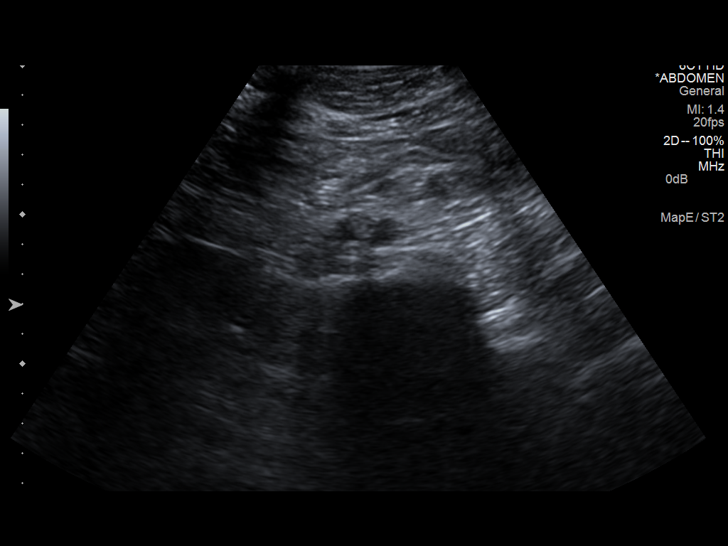

[13 of 25 positions shown; findings below may reference images not displayed]

FINDINGS: ULTRASOUND ABDOMEN

Gallbladder: No gallstones or wall thickening visualized. No
sonographic Murphy sign noted.

Common bile duct: Diameter: Normal caliber, 2 mm

Liver: No focal lesion identified. Within normal limits in
parenchymal echogenicity.

IVC: No abnormality visualized.

Pancreas: Visualized portion unremarkable.

Spleen: Size and appearance within normal limits.

Right Kidney: Length: 10.6 cm. Echogenicity within normal limits. No
mass or hydronephrosis visualized.

Left Kidney: Length: 10.7 cm. Echogenicity within normal limits. No
mass or hydronephrosis visualized.

Abdominal aorta: No aneurysm visualized.

Other findings: None.

ULTRASOUND HEPATIC ELASTOGRAPHY

Device: Siemens Helix VTQ

Transducer 6 C1

Patient position: Supine

Number of measurements:  10

Hepatic Segment:  8

Median velocity:   1.25  m/sec

IQR:

IQR/Median velocity ratio

Corresponding Metavir fibrosis score:  F 2+ some F 3

Risk of fibrosis: Moderate

Limitations of exam: None

Pertinent findings noted on other imaging exams:  None

Please note that abnormal shear wave velocities may also be
identified in clinical settings other than with hepatic fibrosis,
such as: acute hepatitis, elevated right heart and central venous
pressures including use of beta blockers, No disease
([place]), infiltrative processes such as
mastocytosis/amyloidosis/infiltrative tumor, extrahepatic
cholestasis, in the post-prandial state, and liver transplantation.
Correlation with patient history, laboratory data, and clinical
condition recommended.
IMPRESSION: No acute or significant abnormality on abdominal ultrasound.

Median hepatic shear wave velocity is calculated at 1.25 m/sec.

Corresponding Metavir fibrosis score is F 2+ some F 3.

Risk of fibrosis is moderate.

Follow-up:  Additional testing appropriate

## 2016-06-04 ENCOUNTER — Encounter: Payer: Self-pay | Admitting: Internal Medicine

## 2016-06-04 DIAGNOSIS — K760 Fatty (change of) liver, not elsewhere classified: Secondary | ICD-10-CM | POA: Diagnosis not present

## 2016-06-04 DIAGNOSIS — R1010 Upper abdominal pain, unspecified: Secondary | ICD-10-CM | POA: Diagnosis not present

## 2016-06-09 DIAGNOSIS — R1010 Upper abdominal pain, unspecified: Secondary | ICD-10-CM | POA: Diagnosis not present

## 2016-06-25 DIAGNOSIS — S61211A Laceration without foreign body of left index finger without damage to nail, initial encounter: Secondary | ICD-10-CM | POA: Diagnosis not present

## 2016-06-25 DIAGNOSIS — J019 Acute sinusitis, unspecified: Secondary | ICD-10-CM | POA: Diagnosis not present

## 2016-09-18 ENCOUNTER — Ambulatory Visit (INDEPENDENT_AMBULATORY_CARE_PROVIDER_SITE_OTHER): Payer: BLUE CROSS/BLUE SHIELD | Admitting: Family

## 2016-09-18 ENCOUNTER — Encounter: Payer: Self-pay | Admitting: Family

## 2016-09-18 VITALS — BP 118/70 | HR 74 | Temp 98.0°F | Resp 16 | Ht 71.0 in | Wt 186.4 lb

## 2016-09-18 DIAGNOSIS — H6121 Impacted cerumen, right ear: Secondary | ICD-10-CM | POA: Diagnosis not present

## 2016-09-18 NOTE — Assessment & Plan Note (Addendum)
Symptoms consistent with impacted cerumen of the right ear. Cerumen cleansed with improved hearing. Preventative care discussed. Follow up as needed.

## 2016-09-18 NOTE — Patient Instructions (Signed)
Thank you for choosing [facility].  SUMMARY AND INSTRUCTIONS:  Avoid using Q-tips.  To prevent wax buildup within the ear:   Use equal parts of water and white vinegar  Soak a cotton ball in the solution and place several drops within the ear  Insert cotton ball in external ear canal and let sit for 30 minutes prior to shower  Remove cotton ball and gently irrigate the ear canal in the shower.  Do not irrigate directly into the ear but rather let it hit the external canal and irrigate.  For maintenance, this can be done 1 time weekly.  Follow up:  If your symptoms worsen or fail to improve, please contact our office for further instruction, or in case of emergency go directly to the emergency room at the closest medical facility.    Earwax Buildup Your ears make a substance called earwax. It may also be called cerumen. Sometimes, too much earwax builds up in your ear canal. This can cause ear pain and make it harder for you to hear. CAUSES This condition is caused by too much earwax production or buildup. RISK FACTORS The following factors may make you more likely to develop this condition:  Cleaning your ears often with swabs.  Having narrow ear canals.  Having earwax that is overly thick or sticky.  Having eczema.  Being dehydrated. SYMPTOMS Symptoms of this condition include:  Reduced hearing.  Ear drainage.  Ear pain.  Ear itch.  A feeling of fullness in the ear or feeling that the ear is plugged.  Ringing in the ear.  Coughing. DIAGNOSIS Your health care provider can diagnose this condition based on your symptoms and medical history. Your health care provider will also do an ear exam to look inside your ear with a scope (otoscope). You may also have a hearing test. TREATMENT Treatment for this condition includes:  Over-the-counter or prescription ear drops to soften the earwax.  Earwax removal by a health care provider. This may be  done:  By flushing the ear with body-temperature water.  With a medical instrument that has a loop at the end (earwax curette).  With a suction device. HOME CARE INSTRUCTIONS  Take over-the-counter and prescription medicines only as told by your health care provider.  Do not put any objects, including an ear swab, into your ear. You can clean the opening of your ear canal with a washcloth.  Drink enough water to keep your urine clear or pale yellow.  If you have frequent earwax buildup or you use hearing aids, consider seeing your health care provider every 6-12 months for routine preventive ear cleanings. Keep all follow-up visits as told by your health care provider. SEEK MEDICAL CARE IF:  You have ear pain.  Your condition does not improve with treatment.  You have hearing loss.  You have blood, pus, or other fluid coming from your ear. This information is not intended to replace advice given to you by your health care provider. Make sure you discuss any questions you have with your health care provider. Document Released: 06/04/2004 Document Revised: 08/19/2015 Document Reviewed: 12/12/2014 Elsevier Interactive Patient Education  2017 [employer].

## 2016-09-18 NOTE — Progress Notes (Signed)
Subjective:    Patient ID: Richard Roach, male    DOB: October 13, 1955, 61 y.o.   MRN: [id number]  Chief Complaint  Patient presents with  . Ear issues    right ear has been hard to ear out of x2 months    HPI:  Richard Roach is a 61 y.o. male who  has a past medical history of Allergic rhinitis; Anxiety state (05/02/2015); Chronic RLQ pain (05/02/2015); Colon polyps; Fatty liver; Hypercholesterolemia; Hypertension; Inguinal hernia; Internal hemorrhoids; Liver fibrosis (05/02/2015); PVC's (premature ventricular contractions); Serrated adenoma of colon; and Sinus bradycardia. and presents today for an acute office visit.   This is a new problem. Associated symptom of decreased hearing located in his right ear has been going on for about 2 months. No fevers, discharge or pain. There are no modifying factors or attempted treatments.    No Known Allergies    Outpatient Medications Prior to Visit  Medication Sig Dispense Refill  . aspirin 81 MG tablet Take 81 mg by mouth daily.    . metoprolol succinate (TOPROL-XL) 25 MG 24 hr tablet Take 1 tablet (25 mg total) by mouth daily. 90 tablet 3  . Multiple Vitamin (MULTIVITAMIN) tablet Take 1 tablet by mouth daily. Reported on 05/02/2015    . rosuvastatin (CRESTOR) 20 MG tablet Take 1 tablet (20 mg total) by mouth daily. 90 tablet 3  . aluminum chloride (DRYSOL) 20 % external solution Apply topically at bedtime.    . Ascorbic Acid (VITAMIN C) 1000 MG tablet Take 1,000 mg by mouth daily. Reported on 05/02/2015    . B Complex Vitamins (VITAMIN B-COMPLEX) TABS Take 1 tablet by mouth daily. Reported on 05/02/2015    . ibuprofen (ADVIL,MOTRIN) 200 MG tablet Take 200 mg by mouth every 6 (six) hours as needed. Reported on 05/02/2015    . Omega-3 Fatty Acids (FISH OIL PO) Take 2 capsules by mouth daily. Reported on 05/02/2015    . sildenafil (VIAGRA) 100 MG tablet Take 0.5-1 tablets (50-100 mg total) by mouth daily as needed for erectile dysfunction. 5 tablet 11  .  vitamin E 200 UNIT capsule Take 200 Units by mouth daily. Reported on 05/02/2015     No facility-administered medications prior to visit.      Review of Systems  Constitutional: Negative for chills and fever.  HENT: Positive for hearing loss. Negative for ear discharge, ear pain, sinus pain, sinus pressure, sore throat and tinnitus.       Objective:    BP 118/70 (BP Location: Left Arm, Patient Position: Sitting, Cuff Size: Large)   Pulse 74   Temp 98 F (36.7 C) (Oral)   Resp 16   Ht 5\' 11"  (1.803 m)   Wt 186 lb 6.4 oz (84.6 kg)   SpO2 96%   BMI 26.00 kg/m  Nursing note and vital signs reviewed.  Physical Exam  Constitutional: He is oriented to person, place, and time. He appears well-developed and well-nourished. No distress.  HENT:  Right Ear: External ear and ear canal normal. Decreased hearing is noted.  Left Ear: Hearing, tympanic membrane, external ear and ear canal normal.  Nose: Nose normal.  Mouth/Throat: Uvula is midline, oropharynx is clear and moist and mucous membranes are normal.  Impacted cerumen located in right ear.   Cardiovascular: Normal rate, regular rhythm, normal heart sounds and intact distal pulses.   Pulmonary/Chest: Effort normal and breath sounds normal.  Neurological: He is alert and oriented to person, place, and time.  Skin: Skin is  warm and dry.  Psychiatric: He has a normal mood and affect. His behavior is normal. Judgment and thought content normal.   Procedure: Cerumen removal of right ear  Verbal consent was obtained. The right ear was cleaned out with warm water mixed with Docusate Sodium. There was wax return and the tympanic membrane was visible. Procedure was tolerated well without complication.        Assessment & Plan:   Problem List Items Addressed This Visit      Nervous and Auditory   Impacted cerumen of right ear - Primary    Symptoms consistent with impacted cerumen of the right ear. Cerumen cleansed with improved  hearing. Preventative care discussed. Follow up as needed.           I have discontinued Mr. Ederer vitamin C, vitamin E, Vitamin B-Complex, Omega-3 Fatty Acids (FISH OIL PO), aluminum chloride, ibuprofen, and sildenafil. I am also having him maintain his multivitamin, aspirin, rosuvastatin, and metoprolol succinate.   Follow-up: Return if symptoms worsen or fail to improve.  Mauricio Po, FNP

## 2016-10-21 DIAGNOSIS — G8929 Other chronic pain: Secondary | ICD-10-CM | POA: Diagnosis not present

## 2016-10-21 DIAGNOSIS — E785 Hyperlipidemia, unspecified: Secondary | ICD-10-CM | POA: Diagnosis not present

## 2016-10-21 DIAGNOSIS — Z79899 Other long term (current) drug therapy: Secondary | ICD-10-CM | POA: Diagnosis not present

## 2016-10-21 DIAGNOSIS — R1010 Upper abdominal pain, unspecified: Secondary | ICD-10-CM | POA: Diagnosis not present

## 2016-10-21 DIAGNOSIS — F419 Anxiety disorder, unspecified: Secondary | ICD-10-CM | POA: Diagnosis not present

## 2016-10-21 DIAGNOSIS — R1031 Right lower quadrant pain: Secondary | ICD-10-CM | POA: Diagnosis not present

## 2016-10-21 DIAGNOSIS — K760 Fatty (change of) liver, not elsewhere classified: Secondary | ICD-10-CM | POA: Diagnosis not present

## 2016-12-24 DIAGNOSIS — N403 Nodular prostate with lower urinary tract symptoms: Secondary | ICD-10-CM | POA: Diagnosis not present

## 2016-12-25 DIAGNOSIS — R350 Frequency of micturition: Secondary | ICD-10-CM | POA: Diagnosis not present

## 2016-12-25 DIAGNOSIS — R351 Nocturia: Secondary | ICD-10-CM | POA: Diagnosis not present

## 2016-12-25 DIAGNOSIS — N403 Nodular prostate with lower urinary tract symptoms: Secondary | ICD-10-CM | POA: Diagnosis not present

## 2017-01-03 DIAGNOSIS — J069 Acute upper respiratory infection, unspecified: Secondary | ICD-10-CM | POA: Diagnosis not present

## 2017-01-03 DIAGNOSIS — H6121 Impacted cerumen, right ear: Secondary | ICD-10-CM | POA: Diagnosis not present

## 2017-01-03 DIAGNOSIS — R0981 Nasal congestion: Secondary | ICD-10-CM | POA: Diagnosis not present

## 2017-01-05 ENCOUNTER — Ambulatory Visit (INDEPENDENT_AMBULATORY_CARE_PROVIDER_SITE_OTHER): Payer: BLUE CROSS/BLUE SHIELD | Admitting: Family Medicine

## 2017-01-05 ENCOUNTER — Encounter: Payer: Self-pay | Admitting: Family Medicine

## 2017-01-05 VITALS — BP 130/86 | HR 67 | Temp 97.6°F | Ht 71.0 in | Wt 183.0 lb

## 2017-01-05 DIAGNOSIS — J069 Acute upper respiratory infection, unspecified: Secondary | ICD-10-CM | POA: Diagnosis not present

## 2017-01-05 MED ORDER — AZITHROMYCIN 250 MG PO TABS: ORAL_TABLET | ORAL | 0 refills | Status: DC

## 2017-01-05 NOTE — Patient Instructions (Addendum)
Thank you for coming in,   Please try things such as zyrtec-D or allegra-D which is an antihistamine and decongestant.   Please try afrin which will help with nasal congestion but use for only three days.   Please also try using a netti pot on a regular occasion.  Please try ibuprofen or tylenol for a headache   Please feel free to call with any questions or concerns at any time, at [phone]. --Dr. Raeford Razor

## 2017-01-05 NOTE — Progress Notes (Signed)
Richard Roach - 61 y.o. male MRN [id number]  Date of birth: [birth year]  SUBJECTIVE:  Including CC & ROS.  Chief Complaint  Patient presents with  . Cold    X1 week patient states he has had a sore throat head pressure and cough. patient went to an urgent care and was given fluticasone and Norel AD tabs and wants to make sure they are appropriate and he says he usually gets a Z pack    Richard Roach a 61 year old male is presenting with sinus pressure and congestion. He reports symptoms of been occurring since Friday. He has traveled recently. He denies any fevers or chills. Denies any teeth pain. Has not tried any over-the-counter medications. He has been seen at a different office and provided Flonase and Norel. He reports his symptoms are constant in nature. He reports he gets this about once a year and has taken azithromycin to help his symptoms. He has had sick contacts. He has had some mucus production.     Review of Systems  Constitutional: Negative for fever.  HENT: Positive for congestion and sinus pressure. Negative for dental problem.   Eyes: Negative for pain.  Respiratory: Negative for shortness of breath.   Gastrointestinal: Negative for nausea.  Otherwise negative  HISTORY: Past Medical, Surgical, Social, and Family History Reviewed & Updated per EMR.   Pertinent Historical Findings include:  Past Medical History:  Diagnosis Date  . Allergic rhinitis   . Anxiety state 05/02/2015  . Chronic RLQ pain 05/02/2015  . Colon polyps   . Fatty liver   . Hypercholesterolemia   . Hypertension   . Inguinal hernia   . Internal hemorrhoids   . Liver fibrosis 05/02/2015  . PVC's (premature ventricular contractions)   . Serrated adenoma of colon   . Sinus bradycardia     Past Surgical History:  Procedure Laterality Date  . KNEE ARTHROSCOPY Right June 2011  . KNEE ARTHROSCOPY Left 2012  . TONSILLECTOMY      No Known Allergies  Family History  Problem Relation Age of Onset  .  Asthma Father   . High blood pressure Mother   . Colon cancer Neg Hx   . Colon polyps Neg Hx   . Diabetes Neg Hx   . Kidney disease Neg Hx   . Esophageal cancer Neg Hx      Social History   Social History  . Marital status: Married    Spouse name: N/A  . Number of children: 2  . Years of education: N/A   Occupational History  . Leisure centre manager    Social History Main Topics  . Smoking status: Former Smoker    Types: Cigarettes    Quit date: 05/11/1998  . Smokeless tobacco: Never Used     Comment: smoked only socially x 5 yrs  . Alcohol use 8.4 oz/week    14 Glasses of wine per week  . Drug use: No  . Sexual activity: Yes   Other Topics Concern  . Not on file   Social History Narrative   He lives with his wife, he is a Tree surgeon, travel, desk job, no smoking.     PHYSICAL EXAM:  VS: BP 130/86 (BP Location: Left Arm, Patient Position: Sitting, Cuff Size: Normal)   Pulse 67   Temp 97.6 F (36.4 C) (Oral)   Ht 5\' 11"  (1.803 m)   Wt 183 lb (83 kg)   SpO2 97%   BMI 25.52 kg/m  Physical Exam Gen:  NAD, alert, cooperative with exam, ENT: normal lips, normal nasal mucosa, tympanic membranes clear and intact bilaterally, turbinates normal, oropharynx clear, no tonsillar exudates, no cervical lymphadenopathy, some tenderness to palpation over the frontal sinus. Eye: normal EOM, normal conjunctiva and lids CV:  no edema, +2 pedal pulses, regular rate and rhythm, S1-S2   Resp: no accessory muscle use, non-labored, no wheezes or crackles, clear to auscultation bilaterally Skin: no rashes, no areas of induration  Neuro: normal tone, normal sensation to touch Psych:  normal insight, alert and oriented MSK: Normal gait and normal strength      ASSESSMENT & PLAN:   Upper respiratory tract infection This appears to be viral in nature. He has had a for a few days. - Supportive care devised - Printedscription of azithromycin provided - If no improvement may  need to consider Augmentin.

## 2017-01-05 NOTE — Assessment & Plan Note (Signed)
This appears to be viral in nature. He has had a for a few days. - Supportive care devised - Printedscription of azithromycin provided - If no improvement may need to consider Augmentin.

## 2017-01-25 DIAGNOSIS — N401 Enlarged prostate with lower urinary tract symptoms: Secondary | ICD-10-CM | POA: Diagnosis not present

## 2017-01-25 DIAGNOSIS — N403 Nodular prostate with lower urinary tract symptoms: Secondary | ICD-10-CM | POA: Diagnosis not present

## 2017-01-25 DIAGNOSIS — R351 Nocturia: Secondary | ICD-10-CM | POA: Diagnosis not present

## 2017-03-01 ENCOUNTER — Ambulatory Visit (INDEPENDENT_AMBULATORY_CARE_PROVIDER_SITE_OTHER): Payer: BLUE CROSS/BLUE SHIELD | Admitting: Family Medicine

## 2017-03-01 ENCOUNTER — Encounter: Payer: Self-pay | Admitting: Family Medicine

## 2017-03-01 VITALS — BP 146/84 | HR 63 | Temp 97.7°F | Ht 71.0 in | Wt 184.0 lb

## 2017-03-01 DIAGNOSIS — J069 Acute upper respiratory infection, unspecified: Secondary | ICD-10-CM

## 2017-03-01 NOTE — Patient Instructions (Signed)
Thank you for coming in,   Please try things such as zyrtec-D or allegra-D which is an antihistamine and decongestant.   Please try afrin which will help with nasal congestion but use for only three days.   Please also try using a netti pot on a regular occasion.  Honey can help with a sore throat.      Please feel free to call with any questions or concerns at any time, at [phone]. --Dr. Schmitz  

## 2017-03-01 NOTE — Progress Notes (Signed)
Richard Roach - 61 y.o. male MRN [id number]  Date of birth: [birth year]  SUBJECTIVE:  Including CC & ROS.  Chief Complaint  Patient presents with  . Cough    since he was seen in August. Patient states he has been having sinus pain, and drainage for past couple of weeks. He has been taking mucinex and nyquil.    Mr. Richard Roach is a 61 year old male is presenting with sinus pressure. He feels like this pressure started on Thursday. Has had worsening of his symptoms. Has tried over-the-counter medications such as Mucinex. Has not had any temperature or teeth pain. Had similar symptoms when he is seen in August. He did not fill the prescription of azithromycin that was pertinent for him. He has had some sputum production that is nonbloody in nature.     Review of Systems  Constitutional: Negative for fever.  HENT: Positive for sinus pain and sinus pressure. Negative for ear pain.     HISTORY: Past Medical, Surgical, Social, and Family History Reviewed & Updated per EMR.   Pertinent Historical Findings include:  Past Medical History:  Diagnosis Date  . Allergic rhinitis   . Anxiety state 05/02/2015  . Chronic RLQ pain 05/02/2015  . Colon polyps   . Fatty liver   . Hypercholesterolemia   . Hypertension   . Inguinal hernia   . Internal hemorrhoids   . Liver fibrosis 05/02/2015  . PVC's (premature ventricular contractions)   . Serrated adenoma of colon   . Sinus bradycardia     Past Surgical History:  Procedure Laterality Date  . KNEE ARTHROSCOPY Right June 2011  . KNEE ARTHROSCOPY Left 2012  . TONSILLECTOMY      No Known Allergies  Family History  Problem Relation Age of Onset  . Asthma Father   . High blood pressure Mother   . Colon cancer Neg Hx   . Colon polyps Neg Hx   . Diabetes Neg Hx   . Kidney disease Neg Hx   . Esophageal cancer Neg Hx      Social History   Social History  . Marital status: Married    Spouse name: N/A  . Number of children: 2  . Years of  education: N/A   Occupational History  . Leisure centre manager    Social History Main Topics  . Smoking status: Former Smoker    Types: Cigarettes    Quit date: 05/11/1998  . Smokeless tobacco: Never Used     Comment: smoked only socially x 5 yrs  . Alcohol use 8.4 oz/week    14 Glasses of wine per week  . Drug use: No  . Sexual activity: Yes   Other Topics Concern  . Not on file   Social History Narrative   He lives with his wife, he is a Tree surgeon, travel, desk job, no smoking.     PHYSICAL EXAM:  VS: BP (!) 146/84 (BP Location: Left Arm, Patient Position: Sitting, Cuff Size: Normal)   Pulse 63   Temp 97.7 F (36.5 C) (Oral)   Ht 5\' 11"  (1.803 m)   Wt 184 lb (83.5 kg)   SpO2 98%   BMI 25.66 kg/m  Physical Exam Gen: NAD, alert, cooperative with exam, well-appearing ENT: normal lips, normal nasal mucosa,  Eye: normal EOM, normal conjunctiva and lids CV:  no edema, +2 pedal pulses , S1-S2  Resp: no accessory muscle use, non-labored, clear to auscultation bilaterally, no crackles or wheezes Skin: no rashes, no areas of  induration  Neuro: normal tone, normal sensation to touch Psych:  normal insight, alert and oriented MSK: normal gait,normal strength       ASSESSMENT & PLAN:   Upper respiratory tract infection Symptoms likely viral in nature. Did not fill the azithromycin prescription that he was provided during his last visit in August. - Counseled on supportive care. - Can use a printed prescription of azithromycin that he received in August if needed.

## 2017-03-01 NOTE — Assessment & Plan Note (Signed)
Symptoms likely viral in nature. Did not fill the azithromycin prescription that he was provided during his last visit in August. - Counseled on supportive care. - Can use a printed prescription of azithromycin that he received in August if needed.

## 2017-03-11 ENCOUNTER — Ambulatory Visit (INDEPENDENT_AMBULATORY_CARE_PROVIDER_SITE_OTHER): Payer: BLUE CROSS/BLUE SHIELD | Admitting: Internal Medicine

## 2017-03-11 ENCOUNTER — Encounter: Payer: Self-pay | Admitting: Internal Medicine

## 2017-03-11 ENCOUNTER — Ambulatory Visit (INDEPENDENT_AMBULATORY_CARE_PROVIDER_SITE_OTHER)
Admission: RE | Admit: 2017-03-11 | Discharge: 2017-03-11 | Disposition: A | Discharge status: Home / Self Care | Payer: BLUE CROSS/BLUE SHIELD | Source: Ambulatory Visit | Attending: Internal Medicine | Admitting: Internal Medicine

## 2017-03-11 ENCOUNTER — Telehealth: Payer: Self-pay | Admitting: Internal Medicine

## 2017-03-11 VITALS — BP 122/84 | HR 73 | Temp 98.2°F | Ht 71.0 in | Wt 182.0 lb

## 2017-03-11 DIAGNOSIS — R0989 Other specified symptoms and signs involving the circulatory and respiratory systems: Secondary | ICD-10-CM | POA: Insufficient documentation

## 2017-03-11 DIAGNOSIS — I10 Essential (primary) hypertension: Secondary | ICD-10-CM

## 2017-03-11 DIAGNOSIS — R05 Cough: Secondary | ICD-10-CM | POA: Diagnosis not present

## 2017-03-11 DIAGNOSIS — R059 Cough, unspecified: Secondary | ICD-10-CM | POA: Insufficient documentation

## 2017-03-11 DIAGNOSIS — R0689 Other abnormalities of breathing: Secondary | ICD-10-CM | POA: Insufficient documentation

## 2017-03-11 MED ORDER — HYDROCODONE-HOMATROPINE 5-1.5 MG/5ML PO SYRP: 5.0000 mL | ORAL_SOLUTION | Freq: Four times a day (QID) | ORAL | 0 refills | Status: AC | PRN

## 2017-03-11 MED ORDER — LEVOFLOXACIN 500 MG PO TABS: 500.0000 mg | ORAL_TABLET | Freq: Every day | ORAL | 0 refills | Status: AC

## 2017-03-11 MED ORDER — PREDNISONE 10 MG PO TABS: ORAL_TABLET | ORAL | 0 refills | Status: DC

## 2017-03-11 NOTE — Patient Instructions (Signed)
Please take all new medication as prescribed - the antibiotic, cough medicine if needed, and prednisone  Please continue all other medications as before, and refills have been done if requested.  Please have the pharmacy call with any other refills you may need.  Please keep your appointments with your specialists as you may have planned  Please go to the XRAY Department in the Basement (go straight as you get off the elevator) for the x-ray testing  You will be contacted by phone if any changes need to be made immediately.  Otherwise, you will receive a letter about your results with an explanation, but please check with MyChart first.  Please remember to sign up for MyChart if you have not done so, as this will be important to you in the future with finding out test results, communicating by private email, and scheduling acute appointments online when needed.

## 2017-03-11 NOTE — Progress Notes (Signed)
Subjective:    Patient ID: Richard Roach, male    DOB: 03-01-1956, 61 y.o.   MRN: [id number]  HPI  Here with acute onset mild to mod 2-3 days ST, HA, general weakness and malaise, with prod cough greenish sputum, but Pt denies chest pain, increased sob or doe, wheezing, orthopnea, PND, increased LE swelling, palpitations, dizziness or syncope. Was seen at [facility], zpack helped to start but now returned.  Has low energy, no ear pain but intermittent sinus congestion as well. Past Medical History:  Diagnosis Date  . Allergic rhinitis   . Anxiety state 05/02/2015  . Chronic RLQ pain 05/02/2015  . Colon polyps   . Fatty liver   . Hypercholesterolemia   . Hypertension   . Inguinal hernia   . Internal hemorrhoids   . Liver fibrosis 05/02/2015  . PVC's (premature ventricular contractions)   . Serrated adenoma of colon   . Sinus bradycardia    Past Surgical History:  Procedure Laterality Date  . KNEE ARTHROSCOPY Right June 2011  . KNEE ARTHROSCOPY Left 2012  . TONSILLECTOMY      reports that he quit smoking about 18 years ago. His smoking use included Cigarettes. He has never used smokeless tobacco. He reports that he drinks about 8.4 oz of alcohol per week . He reports that he does not use drugs. family history includes Asthma in his father; High blood pressure in his mother. No Known Allergies Current Outpatient Prescriptions on File Prior to Visit  Medication Sig Dispense Refill  . Ascorbic Acid (VITAMIN C) 1000 MG tablet Take 1,000 mg by mouth daily.    Marland Kitchen aspirin 81 MG tablet Take 81 mg by mouth daily.    Marland Kitchen b complex vitamins tablet Take 1 tablet by mouth daily.    . Echinacea 450 MG CAPS Take by mouth once.    . metoprolol succinate (TOPROL-XL) 25 MG 24 hr tablet Take 1 tablet (25 mg total) by mouth daily. 90 tablet 3  . Multiple Vitamin (MULTIVITAMIN) tablet Take 1 tablet by mouth daily. Reported on 05/02/2015    . rosuvastatin (CRESTOR) 20 MG tablet Take 1 tablet (20 mg total) by mouth  daily. 90 tablet 3   No current facility-administered medications on file prior to visit.    Review of Systems  Constitutional: Negative for other unusual diaphoresis or sweats HENT: Negative for ear discharge or swelling Eyes: Negative for other worsening visual disturbances Respiratory: Negative for stridor or other swelling  Gastrointestinal: Negative for worsening distension or other blood Genitourinary: Negative for retention or other urinary change Musculoskeletal: Negative for other MSK pain or swelling Skin: Negative for color change or other new lesions Neurological: Negative for worsening tremors and other numbness  Psychiatric/Behavioral: Negative for worsening agitation or other fatigue All other system neg per pt    Objective:   Physical Exam BP 122/84   Pulse 73   Temp 98.2 F (36.8 C) (Oral)   Ht 5\' 11"  (1.803 m)   Wt 182 lb (82.6 kg)   SpO2 98%   BMI 25.38 kg/m  VS noted, mild ill Constitutional: Pt appears in NAD HENT: Head: NCAT.  Right Ear: External ear normal.  Left Ear: External ear normal.  Eyes: . Pupils are equal, round, and reactive to light. Conjunctivae and EOM are normal Nose: without d/c or deformity Bilat tm's with mild erythema.  Max sinus areas non tender.  Pharynx with mild erythema, no exudate Neck: Neck supple. Gross normal ROM Cardiovascular: Normal rate and  regular rhythm.   Pulmonary/Chest: Effort normal and breath sounds with left sided courseness rhonchi without rales or wheezing.  Neurological: Pt is alert. At baseline orientation, motor grossly intact Skin: Skin is warm. No rashes, other new lesions, no LE edema Psychiatric: Pt behavior is normal without agitation  No other exam findings    Assessment & Plan:

## 2017-03-11 NOTE — Telephone Encounter (Signed)
Pt has been informed and expressed understanding.  

## 2017-03-11 NOTE — Telephone Encounter (Signed)
Pt called for his results from today 11/1 Please call back in regard

## 2017-03-11 NOTE — Telephone Encounter (Signed)
cxr was neg for acute  OK to continue same tx

## 2017-03-13 NOTE — Assessment & Plan Note (Signed)
stable overall by history and exam, recent data reviewed with pt, and pt to continue medical treatment as before,  to f/u any worsening symptoms or concerns BP Readings from Last 3 Encounters:  03/11/17 122/84  03/01/17 (!) 146/84  01/05/17 130/86

## 2017-03-13 NOTE — Assessment & Plan Note (Signed)
Mild to mod, c/w bronchitis vs pna, for antibx course, cough med prn,  to f/u any worsening symptoms or concerns 

## 2017-03-13 NOTE — Assessment & Plan Note (Signed)
[place] for mucinex, also cxr r/o pna

## 2017-04-18 ENCOUNTER — Emergency Department (HOSPITAL_COMMUNITY)
Admission: EM | Admit: 2017-04-18 | Discharge: 2017-04-18 | Disposition: A | Discharge status: Home / Self Care | Payer: BLUE CROSS/BLUE SHIELD | Attending: Emergency Medicine | Admitting: Emergency Medicine

## 2017-04-18 ENCOUNTER — Other Ambulatory Visit: Payer: Self-pay

## 2017-04-18 ENCOUNTER — Encounter (HOSPITAL_COMMUNITY): Payer: Self-pay | Admitting: *Deleted

## 2017-04-18 ENCOUNTER — Emergency Department (HOSPITAL_COMMUNITY): Payer: BLUE CROSS/BLUE SHIELD

## 2017-04-18 DIAGNOSIS — Z79899 Other long term (current) drug therapy: Secondary | ICD-10-CM | POA: Diagnosis not present

## 2017-04-18 DIAGNOSIS — Z87891 Personal history of nicotine dependence: Secondary | ICD-10-CM | POA: Insufficient documentation

## 2017-04-18 DIAGNOSIS — R072 Precordial pain: Secondary | ICD-10-CM | POA: Insufficient documentation

## 2017-04-18 DIAGNOSIS — I10 Essential (primary) hypertension: Secondary | ICD-10-CM | POA: Insufficient documentation

## 2017-04-18 DIAGNOSIS — R079 Chest pain, unspecified: Secondary | ICD-10-CM | POA: Diagnosis present

## 2017-04-18 LAB — COMPREHENSIVE METABOLIC PANEL
ALBUMIN: 3.9 g/dL (ref 3.5–5.0)
ALT: 19 U/L (ref 17–63)
AST: 23 U/L (ref 15–41)
Alkaline Phosphatase: 65 U/L (ref 38–126)
Anion gap: 11 (ref 5–15)
BUN: 22 mg/dL — AB (ref 6–20)
CHLORIDE: 104 mmol/L (ref 101–111)
CO2: 21 mmol/L — AB (ref 22–32)
CREATININE: 1.11 mg/dL (ref 0.61–1.24)
Calcium: 9.3 mg/dL (ref 8.9–10.3)
GFR calc Af Amer: >60 mL/min (ref >60)
Glucose, Bld: 129 mg/dL — ABNORMAL HIGH (ref 65–99)
POTASSIUM: 3.6 mmol/L (ref 3.5–5.1)
SODIUM: 136 mmol/L (ref 135–145)
Total Bilirubin: 0.4 mg/dL (ref 0.3–1.2)
Total Protein: 6.7 g/dL (ref 6.5–8.1)

## 2017-04-18 LAB — CBC
HEMATOCRIT: 45 % (ref 39.0–52.0)
HEMOGLOBIN: 16 g/dL (ref 13.0–17.0)
MCH: 31.3 pg (ref 26.0–34.0)
MCHC: 35.6 g/dL (ref 30.0–36.0)
MCV: 87.9 fL (ref 78.0–100.0)
Platelets: 212 10*3/uL (ref 150–400)
RBC: 5.12 MIL/uL (ref 4.22–5.81)
RDW: 12.9 % (ref 11.5–15.5)
WBC: 7.1 10*3/uL (ref 4.0–10.5)

## 2017-04-18 LAB — I-STAT TROPONIN, ED
TROPONIN I, POC: 0 ng/mL (ref 0.00–0.08)
TROPONIN I, POC: 0 ng/mL (ref 0.00–0.08)

## 2017-04-18 LAB — TROPONIN I

## 2017-04-18 MED ORDER — OMEPRAZOLE 20 MG PO CPDR
20.0000 mg | DELAYED_RELEASE_CAPSULE | Freq: Every day | ORAL | 0 refills | Status: DC
Start: 2017-04-18 — End: 2017-04-28

## 2017-04-18 NOTE — ED Notes (Signed)
Patient transported to X-ray 

## 2017-04-18 NOTE — ED Triage Notes (Signed)
To ED for eval of chest heaviness while sitting at computer. States this pain last approx 1 min. No pain, no nausea, no diaphoresis. States he has had heartburn lately and dizziness last Saturday that resolved. No pain or dizziness now.

## 2017-04-18 NOTE — ED Provider Notes (Signed)
[facility] EMERGENCY DEPARTMENT Provider Note   CSN: [id number] Arrival date & time: 04/18/17  1527     History   Chief Complaint Chief Complaint  Patient presents with  . Chest Pain    HPI Richard Roach is a 61 y.o. male.  The history is provided by the patient.   Patient is a 39-year-old male who presents to the emergency department with some transient chest heaviness without radiation that began while sitting at his computer.  He states the discomfort lasts approximately 1 minute.  Denies nausea and vomiting.  Denies diaphoresis.  Reports no discomfort or pain at this time.  States he has had several episodes of "heartburn" lately.  No exertional chest pain or exertional "heartburn".  No prior history of coronary artery disease.  He does have hypertension hypercholesterolemia.  His brother does have a history of coronary artery disease and is required stenting.  Patient has never had a heart catheterization or stress test.   Past Medical History:  Diagnosis Date  . Allergic rhinitis   . Anxiety state 05/02/2015  . Chronic RLQ pain 05/02/2015  . Colon polyps   . Fatty liver   . Hypercholesterolemia   . Hypertension   . Inguinal hernia   . Internal hemorrhoids   . Liver fibrosis 05/02/2015  . PVC's (premature ventricular contractions)   . Serrated adenoma of colon   . Sinus bradycardia     Patient Active Problem List   Diagnosis Date Noted  . Cough 03/11/2017  . Abnormal breath sounds 03/11/2017  . Upper respiratory tract infection 01/05/2017  . Erectile dysfunction 04/24/2016  . Anxiety state 05/02/2015  . Liver fibrosis 05/02/2015  . Chronic RLQ pain 05/02/2015  . BPH (benign prostatic hypertrophy) 05/02/2015  . Hypercholesterolemia   . Colon polyps   . Serrated adenoma of colon   . PVC's (premature ventricular contractions)   . Fatty liver 03/14/2014  . Encounter for well adult exam with abnormal findings 04/25/2013  . Palpitations 04/17/2013    . Impacted cerumen of right ear 09/24/2012  . Abdominal pain 12/06/2010  . HYPERHIDROSIS 04/30/2009  . HYPERCHOLESTEROLEMIA 09/23/2007  . Essential hypertension 06/09/2007  . Allergic rhinitis 06/09/2007    Past Surgical History:  Procedure Laterality Date  . KNEE ARTHROSCOPY Right June 2011  . KNEE ARTHROSCOPY Left 2012  . TONSILLECTOMY         Home Medications    Prior to Admission medications   Medication Sig Start Date End Date Taking? Authorizing Provider  Ascorbic Acid (VITAMIN C) 1000 MG tablet Take 1,000 mg by mouth daily.   Yes [provider]  aspirin 81 MG tablet Take 81 mg by mouth daily.   Yes [provider]  metoprolol succinate (TOPROL-XL) 25 MG 24 hr tablet Take 1 tablet (25 mg total) by mouth daily. 04/24/16  Yes Biagio Borg, MD  Multiple Vitamin (MULTIVITAMIN) tablet Take 1 tablet by mouth daily. Reported on 05/02/2015   Yes [provider]  rosuvastatin (CRESTOR) 20 MG tablet Take 1 tablet (20 mg total) by mouth daily. 04/24/16  Yes Biagio Borg, MD  predniSONE (DELTASONE) 10 MG tablet 2 tabs by mouth per day for 5 days Patient not taking: Reported on 04/18/2017 03/11/17   Biagio Borg, MD    Family History Family History  Problem Relation Age of Onset  . Asthma Father   . High blood pressure Mother   . Colon cancer Neg Hx   . Colon polyps Neg  Hx   . Diabetes Neg Hx   . Kidney disease Neg Hx   . Esophageal cancer Neg Hx     Social History Social History   Tobacco Use  . Smoking status: Former Smoker    Types: Cigarettes    Last attempt to quit: 05/11/1998    Years since quitting: 18.9  . Smokeless tobacco: Never Used  . Tobacco comment: smoked only socially x 5 yrs  Substance Use Topics  . Alcohol use: Yes    Alcohol/week: 8.4 oz    Types: 14 Glasses of wine per week  . Drug use: No     Allergies   Patient has no known allergies.   Review of Systems Review of Systems  All other systems reviewed and are  negative.    Physical Exam Updated Vital Signs BP (!) 149/89   Pulse 65   Resp 18   SpO2 99%   Physical Exam  Constitutional: He is oriented to person, place, and time. He appears well-developed and well-nourished.  HENT:  Head: Normocephalic and atraumatic.  Eyes: EOM are normal.  Neck: Normal range of motion.  Cardiovascular: Normal rate, regular rhythm, normal heart sounds and intact distal pulses.  Pulmonary/Chest: Effort normal and breath sounds normal. No respiratory distress.  Abdominal: Soft. He exhibits no distension. There is no tenderness.  Musculoskeletal: Normal range of motion.  Neurological: He is alert and oriented to person, place, and time.  Skin: Skin is warm and dry.  Psychiatric: He has a normal mood and affect. Judgment normal.  Nursing note and vitals reviewed.    ED Treatments / Results  Labs (all labs ordered are listed, but only abnormal results are displayed) Labs Reviewed  COMPREHENSIVE METABOLIC PANEL - Abnormal; Notable for the following components:      Result Value   CO2 21 (*)    Glucose, Bld 129 (*)    BUN 22 (*)    All other components within normal limits  CBC  TROPONIN I  I-STAT TROPONIN, ED  I-STAT TROPONIN, ED    EKG  EKG Interpretation #1   Date/Time:  Sunday April 18 2017 15:46:34 EST Ventricular Rate:  79 PR Interval:  164 QRS Duration: 99 QT Interval:  389 QTC Calculation: 446 R Axis:   53 Text Interpretation:  Sinus rhythm No significant change was found Confirmed by Jola Schmidt [phone]) on 04/18/2017 4:06:21 PM       EKG Interpretation #2  Date/Time:  Sunday April 18 2017 18:32:24 EST Ventricular Rate:  70 PR Interval:  164 QRS Duration: 93 QT Interval:  406 QTC Calculation: 439 R Axis:   39 Text Interpretation:  oSinus rhythm No significant change was found Confirmed by Jola Schmidt [phone]) on 04/18/2017 6:53:48 PM         Radiology Dg Chest 2 View  Result Date: 04/18/2017 CLINICAL DATA:   Heaviness and burning sensation in the sternal region. EXAM: CHEST  2 VIEW COMPARISON:  Two-view chest x-ray FINDINGS: Heart size is normal. Aortic atherosclerosis is again noted. The lungs are clear. The visualized soft tissues and bony thorax are unremarkable. IMPRESSION: No active cardiopulmonary disease. Electronically Signed   By: San Morelle M.D.   On: 04/18/2017 15:59    Procedures Procedures (including critical care time)  Medications Ordered in ED Medications - No data to display   Initial Impression / Assessment and Plan / ED Course  I have reviewed the triage vital signs and the nursing notes.  Pertinent labs & imaging  results that were available during my care of the patient were reviewed by me and considered in my medical decision making (see chart for details).     Troponin negative x2.  EKG without ischemic changes x2.  Discharged home in good condition.  Given his family history I think is reasonable to have outpatient cardiology follow-up.  I think the patient is safe for discharge from the ER  Final Clinical Impressions(s) / ED Diagnoses   Final diagnoses:  Precordial pain    ED Discharge Orders    None       Jola Schmidt, MD 04/25/17 [phone]

## 2017-04-21 DIAGNOSIS — E785 Hyperlipidemia, unspecified: Secondary | ICD-10-CM | POA: Diagnosis not present

## 2017-04-21 DIAGNOSIS — F419 Anxiety disorder, unspecified: Secondary | ICD-10-CM | POA: Diagnosis not present

## 2017-04-21 DIAGNOSIS — K7581 Nonalcoholic steatohepatitis (NASH): Secondary | ICD-10-CM | POA: Diagnosis not present

## 2017-04-21 DIAGNOSIS — K760 Fatty (change of) liver, not elsewhere classified: Secondary | ICD-10-CM | POA: Diagnosis not present

## 2017-04-22 ENCOUNTER — Encounter: Payer: Self-pay | Admitting: Cardiology

## 2017-04-22 ENCOUNTER — Ambulatory Visit: Payer: BLUE CROSS/BLUE SHIELD | Admitting: Cardiology

## 2017-04-22 VITALS — BP 114/68 | HR 72 | Ht 70.0 in | Wt 184.0 lb

## 2017-04-22 DIAGNOSIS — E7800 Pure hypercholesterolemia, unspecified: Secondary | ICD-10-CM | POA: Diagnosis not present

## 2017-04-22 DIAGNOSIS — R072 Precordial pain: Secondary | ICD-10-CM

## 2017-04-22 DIAGNOSIS — I10 Essential (primary) hypertension: Secondary | ICD-10-CM | POA: Diagnosis not present

## 2017-04-22 NOTE — Patient Instructions (Signed)
Medication Instructions:   NO CHANGE  Testing/Procedures:  Your physician has requested that you have an exercise tolerance test. For further information please visit [email or web address]. Please also follow instruction sheet, as given.    Follow-Up:  Your physician wants you to follow-up in: [place] will receive a reminder letter in the mail two months in advance. If you don't receive a letter, please call our office to schedule the follow-up appointment.   If you need a refill on your cardiac medications before your next appointment, please call your pharmacy.   Exercise Stress Electrocardiogram An exercise stress electrocardiogram is a test to check how blood flows to your heart. It is done to find areas of poor blood flow. You will need to walk on a treadmill for this test. The electrocardiogram will record your heartbeat when you are at rest and when you are exercising. What happens before the procedure?  Do not have drinks with caffeine or foods with caffeine for 24 hours before the test, or as told by your doctor. This includes coffee, tea (even decaf tea), sodas, chocolate, and cocoa.  Follow your doctor's instructions about eating and drinking before the test.  Ask your doctor what medicines you should or should not take before the test. Take your medicines with water unless told by your doctor not to.  If you use an inhaler, bring it with you to the test.  Bring a snack to eat after the test.  Do not  smoke for 4 hours before the test.  Do not put lotions, powders, creams, or oils on your chest before the test.  Wear comfortable shoes and clothing. What happens during the procedure?  You will have patches put on your chest. Small areas of your chest may need to be shaved. Wires will be connected to the patches.  Your heart rate will be watched while you are resting and while you are exercising.  You will walk on the treadmill. The treadmill will  slowly get faster to raise your heart rate.  The test will take about 1-2 hours. What happens after the procedure?  Your heart rate and blood pressure will be watched after the test.  You may return to your normal diet, activities, and medicines or as told by your doctor. This information is not intended to replace advice given to you by your health care provider. Make sure you discuss any questions you have with your health care provider. Document Released: 10/14/2007 Document Revised: 12/25/2015 Document Reviewed: 01/02/2013 Elsevier Interactive Patient Education  [employer].

## 2017-04-22 NOTE — Progress Notes (Signed)
HPI: follow-up chest pain and palpitations. Previously noted to have PVCs on telemetry with palpitations. Echocardiogram December 2014 showed normal LV function, grade 2 diastolic dysfunction and mild left atrial enlargement. Exercise treadmill in December of 2014 normal. Holter monitor July 2016 showed PVCs. Seen recently with chest pain and enzymes negative. Hemoglobin 16 and liver functions normal. Since last seen, patient states his chest pain recently was in the epigastric area. No radiation or associated symptoms. Pain was not pleuritic or positional. It lasted 1-1/2 minutes and resolved. Patient denies dyspnea on exertion or exertional chest pain. No syncope.  Current Outpatient Medications  Medication Sig Dispense Refill  . Ascorbic Acid (VITAMIN C) 1000 MG tablet Take 1,000 mg by mouth daily.    Marland Kitchen aspirin 81 MG tablet Take 81 mg by mouth daily.    . metoprolol succinate (TOPROL-XL) 25 MG 24 hr tablet Take 1 tablet (25 mg total) by mouth daily. 90 tablet 3  . Multiple Vitamin (MULTIVITAMIN) tablet Take 1 tablet by mouth daily. Reported on 05/02/2015    . omeprazole (PRILOSEC) 20 MG capsule Take 1 capsule (20 mg total) by mouth daily. 30 capsule 0  . rosuvastatin (CRESTOR) 20 MG tablet Take 1 tablet (20 mg total) by mouth daily. 90 tablet 3   No current facility-administered medications for this visit.      Past Medical History:  Diagnosis Date  . Allergic rhinitis   . Anxiety state 05/02/2015  . Chronic RLQ pain 05/02/2015  . Colon polyps   . Fatty liver   . Hypercholesterolemia   . Hypertension   . Inguinal hernia   . Internal hemorrhoids   . Liver fibrosis 05/02/2015  . PVC's (premature ventricular contractions)   . Serrated adenoma of colon   . Sinus bradycardia     Past Surgical History:  Procedure Laterality Date  . KNEE ARTHROSCOPY Right June 2011  . KNEE ARTHROSCOPY Left 2012  . TONSILLECTOMY      Social History   Socioeconomic History  . Marital  status: Married    Spouse name: Not on file  . Number of children: 2  . Years of education: Not on file  . Highest education level: Not on file  Social Needs  . Financial resource strain: Not on file  . Food insecurity - worry: Not on file  . Food insecurity - inability: Not on file  . Transportation needs - medical: Not on file  . Transportation needs - non-medical: Not on file  Occupational History  . Occupation: Leisure centre manager  Tobacco Use  . Smoking status: Former Smoker    Types: Cigarettes    Last attempt to quit: 05/11/1998    Years since quitting: 18.9  . Smokeless tobacco: Never Used  . Tobacco comment: smoked only socially x 5 yrs  Substance and Sexual Activity  . Alcohol use: Yes    Alcohol/week: 8.4 oz    Types: 14 Glasses of wine per week  . Drug use: No  . Sexual activity: Yes  Other Topics Concern  . 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    Family History  Problem Relation Age of Onset  . Asthma Father   . High blood pressure Mother   . Colon cancer Neg Hx   . Colon polyps Neg Hx   . Diabetes Neg Hx   . Kidney disease Neg Hx   .  Esophageal cancer Neg Hx     ROS: no fevers or chills, productive cough, hemoptysis, dysphasia, odynophagia, melena, hematochezia, dysuria, hematuria, rash, seizure activity, orthopnea, PND, pedal edema, claudication. Remaining systems are negative.  Physical Exam: Well-developed well-nourished in no acute distress.  Skin is warm and dry.  HEENT is normal.  Neck is supple.  Chest is clear to auscultation with normal expansion.  Cardiovascular exam is regular rate and rhythm.  Abdominal exam nontender or distended. No masses palpated. Extremities show no edema. neuro grossly intact  ECG-04/18/2017-sinus rhythm with no ST changes. personally reviewed  A/P  1 chest pain-symptoms are atypical. I will arrange an exercise treadmill for risk  stratification.  2 hypertension-blood pressure controlled. Continue present medications.  3 hyperlipidemia-continue statin.  4 history of palpitations-symptoms are controlled. Continue beta blocker.  Kirk Ruths, MD

## 2017-04-28 ENCOUNTER — Ambulatory Visit (INDEPENDENT_AMBULATORY_CARE_PROVIDER_SITE_OTHER): Payer: BLUE CROSS/BLUE SHIELD | Admitting: Internal Medicine

## 2017-04-28 ENCOUNTER — Inpatient Hospital Stay (HOSPITAL_COMMUNITY): Admission: RE | Admit: 2017-04-28 | Payer: BLUE CROSS/BLUE SHIELD | Source: Ambulatory Visit

## 2017-04-28 ENCOUNTER — Encounter: Payer: Self-pay | Admitting: Internal Medicine

## 2017-04-28 VITALS — BP 122/80 | HR 61 | Temp 97.9°F | Ht 70.0 in | Wt 185.0 lb

## 2017-04-28 DIAGNOSIS — Z0000 Encounter for general adult medical examination without abnormal findings: Secondary | ICD-10-CM

## 2017-04-28 DIAGNOSIS — R079 Chest pain, unspecified: Secondary | ICD-10-CM | POA: Insufficient documentation

## 2017-04-28 DIAGNOSIS — R7303 Prediabetes: Secondary | ICD-10-CM | POA: Insufficient documentation

## 2017-04-28 DIAGNOSIS — R739 Hyperglycemia, unspecified: Secondary | ICD-10-CM | POA: Insufficient documentation

## 2017-04-28 DIAGNOSIS — Z114 Encounter for screening for human immunodeficiency virus [HIV]: Secondary | ICD-10-CM | POA: Diagnosis not present

## 2017-04-28 MED ORDER — ROSUVASTATIN CALCIUM 20 MG PO TABS: 20.0000 mg | ORAL_TABLET | Freq: Every day | ORAL | 3 refills | Status: DC

## 2017-04-28 MED ORDER — METOPROLOL SUCCINATE ER 25 MG PO TB24
25.0000 mg | ORAL_TABLET | Freq: Every day | ORAL | 3 refills | Status: DC
Start: 2017-04-28 — End: 2018-07-25

## 2017-04-28 MED ORDER — ZOSTER VAC RECOMB ADJUVANTED 50 MCG/0.5ML IM SUSR: 0.5000 mL | Freq: Once | INTRAMUSCULAR | 1 refills | Status: AC

## 2017-04-28 NOTE — Progress Notes (Signed)
Subjective:    Patient ID: Richard Roach, male    DOB: 1955/09/05, 61 y.o.   MRN: [id number]  HPI  Here for wellness and f/u;  Overall doing ok;  Pt denies further CP, worsening SOB, DOE, wheezing, orthopnea, PND, worsening LE edema, palpitations, dizziness or syncope.  Pt denies neurological change such as new headache, facial or extremity weakness.  Pt denies polydipsia, polyuria, or low sugar symptoms. Pt states overall good compliance with treatment and medications, good tolerability, and has been trying to follow appropriate diet.  Pt denies worsening depressive symptoms, suicidal ideation or panic. No fever, night sweats, wt loss, loss of appetite, or other constitutional symptoms.  Pt states good ability with ADL's, has low fall risk, home safety reviewed and adequate, no other significant changes in hearing or vision, and fairly active with exercise with going to the gym several times per wk.  Not taking the crestor consistently, plans to start.  Has seen cardiology dec 13 - for stress test dec 27. Past Medical History:  Diagnosis Date  . Allergic rhinitis   . Anxiety state 05/02/2015  . Chronic RLQ pain 05/02/2015  . Colon polyps   . Fatty liver   . Hypercholesterolemia   . Hypertension   . Inguinal hernia   . Internal hemorrhoids   . Liver fibrosis 05/02/2015  . PVC's (premature ventricular contractions)   . Serrated adenoma of colon   . Sinus bradycardia    Past Surgical History:  Procedure Laterality Date  . KNEE ARTHROSCOPY Right June 2011  . KNEE ARTHROSCOPY Left 2012  . TONSILLECTOMY      reports that he quit smoking about 18 years ago. His smoking use included cigarettes. he has never used smokeless tobacco. He reports that he drinks about 8.4 oz of alcohol per week. He reports that he does not use drugs. family history includes Asthma in his father; High blood pressure in his mother. No Known Allergies Current Outpatient Medications on File Prior to Visit  Medication Sig  Dispense Refill  . Ascorbic Acid (VITAMIN C) 1000 MG tablet Take 1,000 mg by mouth daily.    Marland Kitchen aspirin 81 MG tablet Take 81 mg by mouth daily.    . Multiple Vitamin (MULTIVITAMIN) tablet Take 1 tablet by mouth daily. Reported on 05/02/2015     No current facility-administered medications on file prior to visit.    Review of Systems Constitutional: Negative for other unusual diaphoresis, sweats, appetite or weight changes HENT: Negative for other worsening hearing loss, ear pain, facial swelling, mouth sores or neck stiffness.   Eyes: Negative for other worsening pain, redness or other visual disturbance.  Respiratory: Negative for other stridor or swelling Cardiovascular: Negative for other palpitations or other chest pain  Gastrointestinal: Negative for worsening diarrhea or loose stools, blood in stool, distention or other pain Genitourinary: Negative for hematuria, flank pain or other change in urine volume.  Musculoskeletal: Negative for myalgias or other joint swelling.  Skin: Negative for other color change, or other wound or worsening drainage.  Neurological: Negative for other syncope or numbness. Hematological: Negative for other adenopathy or swelling Psychiatric/Behavioral: Negative for hallucinations, other worsening agitation, SI, self-injury, or new decreased concentration All other system neg per pt    Objective:   Physical Exam BP 122/80   Pulse 61   Temp 97.9 F (36.6 C) (Oral)   Ht 5\' 10"  (1.778 m)   Wt 185 lb (83.9 kg)   SpO2 100%   BMI 26.54 kg/m  VS noted,  Constitutional: Pt is oriented to person, place, and time. Appears well-developed and well-nourished, in no significant distress and comfortable Head: Normocephalic and atraumatic  Eyes: Conjunctivae and EOM are normal. Pupils are equal, round, and reactive to light Right Ear: External ear normal without discharge Left Ear: External ear normal without discharge Nose: Nose without discharge or  deformity Mouth/Throat: Oropharynx is without other ulcerations and moist  Neck: Normal range of motion. Neck supple. No JVD present. No tracheal deviation present or significant neck LA or mass Cardiovascular: Normal rate, regular rhythm, normal heart sounds and intact distal pulses.   Pulmonary/Chest: WOB normal and breath sounds without rales or wheezing  Abdominal: Soft. Bowel sounds are normal. NT. No HSM  Musculoskeletal: Normal range of motion. Exhibits no edema Lymphadenopathy: Has no other cervical adenopathy.  Neurological: Pt is alert and oriented to person, place, and time. Pt has normal reflexes. No cranial nerve deficit. Motor grossly intact, Gait intact Skin: Skin is warm and dry. No rash noted or new ulcerations Psychiatric:  Has normal mood and affect. Behavior is normal without agitation No other exam findings Lab Results  Component Value Date   WBC 7.1 04/18/2017   HGB 16.0 04/18/2017   HCT 45.0 04/18/2017   PLT 212 04/18/2017   GLUCOSE 129 (H) 04/18/2017   CHOL 204 (H) 04/24/2016   TRIG 60.0 04/24/2016   HDL 62.70 04/24/2016   LDLDIRECT 164.0 04/18/2012   LDLCALC 129 (H) 04/24/2016   ALT 19 04/18/2017   AST 23 04/18/2017   NA 136 04/18/2017   K 3.6 04/18/2017   CL 104 04/18/2017   CREATININE 1.11 04/18/2017   BUN 22 (H) 04/18/2017   CO2 21 (L) 04/18/2017   TSH 1.31 04/24/2016   PSA 1.69 04/24/2016   HGBA1C 5.6 04/27/2014       Assessment & Plan:

## 2017-04-28 NOTE — Patient Instructions (Signed)
Your shingles shot was sent to the Pharmacy (but remember to ask about the cost)  You will be contacted regarding the referral for: colonoscopy for May 2019  Please continue all other medications as before, and refills have been done if requested.  Please have the pharmacy call with any other refills you may need.  Please continue your efforts at being more active, low cholesterol diet, and weight control.  You are otherwise up to date with prevention measures today.  Please keep your appointments with your specialists as you may have planned - the stress test on Dec 27  Please go to the LAB in the Basement (turn left off the elevator) for the tests to be done today  You will be contacted by phone if any changes need to be made immediately.  Otherwise, you will receive a letter about your results with an explanation, but please check with MyChart first.  Please remember to sign up for MyChart if you have not done so, as this will be important to you in the future with finding out test results, communicating by private email, and scheduling acute appointments online when needed.  Please return in 1 year for your yearly visit, or sooner if needed, with Lab testing done 3-5 days before

## 2017-04-30 ENCOUNTER — Telehealth (HOSPITAL_COMMUNITY): Payer: Self-pay

## 2017-04-30 NOTE — Telephone Encounter (Signed)
Encounter complete. 

## 2017-05-01 ENCOUNTER — Encounter: Payer: Self-pay | Admitting: Internal Medicine

## 2017-05-01 NOTE — Assessment & Plan Note (Signed)

## 2017-05-01 NOTE — Assessment & Plan Note (Signed)
Atypical, ? esoph spasm like, for stress test as planned

## 2017-05-01 NOTE — Assessment & Plan Note (Signed)
stable overall by history and exam, recent data reviewed with pt, and pt to continue medical treatment as before,  to f/u any worsening symptoms or concerns Lab Results  Component Value Date   HGBA1C 5.6 04/27/2014

## 2017-05-06 ENCOUNTER — Telehealth (HOSPITAL_COMMUNITY): Payer: Self-pay

## 2017-05-06 ENCOUNTER — Inpatient Hospital Stay (HOSPITAL_COMMUNITY): Admission: RE | Admit: 2017-05-06 | Payer: BLUE CROSS/BLUE SHIELD | Source: Ambulatory Visit

## 2017-05-06 NOTE — Telephone Encounter (Signed)
No call made. I did reach pt yesterday. Pt did RBV instructions yesterday.  Encounter complete.

## 2017-05-07 ENCOUNTER — Other Ambulatory Visit (INDEPENDENT_AMBULATORY_CARE_PROVIDER_SITE_OTHER): Payer: BLUE CROSS/BLUE SHIELD

## 2017-05-07 ENCOUNTER — Ambulatory Visit (HOSPITAL_COMMUNITY)
Admission: RE | Admit: 2017-05-07 | Discharge: 2017-05-07 | Disposition: A | Discharge status: Home / Self Care | Payer: BLUE CROSS/BLUE SHIELD | Source: Ambulatory Visit | Attending: Cardiovascular Disease | Admitting: Cardiovascular Disease

## 2017-05-07 DIAGNOSIS — Z0000 Encounter for general adult medical examination without abnormal findings: Secondary | ICD-10-CM | POA: Diagnosis not present

## 2017-05-07 DIAGNOSIS — R739 Hyperglycemia, unspecified: Secondary | ICD-10-CM

## 2017-05-07 DIAGNOSIS — Z114 Encounter for screening for human immunodeficiency virus [HIV]: Secondary | ICD-10-CM | POA: Diagnosis not present

## 2017-05-07 DIAGNOSIS — Z0001 Encounter for general adult medical examination with abnormal findings: Secondary | ICD-10-CM

## 2017-05-07 DIAGNOSIS — R072 Precordial pain: Secondary | ICD-10-CM | POA: Diagnosis not present

## 2017-05-07 LAB — CBC WITH DIFFERENTIAL/PLATELET
BASOS ABS: 0 10*3/uL (ref 0.0–0.1)
BASOS PCT: 0.5 % (ref 0.0–3.0)
Eosinophils Absolute: 0.2 10*3/uL (ref 0.0–0.7)
Eosinophils Relative: 3.2 % (ref 0.0–5.0)
HEMATOCRIT: 46.7 % (ref 39.0–52.0)
Hemoglobin: 15.7 g/dL (ref 13.0–17.0)
LYMPHS ABS: 1.5 10*3/uL (ref 0.7–4.0)
LYMPHS PCT: 26.8 % (ref 12.0–46.0)
MCHC: 33.7 g/dL (ref 30.0–36.0)
MCV: 91.8 fl (ref 78.0–100.0)
MONOS PCT: 8.1 % (ref 3.0–12.0)
Monocytes Absolute: 0.5 10*3/uL (ref 0.1–1.0)
NEUTROS ABS: 3.4 10*3/uL (ref 1.4–7.7)
NEUTROS PCT: 61.4 % (ref 43.0–77.0)
PLATELETS: 232 10*3/uL (ref 150.0–400.0)
RBC: 5.08 Mil/uL (ref 4.22–5.81)
RDW: 13.3 % (ref 11.5–15.5)
WBC: 5.6 10*3/uL (ref 4.0–10.5)

## 2017-05-07 LAB — URINALYSIS, ROUTINE W REFLEX MICROSCOPIC
Bilirubin Urine: NEGATIVE
Hgb urine dipstick: NEGATIVE
KETONES UR: NEGATIVE
Leukocytes, UA: NEGATIVE
Nitrite: NEGATIVE
PH: 7.5 (ref 5.0–8.0)
RBC / HPF: NONE SEEN (ref 0–?)
SPECIFIC GRAVITY, URINE: 1.015 (ref 1.000–1.030)
Total Protein, Urine: NEGATIVE
Urine Glucose: NEGATIVE
Urobilinogen, UA: 0.2 (ref 0.0–1.0)
WBC UA: NONE SEEN (ref 0–?)

## 2017-05-07 LAB — HEMOGLOBIN A1C: HEMOGLOBIN A1C: 5.7 % (ref 4.6–6.5)

## 2017-05-07 LAB — EXERCISE TOLERANCE TEST
CSEPED: 12 min
CSEPEW: 14.2 METS
CSEPHR: 92 %
CSEPPHR: 148 {beats}/min
Exercise duration (sec): 26 s
MPHR: 160 {beats}/min
RPE: 18
Rest HR: 58 {beats}/min

## 2017-05-07 LAB — HEPATIC FUNCTION PANEL
ALBUMIN: 4.1 g/dL (ref 3.5–5.2)
ALK PHOS: 54 U/L (ref 39–117)
ALT: 19 U/L (ref 0–53)
AST: 18 U/L (ref 0–37)
Bilirubin, Direct: 0.1 mg/dL (ref 0.0–0.3)
Total Bilirubin: 0.6 mg/dL (ref 0.2–1.2)
Total Protein: 6.4 g/dL (ref 6.0–8.3)

## 2017-05-07 LAB — LIPID PANEL
CHOL/HDL RATIO: 3
Cholesterol: 150 mg/dL (ref 0–200)
HDL: 49.8 mg/dL (ref >39.00)
LDL Cholesterol: 88 mg/dL (ref 0–99)
NONHDL: 100.49
TRIGLYCERIDES: 64 mg/dL (ref 0.0–149.0)
VLDL: 12.8 mg/dL (ref 0.0–40.0)

## 2017-05-07 LAB — BASIC METABOLIC PANEL
BUN: 20 mg/dL (ref 6–23)
CALCIUM: 9 mg/dL (ref 8.4–10.5)
CO2: 27 meq/L (ref 19–32)
CREATININE: 1.01 mg/dL (ref 0.40–1.50)
Chloride: 106 mEq/L (ref 96–112)
GFR: 80.06 mL/min (ref >60.00)
GLUCOSE: 96 mg/dL (ref 70–99)
Potassium: 4.4 mEq/L (ref 3.5–5.1)
Sodium: 140 mEq/L (ref 135–145)

## 2017-05-07 LAB — PSA: PSA: 1.81 ng/mL (ref 0.10–4.00)

## 2017-05-07 LAB — TSH: TSH: 2.03 u[IU]/mL (ref 0.35–4.50)

## 2017-05-08 LAB — HIV ANTIBODY (ROUTINE TESTING W REFLEX): HIV: NONREACTIVE

## 2017-06-04 DIAGNOSIS — H35033 Hypertensive retinopathy, bilateral: Secondary | ICD-10-CM | POA: Diagnosis not present

## 2017-06-04 DIAGNOSIS — H43811 Vitreous degeneration, right eye: Secondary | ICD-10-CM | POA: Diagnosis not present

## 2017-06-04 DIAGNOSIS — H2513 Age-related nuclear cataract, bilateral: Secondary | ICD-10-CM | POA: Diagnosis not present

## 2017-06-04 DIAGNOSIS — H43822 Vitreomacular adhesion, left eye: Secondary | ICD-10-CM | POA: Diagnosis not present

## 2017-07-19 ENCOUNTER — Encounter: Payer: Self-pay | Admitting: Gastroenterology

## 2017-07-30 DIAGNOSIS — M7741 Metatarsalgia, right foot: Secondary | ICD-10-CM | POA: Diagnosis not present

## 2017-07-30 DIAGNOSIS — M79671 Pain in right foot: Secondary | ICD-10-CM | POA: Diagnosis not present

## 2017-08-04 ENCOUNTER — Other Ambulatory Visit: Payer: Self-pay | Admitting: Internal Medicine

## 2017-09-03 ENCOUNTER — Encounter (HOSPITAL_BASED_OUTPATIENT_CLINIC_OR_DEPARTMENT_OTHER): Payer: Self-pay

## 2017-09-03 ENCOUNTER — Other Ambulatory Visit: Payer: Self-pay

## 2017-09-03 ENCOUNTER — Emergency Department (HOSPITAL_BASED_OUTPATIENT_CLINIC_OR_DEPARTMENT_OTHER)
Admission: EM | Admit: 2017-09-03 | Discharge: 2017-09-03 | Disposition: A | Discharge status: Home / Self Care | Payer: BLUE CROSS/BLUE SHIELD | Attending: Emergency Medicine | Admitting: Emergency Medicine

## 2017-09-03 DIAGNOSIS — R1032 Left lower quadrant pain: Secondary | ICD-10-CM | POA: Insufficient documentation

## 2017-09-03 DIAGNOSIS — I10 Essential (primary) hypertension: Secondary | ICD-10-CM | POA: Insufficient documentation

## 2017-09-03 DIAGNOSIS — R1030 Lower abdominal pain, unspecified: Secondary | ICD-10-CM | POA: Diagnosis not present

## 2017-09-03 DIAGNOSIS — F419 Anxiety disorder, unspecified: Secondary | ICD-10-CM | POA: Insufficient documentation

## 2017-09-03 DIAGNOSIS — N4889 Other specified disorders of penis: Secondary | ICD-10-CM | POA: Diagnosis present

## 2017-09-03 DIAGNOSIS — Z87891 Personal history of nicotine dependence: Secondary | ICD-10-CM | POA: Insufficient documentation

## 2017-09-03 MED ORDER — SULFAMETHOXAZOLE-TRIMETHOPRIM 800-160 MG PO TABS: 1.0000 | ORAL_TABLET | Freq: Two times a day (BID) | ORAL | 0 refills | Status: AC

## 2017-09-03 NOTE — Discharge Instructions (Signed)
You were seen in the ED today with an area of small infection in a hair follicle. We are starting an antibiotic. Apply warm compress to the area and call the Urologist if symptoms do not improve after antibiotics. Return to the ED with any fever, chills, or sudden worsening symptoms.

## 2017-09-03 NOTE — ED Triage Notes (Signed)
C/o left groin swelling x today-NAD-steady gait

## 2017-09-03 NOTE — ED Provider Notes (Signed)
Emergency Department Provider Note   I have reviewed the triage vital signs and the nursing notes.   HISTORY  Chief Complaint Groin Swelling   HPI Richard Roach is a 62 y.o. male presents to the ED for evaluation of a small, painful, slightly red area at the base of the penis. No ulceration or discharge. No fever/chills. Symptoms began this AM. No modifying factors. No dysuria, hesitancy, or urgency. Patient shaved his pubic hair after feeling a painful mass but does not do so regularly. No prior lesions in the past. No concern for STD exposure. No testicle pain/swelling. No radiation of symptoms.   Past Medical History:  Diagnosis Date  . Allergic rhinitis   . Anxiety state 05/02/2015  . Chronic RLQ pain 05/02/2015  . Colon polyps   . Fatty liver   . Hypercholesterolemia   . Hypertension   . Inguinal hernia   . Internal hemorrhoids   . Liver fibrosis 05/02/2015  . PVC's (premature ventricular contractions)   . Serrated adenoma of colon   . Sinus bradycardia     Patient Active Problem List   Diagnosis Date Noted  . Hyperglycemia 04/28/2017  . Chest pain 04/28/2017  . Cough 03/11/2017  . Abnormal breath sounds 03/11/2017  . Upper respiratory tract infection 01/05/2017  . Erectile dysfunction 04/24/2016  . Anxiety state 05/02/2015  . Liver fibrosis 05/02/2015  . Chronic RLQ pain 05/02/2015  . BPH (benign prostatic hypertrophy) 05/02/2015  . Hypercholesterolemia   . Colon polyps   . Serrated adenoma of colon   . PVC's (premature ventricular contractions)   . Fatty liver 03/14/2014  . Preventative health care 04/25/2013  . Palpitations 04/17/2013  . Impacted cerumen of right ear 09/24/2012  . Abdominal pain 12/06/2010  . HYPERHIDROSIS 04/30/2009  . HYPERCHOLESTEROLEMIA 09/23/2007  . Essential hypertension 06/09/2007  . Allergic rhinitis 06/09/2007    Past Surgical History:  Procedure Laterality Date  . KNEE ARTHROSCOPY Right June 2011  . KNEE ARTHROSCOPY Left  2012  . TONSILLECTOMY      Current Outpatient Rx  . Order #: [id number] Class: Historical Med  . Order #: [id number] Class: Normal  . Order #: [id number] Class: Normal  . Order #: [id number] Class: Normal  . Order #: [id number] Class: Historical Med  . Order #: [id number] Class: Historical Med  . Order #: [id number] Class: Print    Allergies Patient has no known allergies.  Family History  Problem Relation Age of Onset  . Asthma Father   . High blood pressure Mother   . Colon cancer Neg Hx   . Colon polyps Neg Hx   . Diabetes Neg Hx   . Kidney disease Neg Hx   . Esophageal cancer Neg Hx     Social History Social History   Tobacco Use  . Smoking status: Former Smoker    Types: Cigarettes    Last attempt to quit: 05/11/1998    Years since quitting: 19.3  . Smokeless tobacco: Never Used  . Tobacco comment: smoked only socially x 5 yrs  Substance Use Topics  . Alcohol use: Yes    Alcohol/week: 8.4 oz    Types: 14 Glasses of wine per week  . Drug use: No    Review of Systems  Constitutional: No fever/chills Eyes: No visual changes. ENT: No sore throat. Cardiovascular: Denies chest pain. Respiratory: Denies shortness of breath. Gastrointestinal: No abdominal pain. No nausea, no vomiting.  No diarrhea.  No constipation. Genitourinary: Negative for dysuria. Positive painful bump at the base of the penis.  Musculoskeletal: Negative for back pain. Skin: Negative for rash. Neurological: Negative for headaches, focal weakness or numbness.  10-point ROS otherwise negative.  ____________________________________________   PHYSICAL EXAM:  VITAL SIGNS: ED Triage Vitals  Enc Vitals Group     BP 09/03/17 1848 (!) 136/101     Pulse Rate 09/03/17 1848 70     Resp 09/03/17 1848 18     Temp 09/03/17 1848 97.9 F (36.6 C)     Temp Source 09/03/17 1848 Oral     SpO2 09/03/17 1848 100 %     Weight 09/03/17 1849 182 lb (82.6 kg)     Height 09/03/17 1849 5\' 10"  (1.778 m)     Pain Score  09/03/17 1847 0   Constitutional: Alert and oriented. Well appearing and in no acute distress. Eyes: Conjunctivae are normal.  Head: Atraumatic. Nose: No congestion/rhinnorhea. Mouth/Throat: Mucous membranes are moist.   Neck: No stridor.   Cardiovascular: Normal rate, regular rhythm. Good peripheral circulation. Grossly normal heart sounds.   Respiratory: Normal respiratory effort.  No retractions. Lungs CTAB. Gastrointestinal: Soft and nontender. No distention.  Genitourinary: 1 cm tender, slightly erythematous mass slightly left of midline toward the base of the penis. No fluctuance. Mass is mobile. No cellulitis extending outward. No inguinal hernia appreciated. No testicle tenderness to palpation.  Musculoskeletal: No lower extremity tenderness nor edema. No gross deformities of extremities. Neurologic:  Normal speech and language. No gross focal neurologic deficits are appreciated.  Skin:  Skin is warm, dry and intact. No rash noted.  ____________________________________________  RADIOLOGY  None ____________________________________________   PROCEDURES  Procedure(s) performed:   Procedures  EMERGENCY DEPARTMENT US SOFT TISSUE INTERPRETATION "Study: Limited Soft Tissue Ultrasound"  INDICATIONS: Pain and Soft tissue infection Multiple views of the body part were obtained in real-time with a multi-frequency linear probe PERFORMED BY:  Myself SIDE:Left and Midline BODY PART:Pelvic wall FINDINGS: No abcess noted INTERPRETATION:  No abcess noted   CPT:  Pelvic wall 65465-03  ____________________________________________   INITIAL IMPRESSION / ASSESSMENT AND PLAN / ED COURSE  Pertinent labs & imaging results that were available during my care of the patient were reviewed by me and considered in my medical decision making (see chart for details).  Patient presents to the ED with mild redness/swelling at the base of the penis. Negative bedside US. No evidence of  hernia. Plan for bactrim with possible developing hair follicle infection vs reactive lymph node. No concern for developing fournier's. Plan for bactrim and warm compress. Provided contact information for Urology for follow up if symptoms do not resolve. No indication at this time for CT abdomen/pelvis.    At this time, I do not feel there is any life-threatening condition present. I have reviewed and discussed all results (EKG, imaging, lab, urine as appropriate), exam findings with patient. I have reviewed nursing notes and appropriate previous records.  I feel the patient is safe to be discharged home without further emergent workup. Discussed usual and customary return precautions. Patient and family (if present) verbalize understanding and are comfortable with this plan.  Patient will follow-up with their primary care provider. If they do not have a primary care provider, information for follow-up has been provided to them. All questions have been answered.  ____________________________________________  FINAL CLINICAL IMPRESSION(S) / ED DIAGNOSES  Final diagnoses:  Left inguinal pain    NEW OUTPATIENT MEDICATIONS STARTED DURING THIS VISIT:  Discharge Medication List as of 09/03/2017  8:20 PM    START taking these medications  Details  sulfamethoxazole-trimethoprim (BACTRIM DS,SEPTRA DS) 800-160 MG tablet Take 1 tablet by mouth 2 (two) times daily for 7 days., Starting Fri 09/03/2017, Until Fri 09/10/2017, Print        Note:  This document was prepared using Dragon voice recognition software and may include unintentional dictation errors.  Nanda Quinton, MD Emergency Medicine    Philander Ake, [facility], MD 09/04/17 [phone]

## 2017-09-03 NOTE — ED Notes (Signed)
Lump to base of the shaft of the penis. Lump is red, tender to touch and a size of a dime.

## 2017-09-03 NOTE — ED Notes (Signed)
ED Provider at bedside. 

## 2017-09-06 ENCOUNTER — Other Ambulatory Visit: Payer: Self-pay

## 2017-09-06 ENCOUNTER — Ambulatory Visit (AMBULATORY_SURGERY_CENTER): Payer: Self-pay

## 2017-09-06 VITALS — Ht 70.0 in | Wt 186.0 lb

## 2017-09-06 DIAGNOSIS — Z860100 Personal history of colon polyps, unspecified: Secondary | ICD-10-CM

## 2017-09-06 DIAGNOSIS — R591 Generalized enlarged lymph nodes: Secondary | ICD-10-CM | POA: Diagnosis not present

## 2017-09-06 MED ORDER — PEG 3350-KCL-NA BICARB-NACL 420 G PO SOLR: 4000.0000 mL | Freq: Once | ORAL | 0 refills | Status: AC

## 2017-09-06 NOTE — Progress Notes (Signed)
Denies allergies to eggs or soy products. Denies complication of anesthesia or sedation. Denies use of weight loss medication. Denies use of O2.   Emmi instructions declined.  

## 2017-09-09 ENCOUNTER — Other Ambulatory Visit (INDEPENDENT_AMBULATORY_CARE_PROVIDER_SITE_OTHER): Payer: BLUE CROSS/BLUE SHIELD

## 2017-09-09 ENCOUNTER — Encounter: Payer: Self-pay | Admitting: Internal Medicine

## 2017-09-09 ENCOUNTER — Ambulatory Visit: Payer: BLUE CROSS/BLUE SHIELD | Admitting: Internal Medicine

## 2017-09-09 VITALS — BP 130/86 | HR 57 | Temp 97.7°F | Ht 70.0 in | Wt 183.0 lb

## 2017-09-09 DIAGNOSIS — I10 Essential (primary) hypertension: Secondary | ICD-10-CM | POA: Diagnosis not present

## 2017-09-09 DIAGNOSIS — L03324 Acute lymphangitis of groin: Secondary | ICD-10-CM

## 2017-09-09 DIAGNOSIS — R739 Hyperglycemia, unspecified: Secondary | ICD-10-CM | POA: Diagnosis not present

## 2017-09-09 DIAGNOSIS — R1909 Other intra-abdominal and pelvic swelling, mass and lump: Secondary | ICD-10-CM | POA: Diagnosis not present

## 2017-09-09 LAB — BASIC METABOLIC PANEL
BUN: 18 mg/dL (ref 6–23)
CO2: 28 mEq/L (ref 19–32)
CREATININE: 1.27 mg/dL (ref 0.40–1.50)
Calcium: 9.4 mg/dL (ref 8.4–10.5)
Chloride: 102 mEq/L (ref 96–112)
GFR: 61.39 mL/min (ref >60.00)
Glucose, Bld: 108 mg/dL — ABNORMAL HIGH (ref 70–99)
Potassium: 4 mEq/L (ref 3.5–5.1)
Sodium: 137 mEq/L (ref 135–145)

## 2017-09-09 LAB — CBC WITH DIFFERENTIAL/PLATELET
Basophils Absolute: 0 10*3/uL (ref 0.0–0.1)
Basophils Relative: 0.5 % (ref 0.0–3.0)
EOS ABS: 0.2 10*3/uL (ref 0.0–0.7)
Eosinophils Relative: 2.8 % (ref 0.0–5.0)
HEMATOCRIT: 45.1 % (ref 39.0–52.0)
Hemoglobin: 15.7 g/dL (ref 13.0–17.0)
LYMPHS ABS: 2.1 10*3/uL (ref 0.7–4.0)
LYMPHS PCT: 32.5 % (ref 12.0–46.0)
MCHC: 34.8 g/dL (ref 30.0–36.0)
MCV: 89.2 fl (ref 78.0–100.0)
MONOS PCT: 10.7 % (ref 3.0–12.0)
Monocytes Absolute: 0.7 10*3/uL (ref 0.1–1.0)
NEUTROS ABS: 3.5 10*3/uL (ref 1.4–7.7)
Neutrophils Relative %: 53.5 % (ref 43.0–77.0)
PLATELETS: 235 10*3/uL (ref 150.0–400.0)
RBC: 5.06 Mil/uL (ref 4.22–5.81)
RDW: 13.1 % (ref 11.5–15.5)
WBC: 6.5 10*3/uL (ref 4.0–10.5)

## 2017-09-09 MED ORDER — DOXYCYCLINE HYCLATE 100 MG PO TABS: 100.0000 mg | ORAL_TABLET | Freq: Two times a day (BID) | ORAL | 0 refills | Status: DC

## 2017-09-09 NOTE — Assessment & Plan Note (Signed)
Etiology unclear, for change septra to doxy course, for labs today - cbc, bmp, and CT abd/pelvis r/o malignancy,  to f/u any worsening symptoms or concerns

## 2017-09-09 NOTE — Progress Notes (Signed)
Subjective:    Patient ID: Richard Roach, male    DOB: January 25, 1956, 62 y.o.   MRN: [id number]  HPI  Here to f/u after was traveling 7 days ago and felt a new onset lump and tightness to left groin area, mild, some tender, but firm and not reducible, constant persistent, not really worse or better with anything.  Did go to an UC 4/26 as he thought might have a hernia, but was felt to more lymph node enlargement per provider.  Tx with  Septra x 7 days and has had good compliance, has only 2 pills left but unfortunately nothing has changed, in fact lump seems to be slightly larger and maybe even 2 of them.  Has c/o fatigue, but appetite ok and no recent wt loss.  Denies urinary symptoms such as dysuria, frequency, urgency, flank pain, hematuria or n/v, fever, chills.  Denies worsening reflux, abd pain, dysphagia, n/v, bowel change or blood.  He is concerned about malignancy since not getting better and brother has prostate ca.  Pt denies polydipsia, polyuria,   Incidentally has scheduled a colonoscopy for Mon may 6 and plans to return to travel for work may 8 Past Medical History:  Diagnosis Date  . Allergic rhinitis   . Anxiety state 05/02/2015  . Chronic RLQ pain 05/02/2015  . Colon polyps   . Fatty liver   . GERD (gastroesophageal reflux disease)   . Hypercholesterolemia   . Hypertension   . Inguinal hernia   . Internal hemorrhoids   . Liver fibrosis 05/02/2015  . PVC's (premature ventricular contractions)   . Serrated adenoma of colon   . Sinus bradycardia    Past Surgical History:  Procedure Laterality Date  . KNEE ARTHROSCOPY Right June 2011  . KNEE ARTHROSCOPY Left 2012  . TONSILLECTOMY      reports that he quit smoking about 19 years ago. His smoking use included cigarettes. He has never used smokeless tobacco. He reports that he drinks about 8.4 oz of alcohol per week. He reports that he does not use drugs. family history includes Asthma in his father; High blood pressure in his  mother. No Known Allergies Current Outpatient Medications on File Prior to Visit  Medication Sig Dispense Refill  . aspirin 81 MG tablet Take 81 mg by mouth daily.    . metoprolol succinate (TOPROL-XL) 25 MG 24 hr tablet Take 1 tablet (25 mg total) by mouth daily. 90 tablet 3  . rosuvastatin (CRESTOR) 20 MG tablet Take 1 tablet (20 mg total) by mouth daily. 90 tablet 3  . sildenafil (VIAGRA) 100 MG tablet TAKE 1/2-1 TABLET BY MOUTH EVERY DAY AS NEEDED FOR ERECTILE DYSFUNCTION 5 tablet 0  . sulfamethoxazole-trimethoprim (BACTRIM DS,SEPTRA DS) 800-160 MG tablet Take 1 tablet by mouth 2 (two) times daily for 7 days. 14 tablet 0   No current facility-administered medications on file prior to visit.    Review of Systems  Constitutional: Negative for other unusual diaphoresis or sweats HENT: Negative for ear discharge or swelling Eyes: Negative for other worsening visual disturbances Respiratory: Negative for stridor or other swelling  Gastrointestinal: Negative for worsening distension or other blood Genitourinary: Negative for retention or other urinary change Musculoskeletal: Negative for other MSK pain or swelling Skin: Negative for color change or other new lesions Neurological: Negative for worsening tremors and other numbness  Psychiatric/Behavioral: Negative for worsening agitation or other fatigue All other system neg per pt    Objective:   Physical Exam BP 130/86  Pulse (!) 57   Temp 97.7 F (36.5 C) (Oral)   Ht 5\' 10"  (1.778 m)   Wt 183 lb (83 kg)   SpO2 97%   BMI 26.26 kg/m  VS noted,  Constitutional: Pt appears in NAD HENT: Head: NCAT.  Right Ear: External ear normal.  Left Ear: External ear normal.  Eyes: . Pupils are equal, round, and reactive to light. Conjunctivae and EOM are normal Nose: without d/c or deformity Neck: Neck supple. Gross normal ROM Cardiovascular: Normal rate and regular rhythm.   Pulmonary/Chest: Effort normal and breath sounds without rales  or wheezing.  Abd:  Soft, NT, ND, + BS, no organomegaly Left groin with medial aspect just > 1 cm estimated firm subq mass somewhat mobile, mild tender without overlying skin change, other groin rash or lymphadenopathy or mass, no evidence for hernia GU: o/w normal external genitalia Neurological: Pt is alert. At baseline orientation, motor grossly intact Skin: Skin is warm. No rashes, other new lesions, no LE edema Psychiatric: Pt behavior is normal without agitation  No other exam findings    Assessment & Plan:

## 2017-09-09 NOTE — Patient Instructions (Addendum)
OK to stop the sulfa antibiotic  Please take all new medication as prescribed - the doxycycline antibiotic  You will be contacted regarding the referral for: CT scan - for tomorrow  Please continue all other medications as before, and refills have been done if requested.  Please have the pharmacy call with any other refills you may need.  Please keep your appointments with your specialists as you may have planned  Please go to the LAB in the Basement (turn left off the elevator) for the tests to be done today  You will be contacted by phone if any changes need to be made immediately.  Otherwise, you will receive a letter about your results with an explanation, but please check with MyChart first.  Please remember to sign up for MyChart if you have not done so, as this will be important to you in the future with finding out test results, communicating by private email, and scheduling acute appointments online when needed.

## 2017-09-09 NOTE — Assessment & Plan Note (Signed)
Asympt, for f/u glc today,  to f/u any worsening symptoms or concerns

## 2017-09-09 NOTE — Assessment & Plan Note (Signed)
stable overall by history and exam, recent data reviewed with pt, and pt to continue medical treatment as before,  to f/u any worsening symptoms or concerns  

## 2017-09-10 ENCOUNTER — Telehealth: Payer: Self-pay | Admitting: Internal Medicine

## 2017-09-10 ENCOUNTER — Encounter: Payer: Self-pay | Admitting: Internal Medicine

## 2017-09-10 ENCOUNTER — Ambulatory Visit (INDEPENDENT_AMBULATORY_CARE_PROVIDER_SITE_OTHER)
Admission: RE | Admit: 2017-09-10 | Discharge: 2017-09-10 | Disposition: A | Discharge status: Home / Self Care | Payer: BLUE CROSS/BLUE SHIELD | Source: Ambulatory Visit | Attending: Internal Medicine | Admitting: Internal Medicine

## 2017-09-10 DIAGNOSIS — R1909 Other intra-abdominal and pelvic swelling, mass and lump: Secondary | ICD-10-CM | POA: Diagnosis not present

## 2017-09-10 DIAGNOSIS — L03324 Acute lymphangitis of groin: Secondary | ICD-10-CM | POA: Diagnosis not present

## 2017-09-10 DIAGNOSIS — R1032 Left lower quadrant pain: Secondary | ICD-10-CM

## 2017-09-10 MED ORDER — IOPAMIDOL (ISOVUE-300) INJECTION 61%
100.0000 mL | Freq: Once | INTRAVENOUS | Status: AC | PRN
  Administered 2017-09-10: 100 mL via INTRAVENOUS

## 2017-09-10 NOTE — Telephone Encounter (Signed)
I didn't seen any hernia found on the CT but I can refer

## 2017-09-10 NOTE — Telephone Encounter (Signed)
Copied from [place] [id number]. Topic: Referral - Request >> Sep 10, 2017  4:11 PM Margot Ables wrote: Reason for CRM: pt called regarding mychart result notes on CT. He would like to proceed with referral to general surgery for hernia.

## 2017-09-10 NOTE — Telephone Encounter (Signed)
See below

## 2017-09-13 ENCOUNTER — Ambulatory Visit (AMBULATORY_SURGERY_CENTER): Payer: BLUE CROSS/BLUE SHIELD | Admitting: Gastroenterology

## 2017-09-13 ENCOUNTER — Encounter: Payer: Self-pay | Admitting: Gastroenterology

## 2017-09-13 ENCOUNTER — Other Ambulatory Visit: Payer: Self-pay

## 2017-09-13 VITALS — BP 107/69 | HR 56 | Temp 97.8°F | Resp 15 | Ht 70.0 in | Wt 186.0 lb

## 2017-09-13 DIAGNOSIS — Z86010 Personal history of colonic polyps: Secondary | ICD-10-CM | POA: Diagnosis not present

## 2017-09-13 MED ORDER — SODIUM CHLORIDE 0.9 % IV SOLN: 500.0000 mL | Freq: Once | INTRAVENOUS | Status: DC

## 2017-09-13 NOTE — Progress Notes (Signed)
Report given to PACU, vss 

## 2017-09-13 NOTE — Op Note (Signed)
[facility] Patient Name: Richard Roach Procedure Date: 09/13/2017 8:21 AM MRN: [id number] Endoscopist: Ladene Artist , MD Age: 62 Referring MD:  Date of Birth: 1956-01-02 Gender: Male Account #: [id number] Procedure:                Colonoscopy Indications:              High risk colon cancer surveillance: Personal                            history of traditional serrated adenoma of the colon Medicines:                Monitored Anesthesia Care Procedure:                Pre-Anesthesia Assessment:                           - Prior to the procedure, a History and Physical                            was performed, and patient medications and                            allergies were reviewed. The patient's tolerance of                            previous anesthesia was also reviewed. The risks                            and benefits of the procedure and the sedation                            options and risks were discussed with the patient.                            All questions were answered, and informed consent                            was obtained. Prior Anticoagulants: The patient has                            taken no previous anticoagulant or antiplatelet                            agents. ASA Grade Assessment: II - A patient with                            mild systemic disease. After reviewing the risks                            and benefits, the patient was deemed in                            satisfactory condition to undergo the procedure.  After obtaining informed consent, the colonoscope                            was passed under direct vision. Throughout the                            procedure, the patient's blood pressure, pulse, and                            oxygen saturations were monitored continuously. The                            Colonoscope was introduced through the anus and                            advanced to the the  cecum, identified by                            appendiceal orifice and ileocecal valve. The                            ileocecal valve, appendiceal orifice, and rectum                            were photographed. The quality of the bowel                            preparation was excellent. The colonoscopy was                            performed without difficulty. The patient tolerated                            the procedure well. Scope In: 8:29:29 AM Scope Out: 8:41:47 AM Scope Withdrawal Time: 0 hours 10 minutes 33 seconds  Total Procedure Duration: 0 hours 12 minutes 18 seconds  Findings:                 The perianal and digital rectal examinations were                            normal.                           Internal hemorrhoids were found during                            retroflexion. The hemorrhoids were small and Grade                            I (internal hemorrhoids that do not prolapse).                           The exam was otherwise without abnormality on  direct and retroflexion views. Complications:            No immediate complications. Estimated blood loss:                            None. Estimated Blood Loss:     Estimated blood loss: none. Impression:               - Internal hemorrhoids.                           - The examination was otherwise normal on direct                            and retroflexion views.                           - No specimens collected. Recommendation:           - Repeat colonoscopy in 5 years for surveillance.                           - Patient has a contact number available for                            emergencies. The signs and symptoms of potential                            delayed complications were discussed with the                            patient. Return to normal activities tomorrow.                            Written discharge instructions were provided to the                             patient.                           - Resume previous diet.                           - Continue present medications. Ladene Artist, MD 09/13/2017 8:44:26 AM This report has been signed electronically.

## 2017-09-13 NOTE — Progress Notes (Signed)
Pt's states no medical or surgical changes since previsit or office visit.Pt's states no medical or surgical changes since previsit or office visit. 

## 2017-09-13 NOTE — Patient Instructions (Signed)
YOU HAD AN ENDOSCOPIC PROCEDURE TODAY AT [facility]:   Refer to the procedure report that was given to you for any specific questions about what was found during the examination.  If the procedure report does not answer your questions, please call your gastroenterologist to clarify.  If you requested that your care partner not be given the details of your procedure findings, then the procedure report has been included in a sealed envelope for you to review at your convenience later.  YOU SHOULD EXPECT: Some feelings of bloating in the abdomen. Passage of more gas than usual.  Walking can help get rid of the air that was put into your GI tract during the procedure and reduce the bloating. If you had a lower endoscopy (such as a colonoscopy or flexible sigmoidoscopy) you may notice spotting of blood in your stool or on the toilet paper. If you underwent a bowel prep for your procedure, you may not have a normal bowel movement for a few days.  Please Note:  You might notice some irritation and congestion in your nose or some drainage.  This is from the oxygen used during your procedure.  There is no need for concern and it should clear up in a day or so.  SYMPTOMS TO REPORT IMMEDIATELY:   Following lower endoscopy (colonoscopy or flexible sigmoidoscopy):  Excessive amounts of blood in the stool  Significant tenderness or worsening of abdominal pains  Swelling of the abdomen that is new, acute  Fever of 100F or higher   For urgent or emergent issues, a gastroenterologist can be reached at any hour by calling [phone].   DIET:  We do recommend a small meal at first, but then you may proceed to your regular diet.  Drink plenty of fluids but you should avoid alcoholic beverages for 24 hours.  ACTIVITY:  You should plan to take it easy for the rest of today and you should NOT DRIVE or use heavy machinery until tomorrow (because of the sedation medicines used during the test).     FOLLOW UP: Our staff will call the number listed on your records the next business day following your procedure to check on you and address any questions or concerns that you may have regarding the information given to you following your procedure. If we do not reach you, we will leave a message.  However, if you are feeling well and you are not experiencing any problems, there is no need to return our call.  We will assume that you have returned to your regular daily activities without incident.  If any biopsies were taken you will be contacted by phone or by letter within the next 1-3 weeks.  Please call us at [phone] if you have not heard about the biopsies in 3 weeks.    SIGNATURES/CONFIDENTIALITY: You and/or your care partner have signed paperwork which will be entered into your electronic medical record.  These signatures attest to the fact that that the information above on your After Visit Summary has been reviewed and is understood.  Full responsibility of the confidentiality of this discharge information lies with you and/or your care-partner.    Handout was given to your care partner on hemorrhoids. You may resume your current medications today. Repeat colonoscopy in 5 years for surveillance. Please call if any questions or concerns.

## 2017-09-13 NOTE — Progress Notes (Signed)
No problems noted in the recovery room. maw 

## 2017-09-14 ENCOUNTER — Telehealth: Payer: Self-pay

## 2017-09-14 NOTE — Telephone Encounter (Signed)
  Follow up Call-  Call back number 09/13/2017  Post procedure Call Back phone  # [phone]  Permission to leave phone message Yes  Some recent data might be hidden     Patient questions:  Do you have a fever, pain , or abdominal swelling? No. Pain Score  0 *  Have you tolerated food without any problems? Yes.    Have you been able to return to your normal activities? Yes.    Do you have any questions about your discharge instructions: Diet   No. Medications  No. Follow up visit  No.  Do you have questions or concerns about your Care? No.  Actions: * If pain score is 4 or above: No action needed, pain <4. Patient states he has a slight headache

## 2017-09-15 ENCOUNTER — Telehealth: Payer: Self-pay | Admitting: Internal Medicine

## 2017-09-15 NOTE — Telephone Encounter (Signed)
Copied from [facility] [id number]. Topic: Quick Communication - See Telephone Encounter >> Sep 15, 2017  9:32 AM Rutherford Nail, NT wrote: CRM for notification. See Telephone encounter for: 09/15/17. Patient calling and wants to know if he can get 2 copies of his CT scan to take to his urologist and [facility] for possible hernia surgery today? Please advise. CB#: [phone]

## 2017-09-16 DIAGNOSIS — N430 Encysted hydrocele: Secondary | ICD-10-CM | POA: Diagnosis not present

## 2017-09-16 DIAGNOSIS — K4090 Unilateral inguinal hernia, without obstruction or gangrene, not specified as recurrent: Secondary | ICD-10-CM | POA: Diagnosis not present

## 2017-09-16 DIAGNOSIS — N400 Enlarged prostate without lower urinary tract symptoms: Secondary | ICD-10-CM | POA: Diagnosis not present

## 2017-09-22 ENCOUNTER — Encounter: Payer: Self-pay | Admitting: Internal Medicine

## 2017-10-20 DIAGNOSIS — R1031 Right lower quadrant pain: Secondary | ICD-10-CM | POA: Diagnosis not present

## 2017-10-20 DIAGNOSIS — E785 Hyperlipidemia, unspecified: Secondary | ICD-10-CM | POA: Diagnosis not present

## 2017-10-20 DIAGNOSIS — F419 Anxiety disorder, unspecified: Secondary | ICD-10-CM | POA: Diagnosis not present

## 2017-10-20 DIAGNOSIS — K760 Fatty (change of) liver, not elsewhere classified: Secondary | ICD-10-CM | POA: Diagnosis not present

## 2017-10-20 DIAGNOSIS — R1010 Upper abdominal pain, unspecified: Secondary | ICD-10-CM | POA: Diagnosis not present

## 2017-10-20 DIAGNOSIS — G8929 Other chronic pain: Secondary | ICD-10-CM | POA: Diagnosis not present

## 2017-11-09 DIAGNOSIS — Z8042 Family history of malignant neoplasm of prostate: Secondary | ICD-10-CM | POA: Diagnosis not present

## 2017-11-09 DIAGNOSIS — R351 Nocturia: Secondary | ICD-10-CM | POA: Diagnosis not present

## 2017-11-09 DIAGNOSIS — K4030 Unilateral inguinal hernia, with obstruction, without gangrene, not specified as recurrent: Secondary | ICD-10-CM | POA: Diagnosis not present

## 2017-11-19 DIAGNOSIS — J019 Acute sinusitis, unspecified: Secondary | ICD-10-CM | POA: Diagnosis not present

## 2017-11-19 DIAGNOSIS — J00 Acute nasopharyngitis [common cold]: Secondary | ICD-10-CM | POA: Diagnosis not present

## 2017-12-13 ENCOUNTER — Ambulatory Visit: Payer: Self-pay | Admitting: *Deleted

## 2017-12-13 NOTE — Telephone Encounter (Addendum)
Patient called with complaints of shortness of breath with deep inspiration; he also reports falling off a ladder 4 days ago; he states that he feel about 5 feet; there is pain right side of chest (breast and rib area); the area is sore and tender to touch; he also reports having a cold and productive cough (yellow phelgm) for 3 weeks but that is getting better; the pt says that he took a couple of motrin for a couple of days and applied ice to the area with no relief; recommendations made per nurse triage protocol to include seeing a physician within 3 days; he states that he is out of the area, and request an appointment with Dr Jenny Reichmann tomorrow afternoon; the pt offered and accepted appointment with Dr Cathlean Cower, [facility]; 12/14/17 at 1600; he verbalizes understanding; will route to office for notification of this upcoming appointment.  Reason for Disposition . [1] After 72 hours AND [2] chest pain not improving  Answer Assessment - Initial Assessment Questions 1. MECHANISM: "How did the injury happen?"   Pt fell off ladder 2. ONSET: "When did the injury happen?" (Minutes or hours ago)   12/09/17 3. LOCATION: "Where on the chest is the injury located?"    Right chest/breast, rib area 4. APPEARANCE: "What does the injury look like?"     No bruising noted 5. BLEEDING: "Is there any bleeding now? If so, ask: How long has it been bleeding?"     no 6. SEVERITY: "Any difficulty with breathing?"     shorntness of breath with deep inspiration 7. SIZE: For cuts, bruises, or swelling, ask: "How large is it?" (e.g., inches or centimeters)     no 8. PAIN: "Is there pain?" If so, ask: "How bad is the pain?"   (e.g., Scale 1-10; or mild, moderate, severe)     Rated 4 out of 10; hurts when he rolls over at night 9. TETANUS: For any breaks in the skin, ask: "When was the last tetanus booster?"     n/a 10. PREGNANCY: "Is there any chance you are pregnant?" "When was your last menstrual period?"      n/a  Protocols used: CHEST INJURY-A-AH

## 2017-12-14 ENCOUNTER — Ambulatory Visit (INDEPENDENT_AMBULATORY_CARE_PROVIDER_SITE_OTHER)
Admission: RE | Admit: 2017-12-14 | Discharge: 2017-12-14 | Disposition: A | Discharge status: Home / Self Care | Payer: BLUE CROSS/BLUE SHIELD | Source: Ambulatory Visit | Attending: Internal Medicine | Admitting: Internal Medicine

## 2017-12-14 ENCOUNTER — Encounter: Payer: Self-pay | Admitting: Internal Medicine

## 2017-12-14 ENCOUNTER — Ambulatory Visit: Payer: BLUE CROSS/BLUE SHIELD | Admitting: Internal Medicine

## 2017-12-14 VITALS — BP 118/64 | HR 57 | Temp 98.0°F | Ht 70.0 in | Wt 184.0 lb

## 2017-12-14 DIAGNOSIS — R079 Chest pain, unspecified: Secondary | ICD-10-CM

## 2017-12-14 DIAGNOSIS — R739 Hyperglycemia, unspecified: Secondary | ICD-10-CM

## 2017-12-14 DIAGNOSIS — I10 Essential (primary) hypertension: Secondary | ICD-10-CM

## 2017-12-14 DIAGNOSIS — S2231XA Fracture of one rib, right side, initial encounter for closed fracture: Secondary | ICD-10-CM | POA: Diagnosis not present

## 2017-12-14 NOTE — Assessment & Plan Note (Signed)
stable overall by history and exam, recent data reviewed with pt, and pt to continue medical treatment as before,  to f/u any worsening symptoms or concerns  

## 2017-12-14 NOTE — Patient Instructions (Signed)
Please continue all other medications as before, and refills have been done if requested.  Please have the pharmacy call with any other refills you may need.  Please keep your appointments with your specialists as you may have planned  Please go to the XRAY Department in the Basement (go straight as you get off the elevator) for the x-ray testing  You will be contacted by phone if any changes need to be made immediately.  Otherwise, you will receive a letter about your results with an explanation, but please check with MyChart first.  Please remember to sign up for MyChart if you have not done so, as this will be important to you in the future with finding out test results, communicating by private email, and scheduling acute appointments online when needed.  

## 2017-12-14 NOTE — Assessment & Plan Note (Signed)
stable overall by history and exam, recent data reviewed with pt, and pt to continue medical treatment as before,  to f/u any worsening symptoms or concerns Lab Results  Component Value Date   HGBA1C 5.7 05/07/2017

## 2017-12-14 NOTE — Progress Notes (Signed)
Subjective:    Patient ID: Richard Roach, male    DOB: [birth year], 62 y.o.   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  Pt denies other chest pain, orthopnea, PND, increased LE swelling, palpitations, dizziness or syncope.  Did have mild URI symptoms the week before but resolved Past Medical History:  Diagnosis Date  . Allergic rhinitis   . Anxiety state 05/02/2015  . Chronic RLQ pain 05/02/2015  . Colon polyps   . Fatty liver   . GERD (gastroesophageal reflux disease)   . Hypercholesterolemia   . Hypertension   . Inguinal hernia   . Internal hemorrhoids   . Liver fibrosis 05/02/2015  . PVC's (premature ventricular contractions)   . Serrated adenoma of colon   . Sinus bradycardia    Past Surgical History:  Procedure Laterality Date  . KNEE ARTHROSCOPY Right June 2011  . KNEE ARTHROSCOPY Left 2012  . TONSILLECTOMY      reports that he quit smoking about 19 years ago. His smoking use included cigarettes. He has never used smokeless tobacco. He reports that he drinks about 8.4 oz of alcohol per week. He reports that he does not use drugs. family history includes Asthma in his father; High blood pressure in his mother. No Known Allergies Current Outpatient Medications on File Prior to Visit  Medication Sig Dispense Refill  . doxycycline (VIBRA-TABS) 100 MG tablet Take 1 tablet (100 mg total) by mouth 2 (two) times daily. 20 tablet 0  . metoprolol succinate (TOPROL-XL) 25 MG 24 hr tablet Take 1 tablet (25 mg total) by mouth daily. 90 tablet 3  . rosuvastatin (CRESTOR) 20 MG tablet Take 1 tablet (20 mg total) by mouth daily. 90 tablet 3  . sildenafil (VIAGRA)  100 MG tablet TAKE 1/2-1 TABLET BY MOUTH EVERY DAY AS NEEDED FOR ERECTILE DYSFUNCTION 5 tablet 0   Current Facility-Administered Medications on File Prior to Visit  Medication Dose Route Frequency Provider Last Rate Last Dose  . 0.9 %  sodium chloride infusion  500 mL Intravenous Once Ladene Artist, MD       Review of Systems  Constitutional: Negative for other unusual diaphoresis or sweats HENT: Negative for ear discharge or swelling Eyes: Negative for other worsening visual disturbances Respiratory: Negative for stridor or other swelling  Gastrointestinal: Negative for worsening distension or other blood Genitourinary: Negative for retention or other urinary change Musculoskeletal: Negative for other MSK pain or swelling Skin: Negative for color change or other new lesions Neurological: Negative for worsening tremors and other numbness  Psychiatric/Behavioral: Negative for worsening agitation or other fatigue All other system neg per pt    Objective:   Physical Exam BP 118/64   Pulse (!) 57   Temp 98 F (36.7 C) (Oral)   Ht 5\' 10"  (1.778 m)   Wt 184 lb (83.5 kg)   SpO2 97%   BMI 26.40 kg/m  VS noted, not ill appaering Constitutional: Pt appears in NAD HENT: Head: NCAT.  Right Ear: External ear normal.  Left Ear: External ear normal.  Eyes: . Pupils are equal, round, and reactive to light. Conjunctivae and EOM are normal Nose:  without d/c or deformity Neck: Neck supple. Gross normal ROM Cardiovascular: Normal rate and regular rhythm.   Pulmonary/Chest: Effort normal and breath sounds without rales or wheezing. tender chest wall to right lateral chest t5-6 area without bruise or swelling or overt bony deformity Abd:  Soft, NT, ND, + BS, no organomegaly Neurological: Pt is alert. At baseline orientation, motor grossly intact Skin: Skin is warm. No rashes, other new lesions, no LE edema Psychiatric: Pt behavior is normal without agitation  No other exam findings Lab  Results  Component Value Date   WBC 6.5 09/09/2017   HGB 15.7 09/09/2017   HCT 45.1 09/09/2017   PLT 235.0 09/09/2017   GLUCOSE 108 (H) 09/09/2017   CHOL 150 05/07/2017   TRIG 64.0 05/07/2017   HDL 49.80 05/07/2017   LDLDIRECT 164.0 04/18/2012   LDLCALC 88 05/07/2017   ALT 19 05/07/2017   AST 18 05/07/2017   NA 137 09/09/2017   K 4.0 09/09/2017   CL 102 09/09/2017   CREATININE 1.27 09/09/2017   BUN 18 09/09/2017   CO2 28 09/09/2017   TSH 2.03 05/07/2017   PSA 1.81 05/07/2017   HGBA1C 5.7 05/07/2017      Assessment & Plan:

## 2017-12-14 NOTE — Assessment & Plan Note (Signed)
S/p fall, for cxr, and right rib films as cannot r/o fx, pain control,  to f/u any worsening symptoms or concerns

## 2018-01-05 ENCOUNTER — Other Ambulatory Visit: Payer: Self-pay | Admitting: Internal Medicine

## 2018-02-24 DIAGNOSIS — M62830 Muscle spasm of back: Secondary | ICD-10-CM | POA: Diagnosis not present

## 2018-02-24 DIAGNOSIS — M545 Low back pain: Secondary | ICD-10-CM | POA: Diagnosis not present

## 2018-02-24 DIAGNOSIS — S39012A Strain of muscle, fascia and tendon of lower back, initial encounter: Secondary | ICD-10-CM | POA: Diagnosis not present

## 2018-04-27 DIAGNOSIS — K760 Fatty (change of) liver, not elsewhere classified: Secondary | ICD-10-CM | POA: Diagnosis not present

## 2018-04-27 DIAGNOSIS — E785 Hyperlipidemia, unspecified: Secondary | ICD-10-CM | POA: Diagnosis not present

## 2018-05-02 ENCOUNTER — Encounter: Payer: Self-pay | Admitting: Internal Medicine

## 2018-05-02 ENCOUNTER — Ambulatory Visit (INDEPENDENT_AMBULATORY_CARE_PROVIDER_SITE_OTHER): Payer: BLUE CROSS/BLUE SHIELD | Admitting: Internal Medicine

## 2018-05-02 ENCOUNTER — Other Ambulatory Visit (INDEPENDENT_AMBULATORY_CARE_PROVIDER_SITE_OTHER): Payer: BLUE CROSS/BLUE SHIELD

## 2018-05-02 VITALS — BP 124/82 | HR 67 | Temp 97.7°F | Ht 70.0 in | Wt 184.0 lb

## 2018-05-02 DIAGNOSIS — E7800 Pure hypercholesterolemia, unspecified: Secondary | ICD-10-CM | POA: Diagnosis not present

## 2018-05-02 DIAGNOSIS — Z0000 Encounter for general adult medical examination without abnormal findings: Secondary | ICD-10-CM

## 2018-05-02 DIAGNOSIS — R739 Hyperglycemia, unspecified: Secondary | ICD-10-CM

## 2018-05-02 LAB — TSH: TSH: 1.94 u[IU]/mL (ref 0.35–4.50)

## 2018-05-02 LAB — HEMOGLOBIN A1C: Hgb A1c MFr Bld: 5.8 % (ref 4.6–6.5)

## 2018-05-02 LAB — PSA: PSA: 3.1 ng/mL (ref 0.10–4.00)

## 2018-05-02 NOTE — Patient Instructions (Addendum)
On further review, You had the Tdap tetenus shot in May 2014 (so none is needed today)  Please continue all other medications as before, and refills have been done if requested.  Please have the pharmacy call with any other refills you may need.  Please continue your efforts at being more active, low cholesterol diet, and weight control.  You are otherwise up to date with prevention measures today.  You will be contacted regarding the referral for: Cardiac CT Calcium scoring test  Please keep your appointments with your specialists as you may have planned - Dr Gloriann Loan in May 2020 for the follow up PSA and exam  Please go to the LAB in the Basement (turn left off the elevator) for the tests to be done today - for the PSA if not done earlier this year  You will be contacted by phone if any changes need to be made immediately.  Otherwise, you will receive a letter about your results with an explanation, but please check with MyChart first.  Please remember to sign up for MyChart if you have not done so, as this will be important to you in the future with finding out test results, communicating by private email, and scheduling acute appointments online when needed.  Please return in 1 year for your yearly visit, or sooner if needed, with Lab testing done 3-5 days before

## 2018-05-02 NOTE — Progress Notes (Signed)
Subjective:    Patient ID: Richard Roach, male    DOB: [birth year], 62 y.o.   MRN: [id number]  HPI  Here for wellness and f/u;  Overall doing ok;  Pt denies Chest pain, worsening SOB, DOE, wheezing, orthopnea, PND, worsening LE edema, palpitations, dizziness or syncope.  Pt denies neurological change such as new headache, facial or extremity weakness.  Pt denies polydipsia, polyuria, or low sugar symptoms. Pt states overall good compliance with treatment and medications, good tolerability, and has been trying to follow appropriate diet.  Pt denies worsening depressive symptoms, suicidal ideation or panic. No fever, night sweats, wt loss, loss of appetite, or other constitutional symptoms.  Pt states good ability with ADL's, has low fall risk, home safety reviewed and adequate, no other significant changes in hearing or vision, and only occasionally active with exercise.  Interested in Cardiac ct calcium score, asks for testing.  Admits to not taking statin every days, only a few times per wk, but plans to start daily tx. Past Medical History:  Diagnosis Date  . Allergic rhinitis   . Anxiety state 05/02/2015  . Chronic RLQ pain 05/02/2015  . Colon polyps   . Fatty liver   . GERD (gastroesophageal reflux disease)   . Hypercholesterolemia   . Hypertension   . Inguinal hernia   . Internal hemorrhoids   . Liver fibrosis 05/02/2015  . PVC's (premature ventricular contractions)   . Serrated adenoma of colon   . Sinus bradycardia    Past Surgical History:  Procedure Laterality Date  . KNEE ARTHROSCOPY Right June 2011  . KNEE ARTHROSCOPY Left 2012  . TONSILLECTOMY      reports that he quit smoking about 19 years ago. His smoking use included cigarettes. He has never used smokeless tobacco. He reports current alcohol use of about 14.0 standard drinks of alcohol per week. He reports that he does not use drugs. family history includes Asthma in his father; High blood pressure in his mother. No Known  Allergies Current Outpatient Medications on File Prior to Visit  Medication Sig Dispense Refill  . doxycycline (VIBRA-TABS) 100 MG tablet Take 1 tablet (100 mg total) by mouth 2 (two) times daily. 20 tablet 0  . metoprolol succinate (TOPROL-XL) 25 MG 24 hr tablet Take 1 tablet (25 mg total) by mouth daily. 90 tablet 3  . rosuvastatin (CRESTOR) 20 MG tablet Take 1 tablet (20 mg total) by mouth daily. 90 tablet 3  . sildenafil (VIAGRA) 100 MG tablet TAKE 1/2-1 TABLET BY MOUTH EVERY DAY AS NEEDED FOR ERECTILE DYSFUNCTION 5 tablet 0   Current Facility-Administered Medications on File Prior to Visit  Medication Dose Route Frequency Provider Last Rate Last Dose  . 0.9 %  sodium chloride infusion  500 mL Intravenous Once Ladene Artist, MD       Review of Systems Constitutional: Negative for other unusual diaphoresis, sweats, appetite or weight changes HENT: Negative for other worsening hearing loss, ear pain, facial swelling, mouth sores or neck stiffness.   Eyes: Negative for other worsening pain, redness or other visual disturbance.  Respiratory: Negative for other stridor or swelling Cardiovascular: Negative for other palpitations or other chest pain  Gastrointestinal: Negative for worsening diarrhea or loose stools, blood in stool, distention or other pain Genitourinary: Negative for hematuria, flank pain or other change in urine volume.  Musculoskeletal: Negative for myalgias or other joint swelling.  Skin: Negative for other color change, or other wound or worsening drainage.  Neurological: Negative for  other syncope or numbness. Hematological: Negative for other adenopathy or swelling Psychiatric/Behavioral: Negative for hallucinations, other worsening agitation, SI, self-injury, or new decreased concentration All other system neg per pt    Objective:   Physical Exam BP 124/82   Pulse 67   Temp 97.7 F (36.5 C) (Oral)   Ht 5\' 10"  (1.778 m)   Wt 184 lb (83.5 kg)   SpO2 97%   BMI  26.40 kg/m  VS noted,  Constitutional: Pt is oriented to person, place, and time. Appears well-developed and well-nourished, in no significant distress and comfortable Head: Normocephalic and atraumatic  Eyes: Conjunctivae and EOM are normal. Pupils are equal, round, and reactive to light Right Ear: External ear normal without discharge Left Ear: External ear normal without discharge Nose: Nose without discharge or deformity Mouth/Throat: Oropharynx is without other ulcerations and moist  Neck: Normal range of motion. Neck supple. No JVD present. No tracheal deviation present or significant neck LA or mass Cardiovascular: Normal rate, regular rhythm, normal heart sounds and intact distal pulses.   Pulmonary/Chest: WOB normal and breath sounds without rales or wheezing  Abdominal: Soft. Bowel sounds are normal. NT. No HSM  Musculoskeletal: Normal range of motion. Exhibits no edema Lymphadenopathy: Has no other cervical adenopathy.  Neurological: Pt is alert and oriented to person, place, and time. Pt has normal reflexes. No cranial nerve deficit. Motor grossly intact, Gait intact Skin: Skin is warm and dry. No rash noted or new ulcerations Psychiatric:  Has normal mood and affect. Behavior is normal without agitation No other exam findings  Lab Results  Component Value Date   WBC 6.5 09/09/2017   HGB 15.7 09/09/2017   HCT 45.1 09/09/2017   PLT 235.0 09/09/2017   GLUCOSE 108 (H) 09/09/2017   CHOL 150 05/07/2017   TRIG 64.0 05/07/2017   HDL 49.80 05/07/2017   LDLDIRECT 164.0 04/18/2012   LDLCALC 88 05/07/2017   ALT 19 05/07/2017   AST 18 05/07/2017   NA 137 09/09/2017   K 4.0 09/09/2017   CL 102 09/09/2017   CREATININE 1.27 09/09/2017   BUN 18 09/09/2017   CO2 28 09/09/2017   TSH 2.03 05/07/2017   PSA 1.81 05/07/2017   HGBA1C 5.7 05/07/2017      Assessment & Plan:

## 2018-05-02 NOTE — Assessment & Plan Note (Signed)

## 2018-05-02 NOTE — Assessment & Plan Note (Signed)
stable overall by history and exam, recent data reviewed with pt, and pt to continue medical treatment as before,  to f/u any worsening symptoms or concerns  

## 2018-05-02 NOTE — Assessment & Plan Note (Signed)
D/w pt, encouraged for daily statin use and low chol diet

## 2018-05-04 ENCOUNTER — Encounter: Payer: Self-pay | Admitting: Internal Medicine

## 2018-05-04 DIAGNOSIS — R351 Nocturia: Secondary | ICD-10-CM

## 2018-05-09 ENCOUNTER — Encounter: Payer: Self-pay | Admitting: Family Medicine

## 2018-05-09 ENCOUNTER — Ambulatory Visit: Payer: BLUE CROSS/BLUE SHIELD | Admitting: Family Medicine

## 2018-05-09 VITALS — BP 152/88 | HR 86 | Temp 98.0°F | Ht 70.0 in | Wt 188.0 lb

## 2018-05-09 DIAGNOSIS — J069 Acute upper respiratory infection, unspecified: Secondary | ICD-10-CM | POA: Diagnosis not present

## 2018-05-09 MED ORDER — HYDROCODONE-HOMATROPINE 5-1.5 MG/5ML PO SYRP: 5.0000 mL | ORAL_SOLUTION | Freq: Three times a day (TID) | ORAL | 0 refills | Status: DC | PRN

## 2018-05-09 NOTE — Progress Notes (Signed)
Richard Roach - 62 y.o. male MRN [id number]  Date of birth: [birth year]  Subjective Chief Complaint  Patient presents with  . Cough    Ongoing for 5 days-has been taking OTC meds with no improvement.    HPI Richard Roach is a 62 y.o. male who complains of congestion, nasal blockage, post nasal drip and cough described as productive of white sputum for 4 days. He denies a history of chest pain, dizziness, fatigue, fevers, myalgias, nausea and vomiting and denies a history of asthma. Patient does not smoke cigarettes.  He has tried Advil cold and sinus with some improvement.  He also had some left over hycodan which has been helpful for cough.    ROS:  A comprehensive ROS was completed and negative except as noted per HPI   No Known Allergies  Past Medical History:  Diagnosis Date  . Allergic rhinitis   . Anxiety state 05/02/2015  . Chronic RLQ pain 05/02/2015  . Colon polyps   . Fatty liver   . GERD (gastroesophageal reflux disease)   . Hypercholesterolemia   . Hypertension   . Inguinal hernia   . Internal hemorrhoids   . Liver fibrosis 05/02/2015  . PVC's (premature ventricular contractions)   . Serrated adenoma of colon   . Sinus bradycardia     Past Surgical History:  Procedure Laterality Date  . KNEE ARTHROSCOPY Right June 2011  . KNEE ARTHROSCOPY Left 2012  . TONSILLECTOMY      Social History   Socioeconomic History  . Marital status: Married    Spouse name: Not on file  . Number of children: 2  . Years of education: Not on file  . Highest education level: Not on file  Occupational History  . Occupation: Leisure centre manager  Social Needs  . Financial resource strain: Not on file  . Food insecurity:    Worry: Not on file    Inability: Not on file  . Transportation needs:    Medical: Not on file    Non-medical: Not on file  Tobacco Use  . Smoking status: Former Smoker    Types: Cigarettes    Last attempt to quit: 05/11/1998    Years since quitting: 20.0  .  Smokeless tobacco: Never Used  . Tobacco comment: smoked only socially x 5 yrs  Substance and Sexual Activity  . Alcohol use: Yes    Alcohol/week: 14.0 standard drinks    Types: 14 Glasses of wine per week  . Drug use: No  . Sexual activity: Yes  Lifestyle  . Physical activity:    Days per week: Not on file    Minutes per session: Not on file  . Stress: Not on file  Relationships  . Social connections:    Talks on phone: Not on file    Gets together: Not on file    Attends religious service: Not on file    Active member of club or organization: Not on file    Attends meetings of clubs or organizations: Not on file    Relationship status: Not on file  Other Topics Concern  . 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    Family History  Problem Relation Age of Onset  . Asthma Father   . High blood pressure Mother   . Colon cancer Neg Hx   . Colon polyps Neg Hx   . Diabetes Neg Hx   .  Kidney disease Neg Hx   . Esophageal cancer Neg Hx   . Liver cancer Neg Hx   . Rectal cancer Neg Hx   . Stomach cancer Neg Hx     Health Maintenance  Topic Date Due  . COLONOSCOPY  09/14/2022  . TETANUS/TDAP  05/07/2023  . INFLUENZA VACCINE  Completed  . Hepatitis C Screening  Completed  . HIV Screening  Completed    ----------------------------------------------------------------------------------------------------------------------------------------------------------------------------------------------------------------- Physical Exam BP (!) 152/88   Pulse 86   Temp 98 F (36.7 C) (Oral)   Ht 5\' 10"  (1.778 m)   Wt 188 lb (85.3 kg)   SpO2 97%   BMI 26.98 kg/m   Physical Exam Constitutional:      Appearance: Normal appearance.  HENT:     Head: Normocephalic.     Right Ear: Tympanic membrane normal.     Left Ear: Tympanic membrane normal.     Nose: Congestion (clear nasal discharge) present.      Mouth/Throat:     Mouth: Mucous membranes are moist.  Eyes:     General: No scleral icterus. Neck:     Musculoskeletal: Normal range of motion.  Cardiovascular:     Rate and Rhythm: Normal rate and regular rhythm.  Pulmonary:     Effort: Pulmonary effort is normal.     Breath sounds: Normal breath sounds.  Lymphadenopathy:     Cervical: No cervical adenopathy.  Skin:    General: Skin is warm and dry.     Findings: No rash.  Neurological:     General: No focal deficit present.     Mental Status: He is alert.  Psychiatric:        Mood and Affect: Mood normal.        Behavior: Behavior normal.     ------------------------------------------------------------------------------------------------------------------------------------------------------------------------------------------------------------------- Assessment and Plan  Upper respiratory tract infection Hycodan renewed for cough Symptomatic therapy suggested: push fluids, rest, use vaporizer or mist prn and return office visit prn if symptoms persist or worsen. Lack of antibiotic effectiveness discussed with him. Call or return to clinic prn if these symptoms worsen or fail to improve as anticipated.

## 2018-05-09 NOTE — Patient Instructions (Signed)

## 2018-05-09 NOTE — Assessment & Plan Note (Signed)
Hycodan renewed for cough Symptomatic therapy suggested: push fluids, rest, use vaporizer or mist prn and return office visit prn if symptoms persist or worsen. Lack of antibiotic effectiveness discussed with him. Call or return to clinic prn if these symptoms worsen or fail to improve as anticipated.

## 2018-05-17 DIAGNOSIS — H43393 Other vitreous opacities, bilateral: Secondary | ICD-10-CM | POA: Diagnosis not present

## 2018-05-17 DIAGNOSIS — H2513 Age-related nuclear cataract, bilateral: Secondary | ICD-10-CM | POA: Diagnosis not present

## 2018-05-17 DIAGNOSIS — H5203 Hypermetropia, bilateral: Secondary | ICD-10-CM | POA: Diagnosis not present

## 2018-05-17 DIAGNOSIS — H43811 Vitreous degeneration, right eye: Secondary | ICD-10-CM | POA: Diagnosis not present

## 2018-05-18 ENCOUNTER — Encounter: Payer: Self-pay | Admitting: Internal Medicine

## 2018-05-18 DIAGNOSIS — R972 Elevated prostate specific antigen [PSA]: Secondary | ICD-10-CM | POA: Diagnosis not present

## 2018-05-18 DIAGNOSIS — R3914 Feeling of incomplete bladder emptying: Secondary | ICD-10-CM | POA: Diagnosis not present

## 2018-05-18 DIAGNOSIS — N401 Enlarged prostate with lower urinary tract symptoms: Secondary | ICD-10-CM | POA: Diagnosis not present

## 2018-05-29 DIAGNOSIS — J209 Acute bronchitis, unspecified: Secondary | ICD-10-CM | POA: Diagnosis not present

## 2018-06-02 ENCOUNTER — Other Ambulatory Visit: Payer: Self-pay | Admitting: Internal Medicine

## 2018-06-28 ENCOUNTER — Other Ambulatory Visit: Payer: Self-pay | Admitting: Internal Medicine

## 2018-07-23 ENCOUNTER — Other Ambulatory Visit: Payer: Self-pay | Admitting: Internal Medicine

## 2018-08-31 ENCOUNTER — Other Ambulatory Visit: Payer: Self-pay | Admitting: Internal Medicine

## 2018-11-21 ENCOUNTER — Telehealth: Payer: Self-pay | Admitting: *Deleted

## 2018-11-21 ENCOUNTER — Ambulatory Visit (INDEPENDENT_AMBULATORY_CARE_PROVIDER_SITE_OTHER): Payer: BC Managed Care – PPO | Admitting: Internal Medicine

## 2018-11-21 ENCOUNTER — Ambulatory Visit: Payer: Self-pay | Admitting: Internal Medicine

## 2018-11-21 ENCOUNTER — Encounter: Payer: Self-pay | Admitting: Internal Medicine

## 2018-11-21 DIAGNOSIS — J069 Acute upper respiratory infection, unspecified: Secondary | ICD-10-CM | POA: Diagnosis not present

## 2018-11-21 DIAGNOSIS — R739 Hyperglycemia, unspecified: Secondary | ICD-10-CM

## 2018-11-21 DIAGNOSIS — Z20822 Contact with and (suspected) exposure to covid-19: Secondary | ICD-10-CM

## 2018-11-21 DIAGNOSIS — F411 Generalized anxiety disorder: Secondary | ICD-10-CM

## 2018-11-21 NOTE — Assessment & Plan Note (Signed)
stable overall by history and exam, recent data reviewed with pt, and pt to continue medical treatment as before,  to f/u any worsening symptoms or concerns  

## 2018-11-21 NOTE — Telephone Encounter (Signed)
-----   Message from Biagio Borg, MD sent at 11/21/2018  9:54 AM EDT ----- Regarding: covid testing Please to contact pt - has likely viral URI symptoms and small diarrhea, please arrange covid testing

## 2018-11-21 NOTE — Telephone Encounter (Signed)
Virtual visit has been made with Dr.John.

## 2018-11-21 NOTE — Telephone Encounter (Signed)
I have a have a head cold and feel weak.  My wife and my mother has the same symptoms.    My head is stuffed up.   Temperature 97.2 now with forehead thermometer.  I transferred pt to [facility] in Dr. Gwynn Burly office to be scheduled.  I sent my triage notes to the office.    Reason for Disposition . [1] COVID-19 infection suspected by caller or triager AND [2] mild symptoms (cough, fever, or others) AND [6] no complications or SOB  Answer Assessment - Initial Assessment Questions 1. COVID-19 DIAGNOSIS: "Who made your Coronavirus (COVID-19) diagnosis?" "Was it confirmed by a positive lab test?" If not diagnosed by a HCP, ask "Are there lots of cases (community spread) where you live?" (See public health department website, if unsure)     I've possibly been exposed to COVID-19.  People here Wednesday and Thursday  cutting down trees.   They are Asian.   No one had on masks.   I was right there with them outside. 2. ONSET: "When did the COVID-19 symptoms start?"      Started yesterday morning. 3. WORST SYMPTOM: "What is your worst symptom?" (e.g., cough, fever, shortness of breath, muscle aches)     Head is stuffy.  I was sweaty last night. 4. COUGH: "Do you have a cough?" If so, ask: "How bad is the cough?"       No 5. FEVER: "Do you have a fever?" If so, ask: "What is your temperature, how was it measured, and when did it start?"     No. 6. RESPIRATORY STATUS: "Describe your breathing?" (e.g., shortness of breath, wheezing, unable to speak)      No shortness of breath.    I just weak and run down. 7. BETTER-SAME-WORSE: "Are you getting better, staying the same or getting worse compared to yesterday?"  If getting worse, ask, "In what way?"     Same.   A little diarrhea.    Pressure behind my eyes. 8. HIGH RISK DISEASE: "Do you have any chronic medical problems?" (e.g., asthma, heart or lung disease, weak immune system, etc.)     Hypertension. 9. PREGNANCY: "Is there any chance you are pregnant?"  "When was your last menstrual period?"     N/A 10. OTHER SYMPTOMS: "Do you have any other symptoms?"  (e.g., chills, fatigue, headache, loss of smell or taste, muscle pain, sore throat)       Diarrhea started yesterday.   It's not bad at all.  Protocols used: CORONAVIRUS (COVID-19) DIAGNOSED OR SUSPECTED-A-AH

## 2018-11-21 NOTE — Telephone Encounter (Signed)
Pt scheduled for covid testing 11/22/18 @ [facility] testing site @ 12:00. Instructions given and order placed

## 2018-11-21 NOTE — Telephone Encounter (Signed)
Noted  

## 2018-11-21 NOTE — Assessment & Plan Note (Signed)
Exam c/w likely viral illness, for referral for COVID testing, and I suggested his mother seek testing as well

## 2018-11-21 NOTE — Progress Notes (Signed)
Patient ID: Richard Roach, male   DOB: 06-07-1955, 63 y.o.   MRN: [id number]  Virtual Visit via Video Note  I connected with Richard Roach on 11/21/18 at  9:40 AM EDT by a video enabled telemedicine application and verified that I am speaking with the correct person using two identifiers.  Location: Patient: at home Provider: at office   I discussed the limitations of evaluation and management by telemedicine and the availability of in person appointments. The patient expressed understanding and agreed to proceed.  History of Present Illness:  Here with 2 days acute onset fever, facial pain, pressure, headache, general weakness and malaise, and cleraish d/c, with mild ST and cough, but pt denies chest pain, wheezing, increased sob or doe, orthopnea, PND, increased LE swelling, palpitations, dizziness or syncope.  Seemed to start after visiting mother with URI as well.  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  Has small diarrhea, but Denies worsening reflux, abd pain, dysphagia, n/v, or blood.  Overall not really better with advil col and sinus.   Pt denies polydipsia, polyuria Denies worsening depressive symptoms, suicidal ideation, or panic    Past Medical History:  Diagnosis Date  . Allergic rhinitis   . Anxiety state 05/02/2015  . Chronic RLQ pain 05/02/2015  . Colon polyps   . Fatty liver   . GERD (gastroesophageal reflux disease)   . Hypercholesterolemia   . Hypertension   . Inguinal hernia   . Internal hemorrhoids   . Liver fibrosis 05/02/2015  . PVC's (premature ventricular contractions)   . Serrated adenoma of colon   . Sinus bradycardia    Past Surgical History:  Procedure Laterality Date  . KNEE ARTHROSCOPY Right June 2011  . KNEE ARTHROSCOPY Left 2012  . TONSILLECTOMY      reports that he quit smoking about 20 years ago. His smoking use included cigarettes. He has never used smokeless tobacco. He reports current alcohol  use of about 14.0 standard drinks of alcohol per week. He reports that he does not use drugs. family history includes Asthma in his father; High blood pressure in his mother. No Known Allergies Current Outpatient Medications on File Prior to Visit  Medication Sig Dispense Refill  . HYDROcodone-homatropine (HYCODAN) 5-1.5 MG/5ML syrup Take 5 mLs by mouth every 8 (eight) hours as needed for cough. 120 mL 0  . metoprolol succinate (TOPROL-XL) 25 MG 24 hr tablet TAKE 1 TABLET(25 MG) BY MOUTH DAILY 90 tablet 3  . rosuvastatin (CRESTOR) 20 MG tablet TAKE 1 TABLET(20 MG) BY MOUTH DAILY 90 tablet 3  . sildenafil (VIAGRA) 100 MG tablet TAKE 1/2-1 TABLET BY MOUTH EVERY DAY AS NEEDED FOR ERECTILE DYSFUNCTION 5 tablet 0   No current facility-administered medications on file prior to visit.     Observations/Objective: Alert, NAD, mild ill appearing, appropriate mood and affect, resps normal, cn 2-12 intact, moves all 4s, no visible rash or swelling Lab Results  Component Value Date   WBC 6.5 09/09/2017   HGB 15.7 09/09/2017   HCT 45.1 09/09/2017   PLT 235.0 09/09/2017   GLUCOSE 108 (H) 09/09/2017   CHOL 150 05/07/2017   TRIG 64.0 05/07/2017   HDL 49.80 05/07/2017   LDLDIRECT 164.0 04/18/2012   LDLCALC 88 05/07/2017   ALT 19 05/07/2017   AST 18 05/07/2017   NA 137 09/09/2017   K 4.0 09/09/2017   CL 102 09/09/2017   CREATININE 1.27 09/09/2017   BUN  18 09/09/2017   CO2 28 09/09/2017   TSH 1.94 05/02/2018   PSA 3.10 05/02/2018   HGBA1C 5.8 05/02/2018   Assessment and Plan: See notes  Follow Up Instructions: See notes   I discussed the assessment and treatment plan with the patient. The patient was provided an opportunity to ask questions and all were answered. The patient agreed with the plan and demonstrated an understanding of the instructions.   The patient was advised to call back or seek an in-person evaluation if the symptoms worsen or if the condition fails to improve as  anticipated.   Cathlean Cower, MD

## 2018-11-21 NOTE — Patient Instructions (Signed)
You will be contacted regarding the referral for: COVID testing  Please continue all other medications as before, and refills have been done if requested.  Please have the pharmacy call with any other refills you may need.  Please keep your appointments with your specialists as you may have planned

## 2018-11-22 ENCOUNTER — Other Ambulatory Visit: Payer: Self-pay

## 2018-11-22 DIAGNOSIS — Z20822 Contact with and (suspected) exposure to covid-19: Secondary | ICD-10-CM

## 2018-11-22 DIAGNOSIS — R6889 Other general symptoms and signs: Secondary | ICD-10-CM | POA: Diagnosis not present

## 2018-11-24 DIAGNOSIS — F419 Anxiety disorder, unspecified: Secondary | ICD-10-CM | POA: Diagnosis not present

## 2018-11-24 DIAGNOSIS — E785 Hyperlipidemia, unspecified: Secondary | ICD-10-CM | POA: Diagnosis not present

## 2018-11-24 DIAGNOSIS — K760 Fatty (change of) liver, not elsewhere classified: Secondary | ICD-10-CM | POA: Diagnosis not present

## 2018-11-24 DIAGNOSIS — R1010 Upper abdominal pain, unspecified: Secondary | ICD-10-CM | POA: Diagnosis not present

## 2018-11-26 LAB — NOVEL CORONAVIRUS, NAA: SARS-CoV-2, NAA: NOT DETECTED

## 2018-12-07 DIAGNOSIS — K760 Fatty (change of) liver, not elsewhere classified: Secondary | ICD-10-CM | POA: Diagnosis not present

## 2018-12-19 DIAGNOSIS — F1010 Alcohol abuse, uncomplicated: Secondary | ICD-10-CM | POA: Diagnosis not present

## 2018-12-19 DIAGNOSIS — K709 Alcoholic liver disease, unspecified: Secondary | ICD-10-CM | POA: Diagnosis not present

## 2018-12-19 DIAGNOSIS — F419 Anxiety disorder, unspecified: Secondary | ICD-10-CM | POA: Diagnosis not present

## 2018-12-19 DIAGNOSIS — R1010 Upper abdominal pain, unspecified: Secondary | ICD-10-CM | POA: Diagnosis not present

## 2018-12-19 DIAGNOSIS — K760 Fatty (change of) liver, not elsewhere classified: Secondary | ICD-10-CM | POA: Diagnosis not present

## 2018-12-26 ENCOUNTER — Other Ambulatory Visit: Payer: Self-pay

## 2018-12-26 DIAGNOSIS — Z20822 Contact with and (suspected) exposure to covid-19: Secondary | ICD-10-CM

## 2018-12-28 LAB — NOVEL CORONAVIRUS, NAA: SARS-CoV-2, NAA: NOT DETECTED

## 2019-01-09 DIAGNOSIS — Z7289 Other problems related to lifestyle: Secondary | ICD-10-CM | POA: Diagnosis not present

## 2019-01-09 DIAGNOSIS — J3489 Other specified disorders of nose and nasal sinuses: Secondary | ICD-10-CM | POA: Insufficient documentation

## 2019-01-09 DIAGNOSIS — Z87891 Personal history of nicotine dependence: Secondary | ICD-10-CM | POA: Diagnosis not present

## 2019-01-30 DIAGNOSIS — H938X3 Other specified disorders of ear, bilateral: Secondary | ICD-10-CM | POA: Diagnosis not present

## 2019-01-30 DIAGNOSIS — Z7289 Other problems related to lifestyle: Secondary | ICD-10-CM | POA: Diagnosis not present

## 2019-01-30 DIAGNOSIS — J3489 Other specified disorders of nose and nasal sinuses: Secondary | ICD-10-CM | POA: Diagnosis not present

## 2019-01-30 DIAGNOSIS — Z87891 Personal history of nicotine dependence: Secondary | ICD-10-CM | POA: Diagnosis not present

## 2019-02-07 ENCOUNTER — Other Ambulatory Visit: Payer: Self-pay | Admitting: Internal Medicine

## 2019-02-07 MED ORDER — SILDENAFIL CITRATE 100 MG PO TABS: ORAL_TABLET | ORAL | 0 refills | Status: DC

## 2019-02-07 NOTE — Telephone Encounter (Signed)
Medication Refill - Medication: sildenafil (VIAGRA) 100 MG tablet    Has the patient contacted their pharmacy? Yes.   (Agent: If no, request that the patient contact the pharmacy for the refill.) (Agent: If yes, when and what did the pharmacy advise?)  Preferred Pharmacy (with phone number or street name):  [facility] [place], [place] AT [address] [place] [address]  Phone: [phone] Fax: [phone]  Not a 24 hour pharmacy; exact hours not known.     Agent: Please be advised that RX refills may take up to 3 business days. We ask that you follow-up with your pharmacy.

## 2019-02-07 NOTE — Telephone Encounter (Signed)
Requested medication (s) are due for refill today: yes  Requested medication (s) are on the active medication list: yes  Last refill:  08/31/2018  Future visit scheduled: no  Notes to clinic: review for refill   Requested Prescriptions  Pending Prescriptions Disp Refills   sildenafil (VIAGRA) 100 MG tablet 5 tablet 0    Sig: TAKE 1/2-1 TABLET BY MOUTH EVERY DAY AS NEEDED FOR ERECTILE DYSFUNCTION     Urology: Erectile Dysfunction Agents Failed - 02/07/2019  3:28 PM      Failed - Last BP in normal range    BP Readings from Last 1 Encounters:  05/09/18 (!) 152/88         Passed - Valid encounter within last 12 months    Recent Outpatient Visits          2 months ago Upper respiratory tract infection, unspecified type   [facility], James W, MD   9 months ago Viral upper respiratory tract infection   [facility], [place], DO   9 months ago Preventative health care   [facility] -Georges Mouse, MD   1 year ago Right-sided chest pain   [facility] -Georges Mouse, MD   1 year ago Groin lump   [facility] -Georges Mouse, MD

## 2019-05-17 ENCOUNTER — Telehealth: Payer: Self-pay | Admitting: Cardiology

## 2019-05-17 NOTE — Progress Notes (Signed)
Cardiology Office Note:   Date:  05/18/2019  NAME:  Richard Roach    MRN: [id number] DOB:  Jan 01, 1956   PCP:  Biagio Borg, MD  Cardiologist:  Evalina Field, MD   Referring MD: Biagio Borg, MD   Chief Complaint  Patient presents with  . Chest Pain   History of Present Illness:   Richard Roach is a 64 y.o. male with a hx of PVCs/palpitations (Holter with 0.05% burden, negative ETT 2016), fatty liver, hyperlipidemia who is being seen today for the evaluation of chest pain at the request of Biagio Borg, MD.  He presents for evaluation of 6 days of right-sided sharp chest pain.  He reports he is recently gotten a new sleep number mattress.  He is sleeping on elevation.  He is doing this to increase his comfort level not for any particular reason.  He reports he has had a crampy sharp intermittent pain in the right side.  Appears to be worse with inspiration.  Comes and goes.  No real trigger or alleviating factor.  He has noted some relief with ibuprofen.  He is also had some similar pain in the left axillary region.  He reports no difficulties do any activities around the house.  No central chest pain or pressure.  No worsening palpitations.  No lower extremity edema.  He denies any symptoms of orthopnea.  He reports it is worse with torso rotation.  His EKG today is without any acute ischemic changes.  His examination is unremarkable other than some tenderness with palpation in the right axillary and left axillary region.  Regarding his hyperlipidemia he is maintained on Crestor.  No recent blood work in the past year due to the coronavirus pandemic.  He reports he is on hold for most of his medical care until this resolves.  Blood pressure slightly elevated only on metoprolol.  He does not smoke.  He reports he was diagnosed with a fatty liver and is cut back on alcohol.  He does follow with a liver specialist at [facility].  Problem List: 1. PVCs/palpitations (Holter 2016, 0.05% PVC  burden) 2. ETT 04/2017: 12:36, 14.2 METs, no ischemia  3. Echo 65% Grade 2 DD 4. Fatty liver  5. Hyperlipidemia   Past Medical History: Past Medical History:  Diagnosis Date  . Allergic rhinitis   . Anxiety state 05/02/2015  . Chronic RLQ pain 05/02/2015  . Colon polyps   . Fatty liver   . GERD (gastroesophageal reflux disease)   . Hypercholesterolemia   . Hypertension   . Inguinal hernia   . Internal hemorrhoids   . Liver fibrosis 05/02/2015  . PVC's (premature ventricular contractions)   . Serrated adenoma of colon   . Sinus bradycardia     Past Surgical History: Past Surgical History:  Procedure Laterality Date  . KNEE ARTHROSCOPY Right June 2011  . KNEE ARTHROSCOPY Left 2012  . TONSILLECTOMY      Current Medications: Current Meds  Medication Sig  . metoprolol succinate (TOPROL-XL) 25 MG 24 hr tablet TAKE 1 TABLET(25 MG) BY MOUTH DAILY  . rosuvastatin (CRESTOR) 20 MG tablet TAKE 1 TABLET(20 MG) BY MOUTH DAILY  . sildenafil (VIAGRA) 100 MG tablet TAKE 1/2-1 TABLET BY MOUTH EVERY DAY AS NEEDED FOR ERECTILE DYSFUNCTION     Allergies:    Patient has no known allergies.   Social History: Social History   Socioeconomic History  . Marital status: Married    Spouse name: Not  on file  . Number of children: 2  . Years of education: Not on file  . Highest education level: Not on file  Occupational History  . Occupation: Leisure centre manager  Tobacco Use  . Smoking status: Former Smoker    Types: Cigarettes    Quit date: 05/11/1998    Years since quitting: 21.0  . Smokeless tobacco: Never Used  . Tobacco comment: smoked only socially x 5 yrs  Substance and Sexual Activity  . Alcohol use: Yes    Alcohol/week: 14.0 standard drinks    Types: 14 Glasses of wine per week  . Drug use: No  . Sexual activity: Yes  Other Topics Concern  . 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   Social  Determinants of Health   Financial Resource Strain:   . Difficulty of Paying Living Expenses: Not on file  Food Insecurity:   . Worried About Charity fundraiser in the Last Year: Not on file  . Ran Out of Food in the Last Year: Not on file  Transportation Needs:   . Lack of Transportation (Medical): Not on file  . Lack of Transportation (Non-Medical): Not on file  Physical Activity:   . Days of Exercise per Week: Not on file  . Minutes of Exercise per Session: Not on file  Stress:   . Feeling of Stress : Not on file  Social Connections:   . Frequency of Communication with Friends and Family: Not on file  . Frequency of Social Gatherings with Friends and Family: Not on file  . Attends Religious Services: Not on file  . Active Member of Clubs or Organizations: Not on file  . Attends Archivist Meetings: Not on file  . Marital Status: Not on file     Family History: The patient's family history includes Asthma in his father; High blood pressure in his mother. There is no history of Colon cancer, Colon polyps, Diabetes, Kidney disease, Esophageal cancer, Liver cancer, Rectal cancer, or Stomach cancer.  ROS:   All other ROS reviewed and negative. Pertinent positives noted in the HPI.     EKGs/Labs/Other Studies Reviewed:   The following studies were personally reviewed by me today:  EKG:  EKG is ordered today.  The ekg ordered today demonstrates normal sinus rhythm, heart rate 60, no acute ST-T changes, no evidence of prior infarction, normal EKG, and was personally reviewed by me.   TTE 04/2013 - Left ventricle: The cavity size was mildly dilated. Wall  thickness was normal. Systolic function was vigorous. The  estimated ejection fraction was in the range of 65% to  70%. Features are consistent with a pseudonormal left  ventricular filling pattern, with concomitant abnormal  relaxation and increased filling pressure (grade 2  diastolic dysfunction).  -  Left atrium: The atrium was mildly dilated.  Transthoracic echocardiography. M-mode, complete 2D,  spectral Doppler, and color Doppler. Height: Height:  177.8cm. Height: 70in. Weight: Weight: 81.6kg. Weight:  179.6lb. Body mass index: BMI: 25.8kg/m^2. Body surface  area:  BSA: 44m^2. Blood pressure:   132/80. Patient  status: Outpatient. Location: [facility]   Recent Labs: No results found for requested labs within last 8760 hours.   Recent Lipid Panel    Component Value Date/Time   CHOL 150 05/07/2017 0816   TRIG 64.0 05/07/2017 0816   HDL 49.80 05/07/2017 0816   CHOLHDL  3 05/07/2017 0816   VLDL 12.8 05/07/2017 0816   LDLCALC 88 05/07/2017 0816   LDLDIRECT 164.0 04/18/2012 0930    Physical Exam:   VS:  BP (!) 148/69   Pulse 60   Temp (!) 96.8 F (36 C)   Ht 5\' 10"  (1.778 m)   Wt 186 lb 3.2 oz (84.5 kg)   SpO2 100%   BMI 26.72 kg/m    Wt Readings from Last 3 Encounters:  05/18/19 186 lb 3.2 oz (84.5 kg)  05/09/18 188 lb (85.3 kg)  05/02/18 184 lb (83.5 kg)    General: Well nourished, well developed, in no acute distress Heart: Atraumatic, normal size  Eyes: PEERLA, EOMI  Neck: Supple, no JVD Endocrine: No thryomegaly Cardiac: Normal S1, S2; RRR; no murmurs, rubs, or gallops Lungs: Clear to auscultation bilaterally, no wheezing, rhonchi or rales  Abd: Soft, nontender, no hepatomegaly  Ext: No edema, pulses 2+ Musculoskeletal: Tenderness to palpation in the right axilla as well as left axillary region Skin: Warm and dry, no rashes   Neuro: Alert and oriented to person, place, time, and situation, CNII-XII grossly intact, no focal deficits  Psych: Normal mood and affect   ASSESSMENT:   Abdias Martha is a 64 y.o. male who presents for the following: 1. Chest pain of uncertain etiology   2. PVC's (premature ventricular contractions)   3. Palpitations   4. Mixed hyperlipidemia   5. Elevated blood sugar     PLAN:   1. Chest pain of uncertain  etiology -Appears to have musculoskeletal right axillary as well as left axillary pain.  He does have some tenderness to palpation.  It is improved with ibuprofen.  EKG without acute ischemic changes.  No symptoms to suggest angina.  I have instructed him to continue ibuprofen at home.  We can also prescribe a short course if needed.  2. PVC's (premature ventricular contractions) 3. Palpitations -No issues  4. Mixed hyperlipidemia -No recent lipid profile -We will plan to check a full panel of labs including CBC, CMP, lipid profile, A1c for further risk assessment -I discussed with him calcium scoring and he is interested -He will get the labs over the next few weeks when he is fasting and we will see him back in 6 months for virtual visit to discuss calcium scoring and further stratification  Disposition: Return in about 6 months (around 11/15/2019).  Medication Adjustments/Labs and Tests Ordered: Current medicines are reviewed at length with the patient today.  Concerns regarding medicines are outlined above.  Orders Placed This Encounter  Procedures  . CBC w/Diff/Platelet  . Comprehensive Metabolic Panel (CMET)  . Lipid Profile  . HgB A1c  . EKG 12-Lead   No orders of the defined types were placed in this encounter.   Patient Instructions  Medication Instructions:  Continue same medications *If you need a refill on your cardiac medications before your next appointment, please call your pharmacy*  Lab Work: Cbc,cmet,lipid panel,a1c  Lab order enclosed   Testing/Procedures: None ordered  Follow-Up: At [facility], you and your health needs are our priority.  As part of our continuing mission to provide you with exceptional heart care, we have created designated Provider Care Teams.  These Care Teams include your primary Cardiologist (physician) and Advanced Practice Providers (APPs -  Physician Assistants and Nurse Practitioners) who all work together to provide you with  the care you need, when you need it.  Your next appointment:  6 months  Call in April  to schedule July appointment   The format for your next appointment:  Virtual   Provider:  Dr.O'Neal      Signed, [place] T. Audie Box, [place]  [address] [place], [place] [address] [phone]  05/18/2019 9:28 AM

## 2019-05-17 NOTE — Telephone Encounter (Signed)
Pt called to report that he has been having a sharp pain on his right side for 3 days.. he says it is hard to take a deep breath and it hurts mostly with exertion... not necessarily "movement" but when he gets up to do something such as let the dog out.   He denies SOB but still reports not being able to breathe deeply.. he is not dizzy and no n/v. No cough, fever, no strenuous activity to suggest injury... no h/o blood clots/ DVT or PE.   Pt says rest sometimes relieves it and it is not affecting his sleep at night.   Pt is anxious it is cardiac.. he has not been seen by Dr. Stanford Breed since 2018... I made him an appt tomorrow morning with Dr. Jenetta DownerNori Riis but strongly urged him that if anything worsens or persists even with rest to go to the ED ASAP.Marland Kitchen pt verbalized understanding and agreed. His wife will drive him to the appt. But wait in the car.       COVID-19 Pre-Screening Questions:  . In the past 7 to 10 days have you had a cough,  shortness of breath, headache, congestion, fever (100 or greater) body aches, chills, sore throat, or sudden loss of taste or sense of smell? NO . Have you been around anyone with known Covid 19. NO . Have you been around anyone who is awaiting Covid 19 test results in the past 7 to 10 days? NO . Have you been around anyone who has been exposed to Covid 19, or has mentioned symptoms of Covid 19 within the past 7 to 10 days? NO  If you have any concerns/questions about symptoms patients report during screening (either on the phone or at threshold). Contact the provider seeing the patient or DOD for further guidance.  If neither are available contact a member of the leadership team.

## 2019-05-17 NOTE — Telephone Encounter (Signed)
Pt c/o of Chest Pain: STAT if CP now or developed within 24 hours  1. Are you having CP right now? Yes. Right side of chest. Mainly when standing.  2. Are you experiencing any other symptoms (ex. SOB, nausea, vomiting, sweating)? No, however patient states he can't take a deep breath.  3. How long have you been experiencing CP? 3 days   4. Is your CP continuous or coming and going? Coming and going  5. Have you taken Nitroglycerin? no ?

## 2019-05-18 ENCOUNTER — Other Ambulatory Visit: Payer: Self-pay

## 2019-05-18 ENCOUNTER — Ambulatory Visit: Payer: BC Managed Care – PPO | Admitting: Cardiovascular Disease

## 2019-05-18 ENCOUNTER — Encounter: Payer: Self-pay | Admitting: Cardiovascular Disease

## 2019-05-18 VITALS — BP 148/69 | HR 60 | Temp 96.8°F | Ht 70.0 in | Wt 186.2 lb

## 2019-05-18 DIAGNOSIS — R079 Chest pain, unspecified: Secondary | ICD-10-CM | POA: Diagnosis not present

## 2019-05-18 DIAGNOSIS — E782 Mixed hyperlipidemia: Secondary | ICD-10-CM

## 2019-05-18 DIAGNOSIS — R002 Palpitations: Secondary | ICD-10-CM | POA: Diagnosis not present

## 2019-05-18 DIAGNOSIS — R739 Hyperglycemia, unspecified: Secondary | ICD-10-CM

## 2019-05-18 DIAGNOSIS — I493 Ventricular premature depolarization: Secondary | ICD-10-CM

## 2019-05-18 NOTE — Patient Instructions (Signed)
Medication Instructions:  Continue same medications *If you need a refill on your cardiac medications before your next appointment, please call your pharmacy*  Lab Work: Cbc,cmet,lipid panel,a1c  Lab order enclosed   Testing/Procedures: None ordered  Follow-Up: At [facility], you and your health needs are our priority.  As part of our continuing mission to provide you with exceptional heart care, we have created designated Provider Care Teams.  These Care Teams include your primary Cardiologist (physician) and Advanced Practice Providers (APPs -  Physician Assistants and Nurse Practitioners) who all work together to provide you with the care you need, when you need it.  Your next appointment:  6 months  Call in April to schedule July appointment   The format for your next appointment:  Virtual   Provider:  Dr.O'Neal

## 2019-05-22 DIAGNOSIS — I493 Ventricular premature depolarization: Secondary | ICD-10-CM | POA: Diagnosis not present

## 2019-05-22 DIAGNOSIS — R079 Chest pain, unspecified: Secondary | ICD-10-CM | POA: Diagnosis not present

## 2019-05-22 DIAGNOSIS — E782 Mixed hyperlipidemia: Secondary | ICD-10-CM | POA: Diagnosis not present

## 2019-05-22 DIAGNOSIS — R002 Palpitations: Secondary | ICD-10-CM | POA: Diagnosis not present

## 2019-05-22 DIAGNOSIS — R739 Hyperglycemia, unspecified: Secondary | ICD-10-CM | POA: Diagnosis not present

## 2019-05-22 LAB — LIPID PANEL
Chol/HDL Ratio: 2.6 ratio (ref 0.0–5.0)
Cholesterol, Total: 171 mg/dL (ref 100–199)
HDL: 65 mg/dL (ref >39)
LDL Chol Calc (NIH): 93 mg/dL (ref 0–99)
Triglycerides: 69 mg/dL (ref 0–149)
VLDL Cholesterol Cal: 13 mg/dL (ref 5–40)

## 2019-05-22 LAB — COMPREHENSIVE METABOLIC PANEL
ALT: 21 IU/L (ref 0–44)
AST: 17 IU/L (ref 0–40)
Albumin/Globulin Ratio: 1.8 (ref 1.2–2.2)
Albumin: 4.4 g/dL (ref 3.8–4.8)
Alkaline Phosphatase: 74 IU/L (ref 39–117)
BUN/Creatinine Ratio: 14 (ref 10–24)
BUN: 15 mg/dL (ref 8–27)
Bilirubin Total: 0.7 mg/dL (ref 0.0–1.2)
CO2: 25 mmol/L (ref 20–29)
Calcium: 9.5 mg/dL (ref 8.6–10.2)
Chloride: 102 mmol/L (ref 96–106)
Creatinine, Ser: 1.08 mg/dL (ref 0.76–1.27)
GFR calc Af Amer: 85 mL/min/{1.73_m2} (ref >59)
GFR calc non Af Amer: 73 mL/min/{1.73_m2} (ref >59)
Globulin, Total: 2.5 g/dL (ref 1.5–4.5)
Glucose: 93 mg/dL (ref 65–99)
Potassium: 4.4 mmol/L (ref 3.5–5.2)
Sodium: 140 mmol/L (ref 134–144)
Total Protein: 6.9 g/dL (ref 6.0–8.5)

## 2019-05-22 LAB — CBC WITH DIFFERENTIAL/PLATELET
Basophils Absolute: 0 10*3/uL (ref 0.0–0.2)
Basos: 1 %
EOS (ABSOLUTE): 0.2 10*3/uL (ref 0.0–0.4)
Eos: 4 %
Hematocrit: 48.2 % (ref 37.5–51.0)
Hemoglobin: 16.6 g/dL (ref 13.0–17.7)
Immature Grans (Abs): 0 10*3/uL (ref 0.0–0.1)
Immature Granulocytes: 0 %
Lymphocytes Absolute: 1.5 10*3/uL (ref 0.7–3.1)
Lymphs: 29 %
MCH: 30.6 pg (ref 26.6–33.0)
MCHC: 34.4 g/dL (ref 31.5–35.7)
MCV: 89 fL (ref 79–97)
Monocytes Absolute: 0.4 10*3/uL (ref 0.1–0.9)
Monocytes: 9 %
Neutrophils Absolute: 3 10*3/uL (ref 1.4–7.0)
Neutrophils: 57 %
Platelets: 205 10*3/uL (ref 150–450)
RBC: 5.43 x10E6/uL (ref 4.14–5.80)
RDW: 12.5 % (ref 11.6–15.4)
WBC: 5.1 10*3/uL (ref 3.4–10.8)

## 2019-05-22 LAB — HEMOGLOBIN A1C
Est. average glucose Bld gHb Est-mCnc: 114 mg/dL
Hgb A1c MFr Bld: 5.6 % (ref 4.8–5.6)

## 2019-05-23 ENCOUNTER — Ambulatory Visit: Payer: BC Managed Care – PPO | Admitting: General Practice

## 2019-06-01 ENCOUNTER — Ambulatory Visit: Payer: BC Managed Care – PPO | Attending: Internal Medicine

## 2019-06-01 DIAGNOSIS — Z23 Encounter for immunization: Secondary | ICD-10-CM | POA: Insufficient documentation

## 2019-06-01 NOTE — Progress Notes (Signed)
   Covid-19 Vaccination Clinic  Name:  Richard Roach    MRN: [id number] DOB: 08/10/1955  06/01/2019  [name] was observed post Covid-19 immunization for 15 minutes without incidence. He was provided with Vaccine Information Sheet and instruction to access the V-Safe system.   [name] was instructed to call 911 with any severe reactions post vaccine: Marland Kitchen Difficulty breathing  . Swelling of your face and throat  . A fast heartbeat  . A bad rash all over your body  . Dizziness and weakness    Immunizations Administered    Name Date Dose VIS Date Route   Pfizer COVID-19 Vaccine 06/01/2019  5:52 PM 0.3 mL 04/21/2019 Intramuscular   Manufacturer: [place]   Lot: [id number]   [place]: [id number]

## 2019-06-22 ENCOUNTER — Ambulatory Visit: Payer: BC Managed Care – PPO | Attending: Internal Medicine

## 2019-06-22 DIAGNOSIS — Z23 Encounter for immunization: Secondary | ICD-10-CM

## 2019-06-22 NOTE — Progress Notes (Signed)
   Covid-19 Vaccination Clinic  Name:  Richard Roach    MRN: [id number] DOB: [birth year]  06/22/2019  [name] was observed post Covid-19 immunization for 15 minutes without incidence. He was provided with Vaccine Information Sheet and instruction to access the V-Safe system.   [name] was instructed to call 911 with any severe reactions post vaccine: Marland Kitchen Difficulty breathing  . Swelling of your face and throat  . A fast heartbeat  . A bad rash all over your body  . Dizziness and weakness    Immunizations Administered    Name Date Dose VIS Date Route   Pfizer COVID-19 Vaccine 06/22/2019  9:24 AM 0.3 mL 04/21/2019 Intramuscular   Manufacturer: [place]   Lot: [id number]   [place]: [id number]

## 2019-06-26 ENCOUNTER — Telehealth: Payer: Self-pay

## 2019-06-26 NOTE — Telephone Encounter (Signed)
Please wait 2 wks after the second covid shot before having the shingrix shot, thanks

## 2019-06-26 NOTE — Telephone Encounter (Signed)
New message    The patient calls receive the COVID vaccine, him and his wife  What the distance between vaccine and shingle shot.

## 2019-06-26 NOTE — Telephone Encounter (Signed)
Mychart message sent.

## 2019-07-24 ENCOUNTER — Other Ambulatory Visit: Payer: Self-pay | Admitting: Internal Medicine

## 2019-07-24 NOTE — Telephone Encounter (Signed)
Please refill as per office routine med refill policy (all routine meds refilled for 3 mo or monthly per pt preference up to one year from last visit, then month to month grace period for 3 mo, then further med refills will have to be denied)  

## 2019-07-26 DIAGNOSIS — D229 Melanocytic nevi, unspecified: Secondary | ICD-10-CM | POA: Diagnosis not present

## 2019-07-26 DIAGNOSIS — L578 Other skin changes due to chronic exposure to nonionizing radiation: Secondary | ICD-10-CM | POA: Diagnosis not present

## 2019-07-26 DIAGNOSIS — L821 Other seborrheic keratosis: Secondary | ICD-10-CM | POA: Diagnosis not present

## 2019-07-26 DIAGNOSIS — D485 Neoplasm of uncertain behavior of skin: Secondary | ICD-10-CM | POA: Diagnosis not present

## 2019-08-23 DIAGNOSIS — L578 Other skin changes due to chronic exposure to nonionizing radiation: Secondary | ICD-10-CM | POA: Diagnosis not present

## 2019-09-01 ENCOUNTER — Other Ambulatory Visit: Payer: Self-pay | Admitting: Internal Medicine

## 2019-09-19 ENCOUNTER — Other Ambulatory Visit: Payer: Self-pay

## 2019-09-19 ENCOUNTER — Encounter: Payer: Self-pay | Admitting: Internal Medicine

## 2019-09-19 ENCOUNTER — Ambulatory Visit (INDEPENDENT_AMBULATORY_CARE_PROVIDER_SITE_OTHER): Payer: BC Managed Care – PPO | Admitting: Internal Medicine

## 2019-09-19 VITALS — BP 128/80 | HR 61 | Temp 98.0°F | Ht 70.0 in | Wt 184.0 lb

## 2019-09-19 DIAGNOSIS — E559 Vitamin D deficiency, unspecified: Secondary | ICD-10-CM | POA: Diagnosis not present

## 2019-09-19 DIAGNOSIS — Z125 Encounter for screening for malignant neoplasm of prostate: Secondary | ICD-10-CM | POA: Diagnosis not present

## 2019-09-19 DIAGNOSIS — R739 Hyperglycemia, unspecified: Secondary | ICD-10-CM

## 2019-09-19 DIAGNOSIS — Z0000 Encounter for general adult medical examination without abnormal findings: Secondary | ICD-10-CM

## 2019-09-19 DIAGNOSIS — E538 Deficiency of other specified B group vitamins: Secondary | ICD-10-CM | POA: Diagnosis not present

## 2019-09-19 DIAGNOSIS — E611 Iron deficiency: Secondary | ICD-10-CM | POA: Diagnosis not present

## 2019-09-19 DIAGNOSIS — E7800 Pure hypercholesterolemia, unspecified: Secondary | ICD-10-CM

## 2019-09-19 LAB — CBC WITH DIFFERENTIAL/PLATELET
Basophils Absolute: 0 10*3/uL (ref 0.0–0.1)
Basophils Relative: 0.6 % (ref 0.0–3.0)
Eosinophils Absolute: 0.1 10*3/uL (ref 0.0–0.7)
Eosinophils Relative: 2.1 % (ref 0.0–5.0)
HCT: 48.6 % (ref 39.0–52.0)
Hemoglobin: 16.4 g/dL (ref 13.0–17.0)
Lymphocytes Relative: 24.9 % (ref 12.0–46.0)
Lymphs Abs: 1.4 10*3/uL (ref 0.7–4.0)
MCHC: 33.8 g/dL (ref 30.0–36.0)
MCV: 92.3 fl (ref 78.0–100.0)
Monocytes Absolute: 0.5 10*3/uL (ref 0.1–1.0)
Monocytes Relative: 9.3 % (ref 3.0–12.0)
Neutro Abs: 3.5 10*3/uL (ref 1.4–7.7)
Neutrophils Relative %: 63.1 % (ref 43.0–77.0)
Platelets: 220 10*3/uL (ref 150.0–400.0)
RBC: 5.27 Mil/uL (ref 4.22–5.81)
RDW: 13.7 % (ref 11.5–15.5)
WBC: 5.6 10*3/uL (ref 4.0–10.5)

## 2019-09-19 LAB — URINALYSIS, ROUTINE W REFLEX MICROSCOPIC
Bilirubin Urine: NEGATIVE
Hgb urine dipstick: NEGATIVE
Ketones, ur: NEGATIVE
Leukocytes,Ua: NEGATIVE
Nitrite: NEGATIVE
RBC / HPF: NONE SEEN
Specific Gravity, Urine: 1.02 (ref 1.000–1.030)
Total Protein, Urine: NEGATIVE
Urine Glucose: NEGATIVE
Urobilinogen, UA: 0.2 (ref 0.0–1.0)
WBC, UA: NONE SEEN
pH: 7.5 (ref 5.0–8.0)

## 2019-09-19 LAB — BASIC METABOLIC PANEL
BUN: 18 mg/dL (ref 6–23)
CO2: 28 mEq/L (ref 19–32)
Calcium: 9.5 mg/dL (ref 8.4–10.5)
Chloride: 103 mEq/L (ref 96–112)
Creatinine, Ser: 0.97 mg/dL (ref 0.40–1.50)
GFR: 78.3 mL/min (ref >60.00)
Glucose, Bld: 105 mg/dL — ABNORMAL HIGH (ref 70–99)
Potassium: 4.4 mEq/L (ref 3.5–5.1)
Sodium: 139 mEq/L (ref 135–145)

## 2019-09-19 LAB — TSH: TSH: 1.49 u[IU]/mL (ref 0.35–4.50)

## 2019-09-19 LAB — IBC PANEL
Iron: 114 ug/dL (ref 42–165)
Saturation Ratios: 34.7 % (ref 20.0–50.0)
Transferrin: 235 mg/dL (ref 212.0–360.0)

## 2019-09-19 LAB — LIPID PANEL
Cholesterol: 184 mg/dL (ref 0–200)
HDL: 57.5 mg/dL
LDL Cholesterol: 109 mg/dL — ABNORMAL HIGH (ref 0–99)
NonHDL: 126.97
Total CHOL/HDL Ratio: 3
Triglycerides: 91 mg/dL (ref 0.0–149.0)
VLDL: 18.2 mg/dL (ref 0.0–40.0)

## 2019-09-19 LAB — HEPATIC FUNCTION PANEL
ALT: 22 U/L (ref 0–53)
AST: 20 U/L (ref 0–37)
Albumin: 4.4 g/dL (ref 3.5–5.2)
Alkaline Phosphatase: 65 U/L (ref 39–117)
Bilirubin, Direct: 0.1 mg/dL (ref 0.0–0.3)
Total Bilirubin: 0.8 mg/dL (ref 0.2–1.2)
Total Protein: 7.2 g/dL (ref 6.0–8.3)

## 2019-09-19 LAB — VITAMIN D 25 HYDROXY (VIT D DEFICIENCY, FRACTURES): VITD: 28.82 ng/mL — ABNORMAL LOW (ref 30.00–100.00)

## 2019-09-19 LAB — HEMOGLOBIN A1C: Hgb A1c MFr Bld: 5.7 % (ref 4.6–6.5)

## 2019-09-19 LAB — VITAMIN B12: Vitamin B-12: 276 pg/mL (ref 211–911)

## 2019-09-19 LAB — PSA: PSA: 2.35 ng/mL (ref 0.10–4.00)

## 2019-09-19 NOTE — Assessment & Plan Note (Signed)
stable overall by history and exam, recent data reviewed with pt, and pt to continue medical treatment as before,  to f/u any worsening symptoms or concerns  

## 2019-09-19 NOTE — Progress Notes (Signed)
Subjective:    Patient ID: Richard Roach, male    DOB: 04-Feb-1956, 64 y.o.   MRN: [id number]  HPIb Here for wellness and f/u;  Overall doing ok;  Pt denies Chest pain, worsening SOB, DOE, wheezing, orthopnea, PND, worsening LE edema, palpitations, dizziness or syncope.  Pt denies neurological change such as new headache, facial or extremity weakness.  Pt denies polydipsia, polyuria, or low sugar symptoms. Pt states overall good compliance with treatment and medications, good tolerability, and has been trying to follow appropriate diet.  Pt denies worsening depressive symptoms, suicidal ideation or panic. No fever, night sweats, wt loss, loss of appetite, or other constitutional symptoms.  Pt states good ability with ADL's, has low fall risk, home safety reviewed and adequate, no other significant changes in hearing or vision, and only occasionally active with exercise.  Past Medical History:  Diagnosis Date  . Allergic rhinitis   . Anxiety state 05/02/2015  . Chronic RLQ pain 05/02/2015  . Colon polyps   . Fatty liver   . GERD (gastroesophageal reflux disease)   . Hypercholesterolemia   . Hypertension   . Inguinal hernia   . Internal hemorrhoids   . Liver fibrosis 05/02/2015  . PVC's (premature ventricular contractions)   . Serrated adenoma of colon   . Sinus bradycardia    Past Surgical History:  Procedure Laterality Date  . KNEE ARTHROSCOPY Right June 2011  . KNEE ARTHROSCOPY Left 2012  . TONSILLECTOMY      reports that he quit smoking about 21 years ago. His smoking use included cigarettes. He has never used smokeless tobacco. He reports current alcohol use of about 14.0 standard drinks of alcohol per week. He reports that he does not use drugs. family history includes Asthma in his father; High blood pressure in his mother. No Known Allergies Current Outpatient Medications on File Prior to Visit  Medication Sig Dispense Refill  . metoprolol succinate (TOPROL-XL) 25 MG 24 hr tablet  Take 1 tablet (25 mg total) by mouth daily. Overdue for Annual appt w/labs must see provider for future refills 30 tablet 0  . rosuvastatin (CRESTOR) 20 MG tablet TAKE 1 TABLET(20 MG) BY MOUTH DAILY 90 tablet 1  . sildenafil (VIAGRA) 100 MG tablet TAKE 1/2 TO 1 TABLET BY MOUTH EVERY DAY AS NEEDED FOR ERECTILE DYSFUNCTION 5 tablet 0   No current facility-administered medications on file prior to visit.   Review of Systems All otherwise neg per pt     Objective:   Physical Exam BP 128/80 (BP Location: Left Arm, Patient Position: Sitting, Cuff Size: Large)   Pulse 61   Temp 98 F (36.7 C) (Oral)   Ht 5\' 10"  (1.778 m)   Wt 184 lb (83.5 kg)   SpO2 97%   BMI 26.40 kg/m  VS noted,  Constitutional: Pt appears in NAD HENT: Head: NCAT.  Right Ear: External ear normal.  Left Ear: External ear normal.  Eyes: . Pupils are equal, round, and reactive to light. Conjunctivae and EOM are normal Nose: without d/c or deformity Neck: Neck supple. Gross normal ROM Cardiovascular: Normal rate and regular rhythm.   Pulmonary/Chest: Effort normal and breath sounds without rales or wheezing.  Abd:  Soft, NT, ND, + BS, no organomegaly Neurological: Pt is alert. At baseline orientation, motor grossly intact Skin: Skin is warm. No rashes, other new lesions, no LE edema Psychiatric: Pt behavior is normal without agitation  All otherwise neg per pt Lab Results  Component Value Date  WBC 5.6 09/19/2019   HGB 16.4 09/19/2019   HCT 48.6 09/19/2019   PLT 220.0 09/19/2019   GLUCOSE 105 (H) 09/19/2019   CHOL 184 09/19/2019   TRIG 91.0 09/19/2019   HDL 57.50 09/19/2019   LDLDIRECT 164.0 04/18/2012   LDLCALC 109 (H) 09/19/2019   ALT 22 09/19/2019   AST 20 09/19/2019   NA 139 09/19/2019   K 4.4 09/19/2019   CL 103 09/19/2019   CREATININE 0.97 09/19/2019   BUN 18 09/19/2019   CO2 28 09/19/2019   TSH 1.49 09/19/2019   PSA 2.35 09/19/2019   HGBA1C 5.7 09/19/2019      Assessment & Plan:

## 2019-09-19 NOTE — Assessment & Plan Note (Signed)

## 2019-09-19 NOTE — Patient Instructions (Addendum)
We have discussed the Cardiac CT Score test to measure the calcification level (if any) in your heart arteries.  This test has been ordered in our [facility], so please call [facility] directly, as they prefer this, at [phone] to be scheduled.  Please continue all other medications as before, and refills have been done if requested.  Please have the pharmacy call with any other refills you may need.  Please continue your efforts at being more active, low cholesterol diet, and weight control.  You are otherwise up to date with prevention measures today.  Please keep your appointments with your specialists as you may have planned  Please go to the LAB at the blood drawing area for the tests to be done  You will be contacted by phone if any changes need to be made immediately.  Otherwise, you will receive a letter about your results with an explanation, but please check with MyChart first.  Please remember to sign up for MyChart if you have not done so, as this will be important to you in the future with finding out test results, communicating by private email, and scheduling acute appointments online when needed.  Please make an Appointment to return for your 1 year visit, or sooner if needed, with Lab testing by Appointment as well, to be done about 3-5 days before at the [facility] (so this is for TWO appointments - please see the scheduling desk as you leave)

## 2019-09-21 DIAGNOSIS — F419 Anxiety disorder, unspecified: Secondary | ICD-10-CM | POA: Diagnosis not present

## 2019-09-21 DIAGNOSIS — E785 Hyperlipidemia, unspecified: Secondary | ICD-10-CM | POA: Diagnosis not present

## 2019-09-21 DIAGNOSIS — K760 Fatty (change of) liver, not elsewhere classified: Secondary | ICD-10-CM | POA: Diagnosis not present

## 2019-09-21 DIAGNOSIS — F1020 Alcohol dependence, uncomplicated: Secondary | ICD-10-CM | POA: Diagnosis not present

## 2019-09-28 ENCOUNTER — Ambulatory Visit (INDEPENDENT_AMBULATORY_CARE_PROVIDER_SITE_OTHER)
Admission: RE | Admit: 2019-09-28 | Discharge: 2019-09-28 | Disposition: A | Discharge status: Home / Self Care | Payer: Self-pay | Source: Ambulatory Visit | Attending: Internal Medicine | Admitting: Internal Medicine

## 2019-09-28 ENCOUNTER — Other Ambulatory Visit: Payer: Self-pay

## 2019-09-28 ENCOUNTER — Encounter: Payer: Self-pay | Admitting: Internal Medicine

## 2019-09-28 DIAGNOSIS — E7800 Pure hypercholesterolemia, unspecified: Secondary | ICD-10-CM

## 2019-10-01 ENCOUNTER — Encounter: Payer: Self-pay | Admitting: Internal Medicine

## 2019-10-01 ENCOUNTER — Other Ambulatory Visit: Payer: Self-pay | Admitting: Internal Medicine

## 2019-10-03 NOTE — Telephone Encounter (Signed)
Verdis Frederickson - I dont see the final calcium score added to the ct cardiac score test,  Only the radiologist overread is listed.  Can we call radaiology to find out why this is not done, but be aware you may be referred to cardiology who is the one that does the actual ct cardiac score

## 2019-10-04 ENCOUNTER — Telehealth: Payer: Self-pay | Admitting: Cardiovascular Disease

## 2019-10-04 NOTE — Telephone Encounter (Signed)
   Pt is calling to schedule stress test and f/u with Dr. Audie Box. However no order for stress test yet  Please advise

## 2019-10-04 NOTE — Telephone Encounter (Signed)
Called patient- no order for stress test yet, will have patient see Dr.O'Neal on Friday to discuss further plans as patient is going out of town for the next few weeks and wanted this taken care of.   Thanks!

## 2019-10-05 NOTE — Progress Notes (Signed)
Cardiology Office Note:   Date:  10/06/2019  NAME:  Richard Roach    MRN: [id number] DOB:  [birth year]   PCP:  Biagio Borg, MD  Cardiologist:  Evalina Field, MD  Electrophysiologist:  None   Referring MD: Biagio Borg, MD   Chief Complaint  Patient presents with  . Coronary Artery Disease   History of Present Illness:   Richard Roach is a 64 y.o. male with a hx of CAD, HTN, HLD who presents for follow-up after recent coronary calcium score. 846, 98th percentile.  He reports he is doing well.  He has a a bit alarmed by his calcium score.  He presents with several questions regarding diet exercise and overall lifestyle approach to managing his CAD.  He reports that he has no symptoms of chest pain or shortness of breath.  He can walk 30 to 40 minutes without limitations.  He reports he did use the elliptical yesterday and had no chest pain after a 30-minute session.  He reports he can cut the grass without any limitations.  He reports he feels great but is very alarmed over this.  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  He reports working from home has been a little better but he has not done well with his diet.  He does report he is starting to look into things.  I did counsel him extensively about the best diet.  I recommended the Mediterranean diet.  Overall I recommended less red meat consumption and more green vegetables and lean meats.  I recommended less fast food intake.  I also recommended alcohol in moderation.  Stress appears to be a big issue here.  His blood pressure is 130/86.  He takes metoprolol.  That seems to be well controlled.  I did review his most recent lipid profile which shows an LDL cholesterol 109.  Given his coronary calcium score I recommended to increase his Crestor to 40 mg to get LDL less than 70.  He is in agreement with this plan.  He does use Viagra but this appears to be safe for him to use.  I did recommend that as well.  His cardiovascular examination is  benign.  I reviewed his EKG from January which shows normal sinus rhythm no acute ischemic changes or evidence of prior infarction.  He has a normal EKG.  He does have a history of fatty liver but this appears to be stable.  He denies chest pain, shortness of breath or palpitations today.  Problem List 1. CAD -CAC score 846 98th percentile 2. PVCs/palpitations (Holter 2016, 0.05% PVC burden) 3. ETT 04/2017: 12:36, 14.2 METs, no ischemia  4. Echo 65% 5. Fatty liver  6. Hyperlipidemia  -T chol 171, HDL 65, TG 69, LDL 93 -A1c 5.6   Past Medical History: Past Medical History:  Diagnosis Date  . Allergic rhinitis   . Anxiety state 05/02/2015  . Chronic RLQ pain 05/02/2015  . Colon polyps   . Coronary artery disease   . Fatty liver   . GERD (gastroesophageal reflux disease)   . Hypercholesterolemia   . Hypertension   . Inguinal hernia   . Internal hemorrhoids   . Liver fibrosis 05/02/2015  . PVC's (premature ventricular contractions)   . Serrated adenoma of colon   . Sinus bradycardia     Past Surgical History: Past Surgical History:  Procedure Laterality Date  . KNEE ARTHROSCOPY Right June 2011  . KNEE ARTHROSCOPY Left  2012  . TONSILLECTOMY      Current Medications: Current Meds  Medication Sig  . Bioflavonoid Products (VITAMIN C) CHEW Chew by mouth.  . Cholecalciferol (D3-1000 PO) Take by mouth.  . metoprolol succinate (TOPROL-XL) 25 MG 24 hr tablet TAKE 1 TABLET BY MOUTH DAILY; OVERDUE FOR ANNUAL APPOINTMENT WITH LABS; MUST SEE PROVIDER FOR FUTURE REFILLS  . rosuvastatin (CRESTOR) 40 MG tablet Take 1 tablet (40 mg total) by mouth daily.  . sildenafil (VIAGRA) 100 MG tablet TAKE 1/2 TO 1 TABLET BY MOUTH EVERY DAY AS NEEDED FOR ERECTILE DYSFUNCTION  . [DISCONTINUED] rosuvastatin (CRESTOR) 20 MG tablet TAKE 1 TABLET(20 MG) BY MOUTH DAILY     Allergies:    Patient has no known allergies.   Social History: Social History   Socioeconomic History  . Marital status:  Married    Spouse name: Not on file  . Number of children: 2  . Years of education: Not on file  . Highest education level: Not on file  Occupational History  . Occupation: Leisure centre manager  Tobacco Use  . Smoking status: Former Smoker    Types: Cigarettes    Quit date: 05/11/1998    Years since quitting: 21.4  . Smokeless tobacco: Never Used  . Tobacco comment: smoked only socially x 5 yrs  Substance and Sexual Activity  . Alcohol use: Yes    Alcohol/week: 14.0 standard drinks    Types: 14 Glasses of wine per week  . Drug use: No  . Sexual activity: Yes  Other Topics Concern  . 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   Social Determinants of Health   Financial Resource Strain:   . Difficulty of Paying Living Expenses:   Food Insecurity:   . Worried About Charity fundraiser in the Last Year:   . Arboriculturist in the Last Year:   Transportation Needs:   . Film/video editor (Medical):   Marland Kitchen Lack of Transportation (Non-Medical):   Physical Activity:   . Days of Exercise per Week:   . Minutes of Exercise per Session:   Stress:   . Feeling of Stress :   Social Connections:   . Frequency of Communication with Friends and Family:   . Frequency of Social Gatherings with Friends and Family:   . Attends Religious Services:   . Active Member of Clubs or Organizations:   . Attends Archivist Meetings:   Marland Kitchen Marital Status:      Family History: The patient's family history includes Asthma in his father; High blood pressure in his mother. There is no history of Colon cancer, Colon polyps, Diabetes, Kidney disease, Esophageal cancer, Liver cancer, Rectal cancer, or Stomach cancer.  ROS:   All other ROS reviewed and negative. Pertinent positives noted in the HPI.     EKGs/Labs/Other Studies Reviewed:   The following studies were personally reviewed by me today:  Cardiac CT CAC  score 09/28/2019 IMPRESSION: Coronary calcium score of 846. This was 98th percentile for age and sex matched controls.  Recent Labs: 09/19/2019: ALT 22; BUN 18; Creatinine, Ser 0.97; Hemoglobin 16.4; Platelets 220.0; Potassium 4.4; Sodium 139; TSH 1.49   Recent Lipid Panel    Component Value Date/Time   CHOL 184 09/19/2019 0937   CHOL 171 05/22/2019 0811   TRIG 91.0 09/19/2019 0937   HDL 57.50 09/19/2019 0937   HDL 65  05/22/2019 0811   CHOLHDL 3 09/19/2019 0937   VLDL 18.2 09/19/2019 0937   LDLCALC 109 (H) 09/19/2019 0937   LDLCALC 93 05/22/2019 0811   LDLDIRECT 164.0 04/18/2012 0930    Physical Exam:   VS:  BP 130/86   Pulse 66   Ht 5\' 10"  (1.778 m)   Wt 184 lb 6.4 oz (83.6 kg)   SpO2 98%   BMI 26.46 kg/m    Wt Readings from Last 3 Encounters:  10/06/19 184 lb 6.4 oz (83.6 kg)  09/19/19 184 lb (83.5 kg)  05/18/19 186 lb 3.2 oz (84.5 kg)    General: Well nourished, well developed, in no acute distress Heart: Atraumatic, normal size  Eyes: PEERLA, EOMI  Neck: Supple, no JVD Endocrine: No thryomegaly Cardiac: Normal S1, S2; RRR; no murmurs, rubs, or gallops Lungs: Clear to auscultation bilaterally, no wheezing, rhonchi or rales  Abd: Soft, nontender, no hepatomegaly  Ext: No edema, pulses 2+ Musculoskeletal: No deformities, BUE and BLE strength normal and equal Skin: Warm and dry, no rashes   Neuro: Alert and oriented to person, place, time, and situation, CNII-XII grossly intact, no focal deficits  Psych: Normal mood and affect   ASSESSMENT:   Richard Roach is a 64 y.o. male who presents for the following: 1. Coronary artery disease involving native coronary artery of native heart without angina pectoris   2. Mixed hyperlipidemia   3. Essential hypertension     PLAN:   1. Coronary artery disease involving native coronary artery of native heart without angina pectoris -CAC score 846 (98th percentile).  No symptoms.  EKG normal from January.  Given his high calcium  score recommend an exercise treadmill stress test.  I did counsel him extensively on what a calcium score means.  He has no symptoms of chest pain or shortness of breath.  I highly doubt he has obstructive CAD.  I think a stress test will help him calm down and further reassure him that he is okay.  I recommended a increased in vegetables.  I also recommended reduction in red meats.  He will pursue lean meats.  I recommended alcohol moderation.  His most recent LDL cholesterol is not at goal.  I recommended to increase his Crestor to 40 mg daily.  We will plan to recheck a lipid profile fasting 1 week before an appointment in 2 months and see him back.  We will go over the results of his stress as well as his cholesterol profile at that time.  I also recommended aspirin 81 mg daily.  He will do this.  His blood pressure is well controlled.  I have recommended 150 minutes of exercise per week.  This will include moderate to vigorous physical activity.  He will do this.  He can do as much as he likes.  He has no limitations with activity.  He can do any activity he likes.  He does have erectile dysfunction and takes Viagra.  There is no indication he is not need to take this.  2. Mixed hyperlipidemia -Increase Crestor to 40 mg daily.  Recheck lipid profile in 2 months.  3. Essential hypertension -Well-controlled metoprolol.  Disposition: Return in about 2 months (around 12/06/2019).  Medication Adjustments/Labs and Tests Ordered: Current medicines are reviewed at length with the patient today.  Concerns regarding medicines are outlined above.  Orders Placed This Encounter  Procedures  . Lipid panel  . EXERCISE TOLERANCE TEST (ETT)   Meds ordered this encounter  Medications  .  rosuvastatin (CRESTOR) 40 MG tablet    Sig: Take 1 tablet (40 mg total) by mouth daily.    Dispense:  90 tablet    Refill:  1  . aspirin EC 81 MG tablet    Sig: Take 1 tablet (81 mg total) by mouth daily.    Dispense:  90  tablet    Refill:  3    Patient Instructions  Medication Instructions:  Increase Crestor to 40 mg daily  Start taking Aspirin 81 mg daily   *If you need a refill on your cardiac medications before your next appointment, please call your pharmacy*   Lab Work: LIPID (recheck in 1 week before the 2 month follow up appointment, no appointment needed for lab work, come fasting, nothing to eat or drink)  COVID TESTING NEEDED  If you have labs (blood work) drawn today and your tests are completely normal, you will receive your results only by: Marland Kitchen MyChart Message (if you have MyChart) OR . A paper copy in the mail If you have any lab test that is abnormal or we need to change your treatment, we will call you to review the results.   Testing/Procedures: Your physician has requested that you have an exercise tolerance test, this is a screening tool to track your fitness level. This test evaluates the your exercise capacity by measuring cardiovascular response to exercise, the stress response is induced by exercise (exercise-treadmill).  Graded exercise test is also known as maximal exercise test or stress EKG test  . Please also follow instruction sheet given.   Follow-Up: At [facility], you and your health needs are our priority.  As part of our continuing mission to provide you with exceptional heart care, we have created designated Provider Care Teams.  These Care Teams include your primary Cardiologist (physician) and Advanced Practice Providers (APPs -  Physician Assistants and Nurse Practitioners) who all work together to provide you with the care you need, when you need it.  We recommend signing up for the patient portal called "MyChart".  Sign up information is provided on this After Visit Summary.  MyChart is used to connect with patients for Virtual Visits (Telemedicine).  Patients are able to view lab/test results, encounter notes, upcoming appointments, etc.  Non-urgent messages can  be sent to your provider as well.   To learn more about what you can do with MyChart, go to [email or web address].    Your next appointment:   2 month(s)  The format for your next appointment:   In Person  Provider:   Eleonore Chiquito, MD         Time Spent with Patient: I have spent a total of 35 minutes with patient reviewing hospital notes, telemetry, EKGs, labs and examining the patient as well as establishing an assessment and plan that was discussed with the patient.  > 50% of time was spent in direct patient care.  Signed, Addison Naegeli. Audie Box, [place]  [address] [place], [place] [address] [phone]  10/06/2019 10:25 AM

## 2019-10-06 ENCOUNTER — Other Ambulatory Visit: Payer: Self-pay

## 2019-10-06 ENCOUNTER — Ambulatory Visit: Payer: BC Managed Care – PPO | Admitting: Cardiovascular Disease

## 2019-10-06 ENCOUNTER — Encounter: Payer: Self-pay | Admitting: Cardiovascular Disease

## 2019-10-06 VITALS — BP 130/86 | HR 66 | Ht 70.0 in | Wt 184.4 lb

## 2019-10-06 DIAGNOSIS — E782 Mixed hyperlipidemia: Secondary | ICD-10-CM | POA: Diagnosis not present

## 2019-10-06 DIAGNOSIS — I2510 Atherosclerotic heart disease of native coronary artery without angina pectoris: Secondary | ICD-10-CM

## 2019-10-06 DIAGNOSIS — I10 Essential (primary) hypertension: Secondary | ICD-10-CM | POA: Diagnosis not present

## 2019-10-06 MED ORDER — ROSUVASTATIN CALCIUM 40 MG PO TABS: 40.0000 mg | ORAL_TABLET | Freq: Every day | ORAL | 1 refills | Status: DC

## 2019-10-06 MED ORDER — ASPIRIN EC 81 MG PO TBEC: 81.0000 mg | DELAYED_RELEASE_TABLET | Freq: Every day | ORAL | 3 refills | Status: AC

## 2019-10-06 NOTE — Patient Instructions (Addendum)
Medication Instructions:  Increase Crestor to 40 mg daily  Start taking Aspirin 81 mg daily   *If you need a refill on your cardiac medications before your next appointment, please call your pharmacy*   Lab Work: LIPID (recheck in 1 week before the 2 month follow up appointment, no appointment needed for lab work, come fasting, nothing to eat or drink)  COVID TESTING NEEDED  If you have labs (blood work) drawn today and your tests are completely normal, you will receive your results only by: Marland Kitchen MyChart Message (if you have MyChart) OR . A paper copy in the mail If you have any lab test that is abnormal or we need to change your treatment, we will call you to review the results.   Testing/Procedures: Your physician has requested that you have an exercise tolerance test, this is a screening tool to track your fitness level. This test evaluates the your exercise capacity by measuring cardiovascular response to exercise, the stress response is induced by exercise (exercise-treadmill).  Graded exercise test is also known as maximal exercise test or stress EKG test  . Please also follow instruction sheet given.   Follow-Up: At [facility], you and your health needs are our priority.  As part of our continuing mission to provide you with exceptional heart care, we have created designated Provider Care Teams.  These Care Teams include your primary Cardiologist (physician) and Advanced Practice Providers (APPs -  Physician Assistants and Nurse Practitioners) who all work together to provide you with the care you need, when you need it.  We recommend signing up for the patient portal called "MyChart".  Sign up information is provided on this After Visit Summary.  MyChart is used to connect with patients for Virtual Visits (Telemedicine).  Patients are able to view lab/test results, encounter notes, upcoming appointments, etc.  Non-urgent messages can be sent to your provider as well.   To learn more  about what you can do with MyChart, go to [email or web address].    Your next appointment:   2 month(s)  The format for your next appointment:   In Person  Provider:   Eleonore Chiquito, MD

## 2019-10-19 ENCOUNTER — Telehealth (HOSPITAL_COMMUNITY): Payer: Self-pay

## 2019-10-19 NOTE — Telephone Encounter (Signed)
Encounter complete. 

## 2019-10-20 ENCOUNTER — Other Ambulatory Visit (HOSPITAL_COMMUNITY): Payer: BC Managed Care – PPO

## 2019-10-24 ENCOUNTER — Other Ambulatory Visit: Payer: Self-pay

## 2019-10-24 ENCOUNTER — Ambulatory Visit (HOSPITAL_COMMUNITY)
Admission: RE | Admit: 2019-10-24 | Discharge: 2019-10-24 | Disposition: A | Discharge status: Home / Self Care | Payer: BC Managed Care – PPO | Source: Ambulatory Visit | Attending: Cardiology | Admitting: Cardiology

## 2019-10-24 DIAGNOSIS — I2510 Atherosclerotic heart disease of native coronary artery without angina pectoris: Secondary | ICD-10-CM | POA: Diagnosis not present

## 2019-10-24 LAB — EXERCISE TOLERANCE TEST
Estimated workload: 13.7 METS
Exercise duration (min): 12 min
Exercise duration (sec): 5 s
MPHR: 158 {beats}/min
Peak HR: 137 {beats}/min
Percent HR: 86 %
Rest HR: 59 {beats}/min

## 2019-10-27 DIAGNOSIS — L821 Other seborrheic keratosis: Secondary | ICD-10-CM | POA: Diagnosis not present

## 2019-10-27 DIAGNOSIS — L814 Other melanin hyperpigmentation: Secondary | ICD-10-CM | POA: Diagnosis not present

## 2019-11-07 DIAGNOSIS — N401 Enlarged prostate with lower urinary tract symptoms: Secondary | ICD-10-CM | POA: Diagnosis not present

## 2019-11-07 DIAGNOSIS — R351 Nocturia: Secondary | ICD-10-CM | POA: Diagnosis not present

## 2019-11-08 ENCOUNTER — Encounter: Payer: Self-pay | Admitting: Internal Medicine

## 2019-11-09 ENCOUNTER — Other Ambulatory Visit: Payer: Self-pay | Admitting: Internal Medicine

## 2019-11-09 NOTE — Telephone Encounter (Signed)
Please refill as per office routine med refill policy (all routine meds refilled for 3 mo or monthly per pt preference up to one year from last visit, then month to month grace period for 3 mo, then further med refills will have to be denied)  

## 2019-11-27 DIAGNOSIS — E782 Mixed hyperlipidemia: Secondary | ICD-10-CM | POA: Diagnosis not present

## 2019-11-27 LAB — LIPID PANEL
Chol/HDL Ratio: 2.5 ratio (ref 0.0–5.0)
Cholesterol, Total: 136 mg/dL (ref 100–199)
HDL: 55 mg/dL (ref >39)
LDL Chol Calc (NIH): 68 mg/dL (ref 0–99)
Triglycerides: 63 mg/dL (ref 0–149)
VLDL Cholesterol Cal: 13 mg/dL (ref 5–40)

## 2019-12-01 ENCOUNTER — Encounter: Payer: Self-pay | Admitting: Internal Medicine

## 2019-12-03 NOTE — Progress Notes (Signed)
Cardiology Office Note:   Date:  12/06/2019  NAME:  Richard Roach    MRN: [id number] DOB:  [birth year]   PCP:  Biagio Borg, MD  Cardiologist:  Evalina Field, MD  Electrophysiologist:  None   Referring MD: Biagio Borg, MD   Chief Complaint  Patient presents with  . Coronary Artery Disease   History of Present Illness:   Richard Roach is a 64 y.o. male with a hx of CAD (elevated coronary calcium score), normal exercise treadmill stress test, fatty liver, hyperlipidemia who presents for follow-up. He reports he is change his diet considerably.  He is exercising 30 minutes/day on a treadmill.  He is actually lost 5 pounds since her last visit.  He is also made considerable strides in reducing his stress level.  He denies any chest pain or shortness of breath with his current level activity.  He does report some dry mouth with the higher dose of Crestor but this does not bother him that much.  Mainly occurs in the morning.  He seems to be doing well and is at goal based on his most recent lipid profile.  We did discuss that he is at optimal level for blood pressure and cholesterol.  His risk of future plaque progression or development of heart attack or stroke is probably the lowest it will be.  Problem List 1. CAD -CAC score 846 98th percentile 2. PVCs/palpitations (Holter 2016, 0.05% PVC burden) 3. ETT 10/24/2019: 12:05 min:s; 13.7 METS  4. Echo 65% 5. Fatty liver  6. Hyperlipidemia -T chol 136, HDL 55, LDL 68, TG 63 -A1c 5.6  Past Medical History: Past Medical History:  Diagnosis Date  . Allergic rhinitis   . Anxiety state 05/02/2015  . Chronic RLQ pain 05/02/2015  . Colon polyps   . Coronary artery disease   . Fatty liver   . GERD (gastroesophageal reflux disease)   . Hypercholesterolemia   . Hypertension   . Inguinal hernia   . Internal hemorrhoids   . Liver fibrosis 05/02/2015  . PVC's (premature ventricular contractions)   . Serrated adenoma of colon   . Sinus bradycardia      Past Surgical History: Past Surgical History:  Procedure Laterality Date  . KNEE ARTHROSCOPY Right June 2011  . KNEE ARTHROSCOPY Left 2012  . TONSILLECTOMY      Current Medications: Current Meds  Medication Sig  . aspirin EC 81 MG tablet Take 1 tablet (81 mg total) by mouth daily.  Marland Kitchen Bioflavonoid Products (VITAMIN C) CHEW Chew by mouth.  . Cholecalciferol (D3-1000 PO) Take by mouth.  . metoprolol succinate (TOPROL-XL) 25 MG 24 hr tablet Take 1 tablet (25 mg total) by mouth daily.  . rosuvastatin (CRESTOR) 40 MG tablet Take 1 tablet (40 mg total) by mouth daily.  . sildenafil (VIAGRA) 100 MG tablet TAKE 1/2 TO 1 TABLET BY MOUTH EVERY DAY AS NEEDED FOR ERECTILE DYSFUNCTION     Allergies:    Patient has no known allergies.   Social History: Social History   Socioeconomic History  . Marital status: Married    Spouse name: Not on file  . Number of children: 2  . Years of education: Not on file  . Highest education level: Not on file  Occupational History  . Occupation: Leisure centre manager  Tobacco Use  . Smoking status: Former Smoker    Types: Cigarettes    Quit date: 05/11/1998    Years since quitting: 21.5  . Smokeless tobacco: Never  Used  . Tobacco comment: smoked only socially x 5 yrs  Substance and Sexual Activity  . Alcohol use: Yes    Alcohol/week: 14.0 standard drinks    Types: 14 Glasses of wine per week  . Drug use: No  . Sexual activity: Yes  Other Topics Concern  . 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   Social Determinants of Health   Financial Resource Strain:   . Difficulty of Paying Living Expenses:   Food Insecurity:   . Worried About Charity fundraiser in the Last Year:   . Arboriculturist in the Last Year:   Transportation Needs:   . Film/video editor (Medical):   Marland Kitchen Lack of Transportation (Non-Medical):   Physical Activity:   . Days of Exercise per  Week:   . Minutes of Exercise per Session:   Stress:   . Feeling of Stress :   Social Connections:   . Frequency of Communication with Friends and Family:   . Frequency of Social Gatherings with Friends and Family:   . Attends Religious Services:   . Active Member of Clubs or Organizations:   . Attends Archivist Meetings:   Marland Kitchen Marital Status:      Family History: The patient's family history includes Asthma in his father; High blood pressure in his mother. There is no history of Colon cancer, Colon polyps, Diabetes, Kidney disease, Esophageal cancer, Liver cancer, Rectal cancer, or Stomach cancer.  ROS:   All other ROS reviewed and negative. Pertinent positives noted in the HPI.     EKGs/Labs/Other Studies Reviewed:   The following studies were personally reviewed by me today:  ETT 10/24/2019  There was no ST segment deviation noted during stress.  Negative exercise treadmill test with no ischemia.  The patient exercised for 12 minutes and 5 seconds reaching 86% PMHR.  Excellent exercise capacity reaching 13.7 mets.  This is a low risk study.   CT CAC score 10/04/2019 Coronary calcium score of 846. This was 98th percentile for age and sex matched controls.  Recent Labs: 09/19/2019: ALT 22; BUN 18; Creatinine, Ser 0.97; Hemoglobin 16.4; Platelets 220.0; Potassium 4.4; Sodium 139; TSH 1.49   Recent Lipid Panel    Component Value Date/Time   CHOL 136 11/27/2019 0824   TRIG 63 11/27/2019 0824   HDL 55 11/27/2019 0824   CHOLHDL 2.5 11/27/2019 0824   CHOLHDL 3 09/19/2019 0937   VLDL 18.2 09/19/2019 0937   LDLCALC 68 11/27/2019 0824   LDLDIRECT 164.0 04/18/2012 0930    Physical Exam:   VS:  BP (!) 130/84   Pulse 56   Ht 5\' 10"  (1.778 m)   Wt 179 lb 6.4 oz (81.4 kg)   SpO2 99%   BMI 25.74 kg/m    Wt Readings from Last 3 Encounters:  12/06/19 179 lb 6.4 oz (81.4 kg)  10/06/19 184 lb 6.4 oz (83.6 kg)  09/19/19 184 lb (83.5 kg)    General: Well  nourished, well developed, in no acute distress Heart: Atraumatic, normal size  Eyes: PEERLA, EOMI  Neck: Supple, no JVD Endocrine: No thryomegaly Cardiac: Normal S1, S2; RRR; no murmurs, rubs, or gallops Lungs: Clear to auscultation bilaterally, no wheezing, rhonchi or rales  Abd: Soft, nontender, no hepatomegaly  Ext: No edema, pulses 2+ Musculoskeletal: No deformities, BUE and BLE strength normal and equal Skin: Warm and dry,  no rashes   Neuro: Alert and oriented to person, place, time, and situation, CNII-XII grossly intact, no focal deficits  Psych: Normal mood and affect   ASSESSMENT:   Richard Roach is a 64 y.o. male who presents for the following: 1. Coronary artery disease involving native coronary artery of native heart without angina pectoris   2. Mixed hyperlipidemia     PLAN:   1. Coronary artery disease involving native coronary artery of native heart without angina pectoris -Coronary calcium score in the 90th percentile.  Recent exercise treadmill stress test with 12 minutes on the treadmill without any ischemic changes.  He has no chest pain or shortness of breath.  He is on aspirin.  His most recent Crestor is 40 mg daily.  Most recent LDL cholesterol 68.  He is at goal.  Blood pressure is 130/84 today.  At goal.  He is doing well overall.  I have encouraged him to maintain a healthy lifestyle of diet and exercise before.  We will see him yearly.  2. Mixed hyperlipidemia -Most recent Crestor dose 40 mg daily.  LDL cholesterol 68.  At goal.  No change to medications.  We will see him yearly.  Disposition: Return in about 1 year (around 12/05/2020).  Medication Adjustments/Labs and Tests Ordered: Current medicines are reviewed at length with the patient today.  Concerns regarding medicines are outlined above.  No orders of the defined types were placed in this encounter.  No orders of the defined types were placed in this encounter.   Patient Instructions  Medication  Instructions:  The current medical regimen is effective;  continue present plan and medications.  *If you need a refill on your cardiac medications before your next appointment, please call your pharmacy*   Follow-Up: At [facility], you and your health needs are our priority.  As part of our continuing mission to provide you with exceptional heart care, we have created designated Provider Care Teams.  These Care Teams include your primary Cardiologist (physician) and Advanced Practice Providers (APPs -  Physician Assistants and Nurse Practitioners) who all work together to provide you with the care you need, when you need it.  We recommend signing up for the patient portal called "MyChart".  Sign up information is provided on this After Visit Summary.  MyChart is used to connect with patients for Virtual Visits (Telemedicine).  Patients are able to view lab/test results, encounter notes, upcoming appointments, etc.  Non-urgent messages can be sent to your provider as well.   To learn more about what you can do with MyChart, go to [email or web address].    Your next appointment:   12 month(s)  The format for your next appointment:   In Person  Provider:   Eleonore Chiquito, MD         Time Spent with Patient: I have spent a total of 25 minutes with patient reviewing hospital notes, telemetry, EKGs, labs and examining the patient as well as establishing an assessment and plan that was discussed with the patient.  > 50% of time was spent in direct patient care.  Signed, Addison Naegeli. Audie Box, [place]  [address] [place], [place] [address] [phone]  12/06/2019 8:19 AM

## 2019-12-06 ENCOUNTER — Encounter: Payer: Self-pay | Admitting: Cardiovascular Disease

## 2019-12-06 ENCOUNTER — Ambulatory Visit: Payer: BC Managed Care – PPO | Admitting: Cardiovascular Disease

## 2019-12-06 ENCOUNTER — Other Ambulatory Visit: Payer: Self-pay

## 2019-12-06 VITALS — BP 130/84 | HR 56 | Ht 70.0 in | Wt 179.4 lb

## 2019-12-06 DIAGNOSIS — E782 Mixed hyperlipidemia: Secondary | ICD-10-CM

## 2019-12-06 DIAGNOSIS — I2510 Atherosclerotic heart disease of native coronary artery without angina pectoris: Secondary | ICD-10-CM

## 2019-12-06 NOTE — Patient Instructions (Signed)

## 2019-12-11 ENCOUNTER — Telehealth: Payer: Self-pay

## 2019-12-11 NOTE — Telephone Encounter (Signed)
Patient calling and would like to know if he has had a shingles vaccine? Thinks that he has but is unsure. Please advise.

## 2019-12-14 NOTE — Telephone Encounter (Signed)
LDVM for pt informing him that we do not have any record of him having a Shingles shot.

## 2019-12-21 ENCOUNTER — Other Ambulatory Visit: Payer: BC Managed Care – PPO

## 2019-12-21 ENCOUNTER — Telehealth: Payer: Self-pay | Admitting: Nurse Practitioner

## 2019-12-21 ENCOUNTER — Other Ambulatory Visit: Payer: Self-pay | Admitting: Nurse Practitioner

## 2019-12-21 ENCOUNTER — Other Ambulatory Visit: Payer: Self-pay

## 2019-12-21 DIAGNOSIS — U071 COVID-19: Secondary | ICD-10-CM

## 2019-12-21 DIAGNOSIS — Z20822 Contact with and (suspected) exposure to covid-19: Secondary | ICD-10-CM | POA: Diagnosis not present

## 2019-12-21 DIAGNOSIS — I493 Ventricular premature depolarization: Secondary | ICD-10-CM

## 2019-12-21 DIAGNOSIS — I10 Essential (primary) hypertension: Secondary | ICD-10-CM

## 2019-12-21 NOTE — Telephone Encounter (Signed)
Called to discuss with Richard Roach about Covid symptoms and the use of casirivimab/imdevimab, a combination monoclonal antibody infusion for those with mild to moderate Covid symptoms and at a high risk of hospitalization.     Pt is qualified for this infusion at the [facility] infusion center due to co-morbid conditions as indicated below.   He verbalized understanding of infusion and appointment details. He will bring home test results to appointment scheduled for 12/23/19 @ 0830.    Patient Active Problem List   Diagnosis Date Noted  . Right-sided chest pain 12/14/2017  . Groin lump 09/09/2017  . Hyperglycemia 04/28/2017  . Chest pain 04/28/2017  . Cough 03/11/2017  . Abnormal breath sounds 03/11/2017  . Upper respiratory tract infection 01/05/2017  . Erectile dysfunction 04/24/2016  . Anxiety state 05/02/2015  . Liver fibrosis 05/02/2015  . Chronic RLQ pain 05/02/2015  . BPH (benign prostatic hypertrophy) 05/02/2015  . Hypercholesterolemia   . Colon polyps   . Serrated adenoma of colon   . PVC's (premature ventricular contractions)   . Fatty liver 03/14/2014  . Preventative health care 04/25/2013  . Palpitations 04/17/2013  . Impacted cerumen of right ear 09/24/2012  . Abdominal pain 12/06/2010  . HYPERHIDROSIS 04/30/2009  . Essential hypertension 06/09/2007  . Allergic rhinitis 06/09/2007    Alda Lea, AGPCNP-BC

## 2019-12-21 NOTE — Progress Notes (Signed)
I connected by phone with Richard Roach on 12/21/2019 at 12:19 PM to discuss the potential use of a new treatment for mild to moderate COVID-19 viral infection in non-hospitalized patients.  This patient is a 64 y.o. male that meets the FDA criteria for Emergency Use Authorization of COVID monoclonal antibody casirivimab/imdevimab.  Has a (+) direct SARS-CoV-2 viral test result  Has mild or moderate COVID-19   Is NOT hospitalized due to COVID-19  Is within 10 days of symptom onset  Has at least one of the high risk factor(s) for progression to severe COVID-19 and/or hospitalization as defined in EUA.  Specific high risk criteria : Cardiovascular disease or hypertension, BMI   I have spoken and communicated the following to the patient or parent/caregiver regarding COVID monoclonal antibody treatment:  1. FDA has authorized the emergency use for the treatment of mild to moderate COVID-19 in adults and pediatric patients with positive results of direct SARS-CoV-2 viral testing who are 61 years of age and older weighing at least 40 kg, and who are at high risk for progressing to severe COVID-19 and/or hospitalization.  2. The significant known and potential risks and benefits of COVID monoclonal antibody, and the extent to which such potential risks and benefits are unknown.  3. Information on available alternative treatments and the risks and benefits of those alternatives, including clinical trials.  4. Patients treated with COVID monoclonal antibody should continue to self-isolate and use infection control measures (e.g., wear mask, isolate, social distance, avoid sharing personal items, clean and disinfect "high touch" surfaces, and frequent handwashing) according to CDC guidelines.   5. The patient or parent/caregiver has the option to accept or refuse COVID monoclonal antibody treatment.  After reviewing this information with the patient, The patient agreed to proceed with receiving  casirivimab\imdevimab infusion and will be provided a copy of the Fact sheet prior to receiving the infusion. Richard Roach 12/21/2019 12:19 PM

## 2019-12-22 ENCOUNTER — Telehealth: Payer: BC Managed Care – PPO | Admitting: Internal Medicine

## 2019-12-22 ENCOUNTER — Ambulatory Visit (HOSPITAL_COMMUNITY): Payer: Self-pay

## 2019-12-22 ENCOUNTER — Ambulatory Visit (HOSPITAL_COMMUNITY)
Admission: RE | Admit: 2019-12-22 | Discharge: 2019-12-22 | Disposition: A | Discharge status: Home / Self Care | Payer: BC Managed Care – PPO | Source: Ambulatory Visit | Attending: Pulmonary Disease | Admitting: Pulmonary Disease

## 2019-12-22 DIAGNOSIS — I493 Ventricular premature depolarization: Secondary | ICD-10-CM | POA: Diagnosis not present

## 2019-12-22 DIAGNOSIS — U071 COVID-19: Secondary | ICD-10-CM | POA: Diagnosis not present

## 2019-12-22 DIAGNOSIS — I10 Essential (primary) hypertension: Secondary | ICD-10-CM

## 2019-12-22 MED ORDER — METHYLPREDNISOLONE SODIUM SUCC 125 MG IJ SOLR: 125.0000 mg | Freq: Once | INTRAMUSCULAR | Status: DC | PRN

## 2019-12-22 MED ORDER — SODIUM CHLORIDE 0.9 % IV SOLN: INTRAVENOUS | Status: DC | PRN

## 2019-12-22 MED ORDER — DIPHENHYDRAMINE HCL 50 MG/ML IJ SOLN: 50.0000 mg | Freq: Once | INTRAMUSCULAR | Status: DC | PRN

## 2019-12-22 MED ORDER — SODIUM CHLORIDE 0.9 % IV SOLN
1200.0000 mg | Freq: Once | INTRAVENOUS | Status: AC
  Administered 2019-12-22: 1200 mg via INTRAVENOUS
  Filled 2019-12-22: qty 1200

## 2019-12-22 MED ORDER — FAMOTIDINE IN NACL 20-0.9 MG/50ML-% IV SOLN: 20.0000 mg | Freq: Once | INTRAVENOUS | Status: DC | PRN

## 2019-12-22 MED ORDER — ALBUTEROL SULFATE HFA 108 (90 BASE) MCG/ACT IN AERS: 2.0000 | INHALATION_SPRAY | Freq: Once | RESPIRATORY_TRACT | Status: DC | PRN

## 2019-12-22 MED ORDER — EPINEPHRINE 0.3 MG/0.3ML IJ SOAJ: 0.3000 mg | Freq: Once | INTRAMUSCULAR | Status: DC | PRN

## 2019-12-22 NOTE — Discharge Instructions (Signed)

## 2019-12-22 NOTE — Progress Notes (Signed)
  Diagnosis: COVID-19  Physician: Dr. Joya Gaskins  Procedure: [facility] Infusion Clinic Med: casirivimab\imdevimab infusion - Provided patient with casirivimab\imdevimab fact sheet for patients, parents and caregivers prior to infusion.  Complications: No immediate complications noted.  Discharge: Discharged home   [place] 12/22/2019

## 2019-12-23 ENCOUNTER — Ambulatory Visit (HOSPITAL_COMMUNITY): Payer: BC Managed Care – PPO

## 2019-12-23 LAB — SARS-COV-2, NAA 2 DAY TAT

## 2019-12-23 LAB — NOVEL CORONAVIRUS, NAA: SARS-CoV-2, NAA: DETECTED — AB

## 2020-01-12 ENCOUNTER — Other Ambulatory Visit: Payer: Self-pay | Admitting: Internal Medicine

## 2020-02-19 ENCOUNTER — Telehealth: Payer: Self-pay

## 2020-02-19 NOTE — Telephone Encounter (Signed)
Patient called on 10/9 stating he was having back pain that was excruciating. Currently using ibuprofen for relief. Pain located in lower left side of back.

## 2020-04-01 DIAGNOSIS — B351 Tinea unguium: Secondary | ICD-10-CM | POA: Diagnosis not present

## 2020-04-01 DIAGNOSIS — L03031 Cellulitis of right toe: Secondary | ICD-10-CM | POA: Diagnosis not present

## 2020-04-01 DIAGNOSIS — L600 Ingrowing nail: Secondary | ICD-10-CM | POA: Diagnosis not present

## 2020-04-01 DIAGNOSIS — M792 Neuralgia and neuritis, unspecified: Secondary | ICD-10-CM | POA: Diagnosis not present

## 2020-04-11 ENCOUNTER — Other Ambulatory Visit: Payer: Self-pay | Admitting: Cardiovascular Disease

## 2020-04-17 DIAGNOSIS — B351 Tinea unguium: Secondary | ICD-10-CM | POA: Diagnosis not present

## 2020-04-18 DIAGNOSIS — M792 Neuralgia and neuritis, unspecified: Secondary | ICD-10-CM | POA: Diagnosis not present

## 2020-04-18 DIAGNOSIS — L03031 Cellulitis of right toe: Secondary | ICD-10-CM | POA: Diagnosis not present

## 2020-04-18 DIAGNOSIS — B351 Tinea unguium: Secondary | ICD-10-CM | POA: Diagnosis not present

## 2020-04-18 DIAGNOSIS — L600 Ingrowing nail: Secondary | ICD-10-CM | POA: Diagnosis not present

## 2020-04-29 DIAGNOSIS — H43392 Other vitreous opacities, left eye: Secondary | ICD-10-CM | POA: Diagnosis not present

## 2020-04-29 DIAGNOSIS — H43812 Vitreous degeneration, left eye: Secondary | ICD-10-CM | POA: Diagnosis not present

## 2020-06-14 DIAGNOSIS — R1031 Right lower quadrant pain: Secondary | ICD-10-CM | POA: Diagnosis not present

## 2020-06-14 DIAGNOSIS — K760 Fatty (change of) liver, not elsewhere classified: Secondary | ICD-10-CM | POA: Diagnosis not present

## 2020-07-11 ENCOUNTER — Other Ambulatory Visit: Payer: Self-pay

## 2020-07-11 ENCOUNTER — Encounter: Payer: Self-pay | Admitting: Internal Medicine

## 2020-07-11 ENCOUNTER — Ambulatory Visit: Payer: BC Managed Care – PPO | Admitting: Internal Medicine

## 2020-07-11 VITALS — BP 122/78 | HR 61 | Ht 70.0 in | Wt 185.0 lb

## 2020-07-11 DIAGNOSIS — I10 Essential (primary) hypertension: Secondary | ICD-10-CM | POA: Diagnosis not present

## 2020-07-11 DIAGNOSIS — F411 Generalized anxiety disorder: Secondary | ICD-10-CM | POA: Diagnosis not present

## 2020-07-11 DIAGNOSIS — R1030 Lower abdominal pain, unspecified: Secondary | ICD-10-CM

## 2020-07-11 DIAGNOSIS — R1031 Right lower quadrant pain: Secondary | ICD-10-CM

## 2020-07-11 DIAGNOSIS — R739 Hyperglycemia, unspecified: Secondary | ICD-10-CM | POA: Diagnosis not present

## 2020-07-11 DIAGNOSIS — Z23 Encounter for immunization: Secondary | ICD-10-CM

## 2020-07-11 LAB — BASIC METABOLIC PANEL
BUN: 21 mg/dL (ref 6–23)
CO2: 29 mEq/L (ref 19–32)
Calcium: 9.4 mg/dL (ref 8.4–10.5)
Chloride: 104 mEq/L (ref 96–112)
Creatinine, Ser: 1.03 mg/dL (ref 0.40–1.50)
GFR: 77.43 mL/min (ref >60.00)
Glucose, Bld: 90 mg/dL (ref 70–99)
Potassium: 4.2 mEq/L (ref 3.5–5.1)
Sodium: 139 mEq/L (ref 135–145)

## 2020-07-11 NOTE — Patient Instructions (Signed)
You had the Tdap tetanus shot today  Please continue all other medications as before, and refills have been done if requested.  Please have the pharmacy call with any other refills you may need.  Please continue your efforts at being more active, low cholesterol diet, and weight control.  Please keep your appointments with your specialists as you may have planned  You will be contacted regarding the referral for: CT scan for mar 4 or mar 11  Please go to the LAB at the blood drawing area for the tests to be done today  You will be contacted by phone if any changes need to be made immediately.  Otherwise, you will receive a letter about your results with an explanation, but please check with MyChart first.  Please remember to sign up for MyChart if you have not done so, as this will be important to you in the future with finding out test results, communicating by private email, and scheduling acute appointments online when needed.

## 2020-07-11 NOTE — Progress Notes (Signed)
Patient ID: Richard Roach, male   DOB: November 05, 1955, 65 y.o.   MRN: [id number]        Chief Complaint: right inguinal pain x 10 days       HPI:  Richard Roach is a 65 y.o. male here with c/o onset right inguinal pain and swelling x 10 days, similar to pain he experienced in recent years, was seen per general surgury and informed he had an early [place] but did not appear to absolutely require surgical tx at that time, and pt deferred elective surgury then.  Now has worsening similar pain daily, mild to mod, associated with a new swelling in the inguinal region, but Denies worsening reflux, other abd pain, dysphagia, n/v, bowel change or blood.    Also Denies urinary symptoms such as dysuria, frequency, urgency, flank pain, hematuria or n/v, fever, chills. Pt has appt mar 25 with general surgury who advised for him to have CT abd/pelvis done prior so that tx could then proceed  Pt denies chest pain, increased sob or doe, wheezing, orthopnea, PND, increased LE swelling, palpitations, dizziness or syncope.  Pt denies polydipsia, polyuria,  Denies new focal neuro s/s. Denies worsening depressive symptoms, suicidal ideation, or panic  Also due for tdap today Wt Readings from Last 3 Encounters:  07/11/20 185 lb (83.9 kg)  12/06/19 179 lb 6.4 oz (81.4 kg)  10/06/19 184 lb 6.4 oz (83.6 kg)   BP Readings from Last 3 Encounters:  07/11/20 122/78  12/22/19 123/80  12/06/19 (!) 130/84         Past Medical History:  Diagnosis Date  . Allergic rhinitis   . Anxiety state 05/02/2015  . Chronic RLQ pain 05/02/2015  . Colon polyps   . Coronary artery disease   . Fatty liver   . GERD (gastroesophageal reflux disease)   . Hypercholesterolemia   . Hypertension   . Inguinal hernia   . Internal hemorrhoids   . Liver fibrosis 05/02/2015  . PVC's (premature ventricular contractions)   . Serrated adenoma of colon   . Sinus bradycardia    Past Surgical History:  Procedure Laterality Date  . KNEE ARTHROSCOPY Right June  2011  . KNEE ARTHROSCOPY Left 2012  . TONSILLECTOMY      reports that he quit smoking about 22 years ago. His smoking use included cigarettes. He has never used smokeless tobacco. He reports current alcohol use of about 14.0 standard drinks of alcohol per week. He reports that he does not use drugs. family history includes Asthma in his father; High blood pressure in his mother. No Known Allergies Current Outpatient Medications on File Prior to Visit  Medication Sig Dispense Refill  . aspirin EC 81 MG tablet Take 1 tablet (81 mg total) by mouth daily. 90 tablet 3  . Bioflavonoid Products (VITAMIN C) CHEW Chew by mouth.    . Cholecalciferol (D3-1000 PO) Take by mouth.    . metoprolol succinate (TOPROL-XL) 25 MG 24 hr tablet Take 1 tablet (25 mg total) by mouth daily. 90 tablet 3  . rosuvastatin (CRESTOR) 40 MG tablet TAKE 1 TABLET(40 MG) BY MOUTH DAILY 90 tablet 1  . sildenafil (VIAGRA) 100 MG tablet TAKE 1/2 TO 1 TABLET BY MOUTH EVERY DAY AS NEEDED FOR ERECTILE DYSFUNCTION 5 tablet 0   No current facility-administered medications on file prior to visit.        ROS:  All others reviewed and negative.  Objective        PE:  BP 122/78  Pulse 61   Ht 5\' 10"  (1.778 m)   Wt 185 lb (83.9 kg)   SpO2 97%   BMI 26.54 kg/m                 Constitutional: Pt appears in NAD               HENT: Head: NCAT.                Right Ear: External ear normal.                 Left Ear: External ear normal.                Eyes: . Pupils are equal, round, and reactive to light. Conjunctivae and EOM are normal               Nose: without d/c or deformity               Neck: Neck supple. Gross normal ROM               Cardiovascular: Normal rate and regular rhythm.                 Pulmonary/Chest: Effort normal and breath sounds without rales or wheezing.                Abd:  Soft, NT, ND, + BS, no organomegaly but has new mild to mod RIH swelling with mild tender, reducible                Neurological: Pt is alert. At baseline orientation, motor grossly intact               Skin: Skin is warm. No rashes, no other new lesions, LE edema - none               Psychiatric: Pt behavior is normal without agitation   Micro: none  Cardiac tracings I have personally interpreted today:  none  Pertinent Radiological findings (summarize): none   Lab Results  Component Value Date   WBC 5.6 09/19/2019   HGB 16.4 09/19/2019   HCT 48.6 09/19/2019   PLT 220.0 09/19/2019   GLUCOSE 90 07/11/2020   CHOL 136 11/27/2019   TRIG 63 11/27/2019   HDL 55 11/27/2019   LDLDIRECT 164.0 04/18/2012   LDLCALC 68 11/27/2019   ALT 22 09/19/2019   AST 20 09/19/2019   NA 139 07/11/2020   K 4.2 07/11/2020   CL 104 07/11/2020   CREATININE 1.03 07/11/2020   BUN 21 07/11/2020   CO2 29 07/11/2020   TSH 1.49 09/19/2019   PSA 2.35 09/19/2019   HGBA1C 5.7 09/19/2019   Assessment/Plan:  Richard Roach is a 65 y.o. White or Caucasian [1] male with  has a past medical history of Allergic rhinitis, Anxiety state (05/02/2015), Chronic RLQ pain (05/02/2015), Colon polyps, Coronary artery disease, Fatty liver, GERD (gastroesophageal reflux disease), Hypercholesterolemia, Hypertension, Inguinal hernia, Internal hemorrhoids, Liver fibrosis (05/02/2015), PVC's (premature ventricular contractions), Serrated adenoma of colon, and Sinus bradycardia.  Right groin pain With typical swelling c/w worsening now more symptomatic RIH - for bmp, then CT abd/pelvis w cm, then f/u general surgury mar 25 as planned  Essential hypertension BP Readings from Last 3 Encounters:  07/11/20 122/78  12/22/19 123/80  12/06/19 (!) 130/84   Stable, pt to continue medical treatment toprol   Current Outpatient Medications (Cardiovascular):  .  metoprolol succinate (TOPROL-XL) 25 MG 24 hr tablet, Take  1 tablet (25 mg total) by mouth daily. .  rosuvastatin (CRESTOR) 40 MG tablet, TAKE 1 TABLET(40 MG) BY MOUTH DAILY .  sildenafil (VIAGRA)  100 MG tablet, TAKE 1/2 TO 1 TABLET BY MOUTH EVERY DAY AS NEEDED FOR ERECTILE DYSFUNCTION   Current Outpatient Medications (Analgesics):  .  aspirin EC 81 MG tablet, Take 1 tablet (81 mg total) by mouth daily.   Current Outpatient Medications (Other):  Marland Kitchen  Bioflavonoid Products (VITAMIN C) CHEW, Chew by mouth. .  Cholecalciferol (D3-1000 PO), Take by mouth.   Hyperglycemia Lab Results  Component Value Date   HGBA1C 5.7 09/19/2019   Stable, pt to continue current medical treatment  - diet and wt control   Anxiety state Overall stable, mild, continue current - none   Followup: Return if symptoms worsen or fail to improve.  Cathlean Cower, MD 07/15/2020 9:09 AM [facility]

## 2020-07-15 ENCOUNTER — Encounter: Payer: Self-pay | Admitting: Internal Medicine

## 2020-07-15 NOTE — Assessment & Plan Note (Signed)
Overall stable, mild, continue current - none

## 2020-07-15 NOTE — Assessment & Plan Note (Signed)
With typical swelling c/w worsening now more symptomatic RIH - for bmp, then CT abd/pelvis w cm, then f/u general surgury mar 25 as planned

## 2020-07-15 NOTE — Assessment & Plan Note (Signed)
BP Readings from Last 3 Encounters:  07/11/20 122/78  12/22/19 123/80  12/06/19 (!) 130/84   Stable, pt to continue medical treatment toprol   Current Outpatient Medications (Cardiovascular):  .  metoprolol succinate (TOPROL-XL) 25 MG 24 hr tablet, Take 1 tablet (25 mg total) by mouth daily. .  rosuvastatin (CRESTOR) 40 MG tablet, TAKE 1 TABLET(40 MG) BY MOUTH DAILY .  sildenafil (VIAGRA) 100 MG tablet, TAKE 1/2 TO 1 TABLET BY MOUTH EVERY DAY AS NEEDED FOR ERECTILE DYSFUNCTION   Current Outpatient Medications (Analgesics):  .  aspirin EC 81 MG tablet, Take 1 tablet (81 mg total) by mouth daily.   Current Outpatient Medications (Other):  Marland Kitchen  Bioflavonoid Products (VITAMIN C) CHEW, Chew by mouth. .  Cholecalciferol (D3-1000 PO), Take by mouth.

## 2020-07-15 NOTE — Assessment & Plan Note (Signed)
Lab Results  Component Value Date   HGBA1C 5.7 09/19/2019   Stable, pt to continue current medical treatment  - diet and wt control

## 2020-07-25 ENCOUNTER — Other Ambulatory Visit: Payer: Self-pay

## 2020-07-25 ENCOUNTER — Ambulatory Visit
Admission: RE | Admit: 2020-07-25 | Discharge: 2020-07-25 | Disposition: A | Discharge status: Home / Self Care | Payer: BC Managed Care – PPO | Source: Ambulatory Visit | Attending: Internal Medicine | Admitting: Internal Medicine

## 2020-07-25 DIAGNOSIS — N400 Enlarged prostate without lower urinary tract symptoms: Secondary | ICD-10-CM | POA: Diagnosis not present

## 2020-07-25 DIAGNOSIS — K4090 Unilateral inguinal hernia, without obstruction or gangrene, not specified as recurrent: Secondary | ICD-10-CM | POA: Diagnosis not present

## 2020-07-25 DIAGNOSIS — K429 Umbilical hernia without obstruction or gangrene: Secondary | ICD-10-CM | POA: Diagnosis not present

## 2020-07-25 DIAGNOSIS — R1030 Lower abdominal pain, unspecified: Secondary | ICD-10-CM

## 2020-07-25 DIAGNOSIS — Q625 Duplication of ureter: Secondary | ICD-10-CM | POA: Diagnosis not present

## 2020-07-25 MED ORDER — IOPAMIDOL (ISOVUE-300) INJECTION 61%
100.0000 mL | Freq: Once | INTRAVENOUS | Status: AC | PRN
  Administered 2020-07-25: 100 mL via INTRAVENOUS

## 2020-07-27 ENCOUNTER — Encounter: Payer: Self-pay | Admitting: Internal Medicine

## 2020-08-02 DIAGNOSIS — R1031 Right lower quadrant pain: Secondary | ICD-10-CM | POA: Diagnosis not present

## 2020-09-13 ENCOUNTER — Other Ambulatory Visit (INDEPENDENT_AMBULATORY_CARE_PROVIDER_SITE_OTHER): Payer: BC Managed Care – PPO

## 2020-09-13 ENCOUNTER — Other Ambulatory Visit: Payer: Self-pay

## 2020-09-13 DIAGNOSIS — Z125 Encounter for screening for malignant neoplasm of prostate: Secondary | ICD-10-CM

## 2020-09-13 DIAGNOSIS — R739 Hyperglycemia, unspecified: Secondary | ICD-10-CM | POA: Diagnosis not present

## 2020-09-13 DIAGNOSIS — Z0000 Encounter for general adult medical examination without abnormal findings: Secondary | ICD-10-CM

## 2020-09-13 LAB — CBC WITH DIFFERENTIAL/PLATELET
Basophils Absolute: 0 10*3/uL (ref 0.0–0.1)
Basophils Relative: 0.5 % (ref 0.0–3.0)
Eosinophils Absolute: 0.2 10*3/uL (ref 0.0–0.7)
Eosinophils Relative: 3.4 % (ref 0.0–5.0)
HCT: 44.9 % (ref 39.0–52.0)
Hemoglobin: 15.3 g/dL (ref 13.0–17.0)
Lymphocytes Relative: 27.7 % (ref 12.0–46.0)
Lymphs Abs: 1.5 10*3/uL (ref 0.7–4.0)
MCHC: 34.1 g/dL (ref 30.0–36.0)
MCV: 90.3 fl (ref 78.0–100.0)
Monocytes Absolute: 0.4 10*3/uL (ref 0.1–1.0)
Monocytes Relative: 8.5 % (ref 3.0–12.0)
Neutro Abs: 3.1 10*3/uL (ref 1.4–7.7)
Neutrophils Relative %: 59.9 % (ref 43.0–77.0)
Platelets: 203 10*3/uL (ref 150.0–400.0)
RBC: 4.97 Mil/uL (ref 4.22–5.81)
RDW: 13.4 % (ref 11.5–15.5)
WBC: 5.2 10*3/uL (ref 4.0–10.5)

## 2020-09-13 LAB — BASIC METABOLIC PANEL
BUN: 20 mg/dL (ref 6–23)
CO2: 27 mEq/L (ref 19–32)
Calcium: 9.1 mg/dL (ref 8.4–10.5)
Chloride: 105 mEq/L (ref 96–112)
Creatinine, Ser: 1.11 mg/dL (ref 0.40–1.50)
GFR: 70.7 mL/min (ref >60.00)
Glucose, Bld: 92 mg/dL (ref 70–99)
Potassium: 4.3 mEq/L (ref 3.5–5.1)
Sodium: 140 mEq/L (ref 135–145)

## 2020-09-13 LAB — HEPATIC FUNCTION PANEL
ALT: 19 U/L (ref 0–53)
AST: 17 U/L (ref 0–37)
Albumin: 4.1 g/dL (ref 3.5–5.2)
Alkaline Phosphatase: 58 U/L (ref 39–117)
Bilirubin, Direct: 0.2 mg/dL (ref 0.0–0.3)
Total Bilirubin: 0.9 mg/dL (ref 0.2–1.2)
Total Protein: 6.7 g/dL (ref 6.0–8.3)

## 2020-09-13 LAB — LIPID PANEL
Cholesterol: 152 mg/dL (ref 0–200)
HDL: 58 mg/dL (ref >39.00)
LDL Cholesterol: 81 mg/dL (ref 0–99)
NonHDL: 94.09
Total CHOL/HDL Ratio: 3
Triglycerides: 66 mg/dL (ref 0.0–149.0)
VLDL: 13.2 mg/dL (ref 0.0–40.0)

## 2020-09-13 LAB — URINALYSIS, ROUTINE W REFLEX MICROSCOPIC
Bilirubin Urine: NEGATIVE
Hgb urine dipstick: NEGATIVE
Ketones, ur: NEGATIVE
Leukocytes,Ua: NEGATIVE
Nitrite: NEGATIVE
RBC / HPF: NONE SEEN (ref 0–?)
Specific Gravity, Urine: 1.01 (ref 1.000–1.030)
Total Protein, Urine: NEGATIVE
Urine Glucose: NEGATIVE
Urobilinogen, UA: 0.2 (ref 0.0–1.0)
WBC, UA: NONE SEEN (ref 0–?)
pH: 7.5 (ref 5.0–8.0)

## 2020-09-13 LAB — HEMOGLOBIN A1C: Hgb A1c MFr Bld: 5.8 % (ref 4.6–6.5)

## 2020-09-13 LAB — TSH: TSH: 2 u[IU]/mL (ref 0.35–4.50)

## 2020-09-13 LAB — PSA: PSA: 2.2 ng/mL (ref 0.10–4.00)

## 2020-09-19 ENCOUNTER — Ambulatory Visit (INDEPENDENT_AMBULATORY_CARE_PROVIDER_SITE_OTHER): Payer: BC Managed Care – PPO | Admitting: Internal Medicine

## 2020-09-19 ENCOUNTER — Other Ambulatory Visit: Payer: Self-pay

## 2020-09-19 ENCOUNTER — Encounter: Payer: Self-pay | Admitting: Internal Medicine

## 2020-09-19 VITALS — BP 126/70 | HR 59 | Temp 98.1°F | Ht 70.0 in | Wt 186.0 lb

## 2020-09-19 DIAGNOSIS — R739 Hyperglycemia, unspecified: Secondary | ICD-10-CM

## 2020-09-19 DIAGNOSIS — Z0001 Encounter for general adult medical examination with abnormal findings: Secondary | ICD-10-CM | POA: Diagnosis not present

## 2020-09-19 DIAGNOSIS — F411 Generalized anxiety disorder: Secondary | ICD-10-CM

## 2020-09-19 DIAGNOSIS — R61 Generalized hyperhidrosis: Secondary | ICD-10-CM

## 2020-09-19 DIAGNOSIS — E559 Vitamin D deficiency, unspecified: Secondary | ICD-10-CM

## 2020-09-19 DIAGNOSIS — I10 Essential (primary) hypertension: Secondary | ICD-10-CM

## 2020-09-19 DIAGNOSIS — E7800 Pure hypercholesterolemia, unspecified: Secondary | ICD-10-CM | POA: Diagnosis not present

## 2020-09-19 DIAGNOSIS — E538 Deficiency of other specified B group vitamins: Secondary | ICD-10-CM | POA: Diagnosis not present

## 2020-09-19 DIAGNOSIS — I2510 Atherosclerotic heart disease of native coronary artery without angina pectoris: Secondary | ICD-10-CM | POA: Insufficient documentation

## 2020-09-19 MED ORDER — DRYSOL 20 % EX SOLN: Freq: Every day | CUTANEOUS | 2 refills | Status: DC

## 2020-09-19 MED ORDER — EZETIMIBE 10 MG PO TABS: 10.0000 mg | ORAL_TABLET | Freq: Every day | ORAL | 3 refills | Status: DC

## 2020-09-19 NOTE — Progress Notes (Signed)
Patient ID: Richard Roach, male   DOB: [birth year], 65 y.o.   MRN: [id number]         Chief Complaint:: wellness exam and hld, hyperhidrosis       HPI:  Richard Roach is a 65 y.o. male here for wellness exam; up to date with preventive referrals and immunizations.                          Also mother died with rectal ca at 43yo and dementia recently, but grieving ok and denies worsening depressive symptoms, suicidal ideation, or panic; Does also have chronic issue of hyperhidrosis to palms and occasional other areas on a regular basis for as long as he can recall; has read about drysol and asking to try.  Trying to follow a lower cholesterol diet.  Pt denies chest pain, increased sob or doe, wheezing, orthopnea, PND, increased LE swelling, palpitations, dizziness or syncope.   Pt denies polydipsia, polyuria, or new focal neuro s/s.   Pt denies fever, wt loss, night sweats, loss of appetite, or other constitutional symptoms  No other new complaints.      Wt Readings from Last 3 Encounters:  09/19/20 186 lb (84.4 kg)  07/11/20 185 lb (83.9 kg)  12/06/19 179 lb 6.4 oz (81.4 kg)   BP Readings from Last 3 Encounters:  09/19/20 126/70  07/11/20 122/78  12/22/19 123/80   Immunization History  Administered Date(s) Administered  . DTP 04/10/1998  . Hep A / Hep B 11/01/2014, 04/10/2015  . Hepatitis B, adult 11/22/2014  . Influenza Split 03/11/2013  . Influenza Whole 05/11/2009, 03/11/2010, 01/10/2011, 03/11/2012  . Influenza,inj,Quad PF,6+ Mos 01/19/2014, 02/20/2018, 01/24/2019, 02/22/2020  . Influenza-Unspecified 02/09/2015  . PFIZER(Purple Top)SARS-COV-2 Vaccination 06/01/2019, 06/22/2019, 03/26/2020  . Pneumococcal Polysaccharide-23 05/12/2003  . Td 09/23/2007  . Tdap 05/06/2013, 07/11/2020  There are no preventive care reminders to display for this patient.    Past Medical History:  Diagnosis Date  . Allergic rhinitis   . Anxiety state 05/02/2015  . Chronic RLQ pain 05/02/2015  . Colon polyps    . Coronary artery disease   . Fatty liver   . GERD (gastroesophageal reflux disease)   . Hypercholesterolemia   . Hypertension   . Inguinal hernia   . Internal hemorrhoids   . Liver fibrosis 05/02/2015  . PVC's (premature ventricular contractions)   . Serrated adenoma of colon   . Sinus bradycardia    Past Surgical History:  Procedure Laterality Date  . KNEE ARTHROSCOPY Right June 2011  . KNEE ARTHROSCOPY Left 2012  . TONSILLECTOMY      reports that he quit smoking about 22 years ago. His smoking use included cigarettes. He has never used smokeless tobacco. He reports current alcohol use of about 14.0 standard drinks of alcohol per week. He reports that he does not use drugs. family history includes Asthma in his father; High blood pressure in his mother. No Known Allergies Current Outpatient Medications on File Prior to Visit  Medication Sig Dispense Refill  . aspirin EC 81 MG tablet Take 1 tablet (81 mg total) by mouth daily. 90 tablet 3  . Bioflavonoid Products (VITAMIN C) CHEW Chew by mouth.    . Cholecalciferol (D3-1000 PO) Take by mouth.    . metoprolol succinate (TOPROL-XL) 25 MG 24 hr tablet Take 1 tablet (25 mg total) by mouth daily. 90 tablet 3  . rosuvastatin (CRESTOR) 40 MG tablet TAKE 1 TABLET(40 MG) BY MOUTH DAILY  90 tablet 1  . sildenafil (VIAGRA) 100 MG tablet TAKE 1/2 TO 1 TABLET BY MOUTH EVERY DAY AS NEEDED FOR ERECTILE DYSFUNCTION 5 tablet 0   No current facility-administered medications on file prior to visit.        ROS:  All others reviewed and negative.  Objective        PE:  BP 126/70 (BP Location: Right Arm, Patient Position: Sitting, Cuff Size: Large)   Pulse (!) 59   Temp 98.1 F (36.7 C) (Oral)   Ht 5\' 10"  (1.778 m)   Wt 186 lb (84.4 kg)   SpO2 97%   BMI 26.69 kg/m                 Constitutional: Pt appears in NAD               HENT: Head: NCAT.                Right Ear: External ear normal.                 Left Ear: External ear normal.                 Eyes: . Pupils are equal, round, and reactive to light. Conjunctivae and EOM are normal               Nose: without d/c or deformity               Neck: Neck supple. Gross normal ROM               Cardiovascular: Normal rate and regular rhythm.                 Pulmonary/Chest: Effort normal and breath sounds without rales or wheezing.                Abd:  Soft, NT, ND, + BS, no organomegaly               Neurological: Pt is alert. At baseline orientation, motor grossly intact               Skin: Skin is warm. No rashes, no other new lesions, LE edema - none               Psychiatric: Pt behavior is normal without agitation , nervous with hyperhidrosis of palms  Micro: none  Cardiac tracings I have personally interpreted today:  none  Pertinent Radiological findings (summarize): none   Lab Results  Component Value Date   WBC 5.2 09/13/2020   HGB 15.3 09/13/2020   HCT 44.9 09/13/2020   PLT 203.0 09/13/2020   GLUCOSE 92 09/13/2020   CHOL 152 09/13/2020   TRIG 66.0 09/13/2020   HDL 58.00 09/13/2020   LDLDIRECT 164.0 04/18/2012   LDLCALC 81 09/13/2020   ALT 19 09/13/2020   AST 17 09/13/2020   NA 140 09/13/2020   K 4.3 09/13/2020   CL 105 09/13/2020   CREATININE 1.11 09/13/2020   BUN 20 09/13/2020   CO2 27 09/13/2020   TSH 2.00 09/13/2020   PSA 2.20 09/13/2020   HGBA1C 5.8 09/13/2020   Assessment/Plan:  Richard Roach is a 65 y.o. White or Caucasian [1] male with  has a past medical history of Allergic rhinitis, Anxiety state (05/02/2015), Chronic RLQ pain (05/02/2015), Colon polyps, Coronary artery disease, Fatty liver, GERD (gastroesophageal reflux disease), Hypercholesterolemia, Hypertension, Inguinal hernia, Internal hemorrhoids, Liver fibrosis (05/02/2015), PVC's (premature ventricular contractions), Serrated adenoma of  colon, and Sinus bradycardia.  Encounter for well adult exam with abnormal findings Age and sex appropriate education and counseling updated with  regular exercise and diet Referrals for preventative services - none needed Immunizations addressed - none needed Smoking counseling  - none needed Evidence for depression or other mood disorder - none significant Most recent labs reviewed. I have personally reviewed and have noted: 1) the patient's medical and social history 2) The patient's current medications and supplements 3) The patient's height, weight, and BMI have been recorded in the chart   HYPERHIDROSIS Chronic mild recurring for many years, ok for drysol prn asd,  to f/u any worsening symptoms or concerns  Hyperglycemia Lab Results  Component Value Date   HGBA1C 5.8 09/13/2020   Stable, pt to continue current medical treatment * - diet   Hypercholesterolemia Lab Results  Component Value Date   [place] 81 09/13/2020   Stable, pt to continue current statin but as goal ldl < 70 - ok to add zetia 10 qd as well, cont lower chol diet   Current Outpatient Medications (Cardiovascular):  .  ezetimibe (ZETIA) 10 MG tablet, Take 1 tablet (10 mg total) by mouth daily. .  metoprolol succinate (TOPROL-XL) 25 MG 24 hr tablet, Take 1 tablet (25 mg total) by mouth daily. .  rosuvastatin (CRESTOR) 40 MG tablet, TAKE 1 TABLET(40 MG) BY MOUTH DAILY .  sildenafil (VIAGRA) 100 MG tablet, TAKE 1/2 TO 1 TABLET BY MOUTH EVERY DAY AS NEEDED FOR ERECTILE DYSFUNCTION   Current Outpatient Medications (Analgesics):  .  aspirin EC 81 MG tablet, Take 1 tablet (81 mg total) by mouth daily.   Current Outpatient Medications (Other):  .  aluminum chloride (DRYSOL) 20 % external solution, Apply topically at bedtime. Marland Kitchen  Bioflavonoid Products (VITAMIN C) CHEW, Chew by mouth. .  Cholecalciferol (D3-1000 PO), Take by mouth.   Essential hypertension BP Readings from Last 3 Encounters:  09/19/20 126/70  07/11/20 122/78  12/22/19 123/80   Stable, pt to continue medical treatment toprol 25   Anxiety state Overall stable, declines med tx or  referral  Followup: Return in about 6 months (around 03/22/2021).  Cathlean Cower, MD 09/21/2020 6:41 PM [facility]

## 2020-09-19 NOTE — Patient Instructions (Signed)
Please take all new medication as prescribed - the zetia 10 mg for further lowering the cholesterol  Please take all new medication as prescribed - the drysol to assist with hyperhidrosis  Please continue all other medications as before, and refills have been done if requested.  Please have the pharmacy call with any other refills you may need.  Please continue your efforts at being more active, low cholesterol diet, and weight control.  You are otherwise up to date with prevention measures today.  Please keep your appointments with your specialists as you may have planned  Please make an Appointment to return in 6 months, or sooner if needed, also with Lab Appointment for testing done 3-5 days before at the [place] (so this is for TWO appointments - please see the scheduling desk as you leave)  Due to the ongoing Covid 19 pandemic, our lab now requires an appointment for any labs done at our office.  If you need labs done and do not have an appointment, please call our office ahead of time to schedule before presenting to the lab for your testing.

## 2020-09-21 ENCOUNTER — Encounter: Payer: Self-pay | Admitting: Internal Medicine

## 2020-09-21 NOTE — Assessment & Plan Note (Signed)
Lab Results  Component Value Date   LDLCALC 81 09/13/2020   Stable, pt to continue current statin but as goal ldl < 70 - ok to add zetia 10 qd as well, cont lower chol diet   Current Outpatient Medications (Cardiovascular):  .  ezetimibe (ZETIA) 10 MG tablet, Take 1 tablet (10 mg total) by mouth daily. .  metoprolol succinate (TOPROL-XL) 25 MG 24 hr tablet, Take 1 tablet (25 mg total) by mouth daily. .  rosuvastatin (CRESTOR) 40 MG tablet, TAKE 1 TABLET(40 MG) BY MOUTH DAILY .  sildenafil (VIAGRA) 100 MG tablet, TAKE 1/2 TO 1 TABLET BY MOUTH EVERY DAY AS NEEDED FOR ERECTILE DYSFUNCTION   Current Outpatient Medications (Analgesics):  .  aspirin EC 81 MG tablet, Take 1 tablet (81 mg total) by mouth daily.   Current Outpatient Medications (Other):  .  aluminum chloride (DRYSOL) 20 % external solution, Apply topically at bedtime. Marland Kitchen  Bioflavonoid Products (VITAMIN C) CHEW, Chew by mouth. .  Cholecalciferol (D3-1000 PO), Take by mouth.

## 2020-09-21 NOTE — Assessment & Plan Note (Signed)
BP Readings from Last 3 Encounters:  09/19/20 126/70  07/11/20 122/78  12/22/19 123/80   Stable, pt to continue medical treatment toprol 25

## 2020-09-21 NOTE — Assessment & Plan Note (Addendum)
Lab Results  Component Value Date   HGBA1C 5.8 09/13/2020   Stable, pt to continue current medical treatment  - diet  

## 2020-09-21 NOTE — Assessment & Plan Note (Signed)

## 2020-09-21 NOTE — Assessment & Plan Note (Signed)
Overall stable, declines med tx or referral

## 2020-09-21 NOTE — Assessment & Plan Note (Signed)
Chronic mild recurring for many years, ok for drysol prn asd,  to f/u any worsening symptoms or concerns

## 2020-09-24 ENCOUNTER — Telehealth: Payer: Self-pay

## 2020-09-24 NOTE — Telephone Encounter (Signed)
Received a note from patient in the mail- last office visit with PCP Dr.James Jenny Reichmann (note in epic) has patient starting Zetia 10 mg- he is questioning if it is okay for him to take and why he should take this- he was advised in regards to why he needs to take it but he wants to get Dr.O'Neals advice.   Will route to MD.

## 2020-09-24 NOTE — Telephone Encounter (Signed)
Please let him know that I agree with zetia. His LDL cholesterol has increased from last year. I think this is needed in addition to his statin. This will help any further calcium from developing.   [place] T. Audie Box, MD, [place]  [address], [place], [place] [address] [phone]  8:43 PM

## 2020-09-26 NOTE — Telephone Encounter (Signed)
Called patient, LVM advising of message from MD.  Left call back number if questions.

## 2020-11-12 ENCOUNTER — Telehealth: Payer: Self-pay | Admitting: Cardiovascular Disease

## 2020-11-12 ENCOUNTER — Other Ambulatory Visit: Payer: Self-pay | Admitting: Internal Medicine

## 2020-11-12 DIAGNOSIS — R351 Nocturia: Secondary | ICD-10-CM | POA: Diagnosis not present

## 2020-11-12 DIAGNOSIS — N401 Enlarged prostate with lower urinary tract symptoms: Secondary | ICD-10-CM | POA: Diagnosis not present

## 2020-11-12 MED ORDER — SILDENAFIL CITRATE 100 MG PO TABS: ORAL_TABLET | ORAL | 11 refills | Status: DC

## 2020-11-12 NOTE — Telephone Encounter (Signed)
Spoke with pt. He states he has been having some frequent heart palpitations. He states he notices them when he is working out or after a workout. Sometimes in the evening as well. He has been avoiding caffeine and alcohol. He does state he may be dehydrated with the weather. He wants to know if he should come in for a sooner appt. He has one scheduled for later in July.   He was encouraged to stay well hydrated and eat a well balanced diet. If his BP is not low and his heart palpitations are prolonged and bothersome, he could take an additional metoprolol if necessary. He was advised to speak with Dr. Audie Box regarding this if they continue.   He has verbalized understanding and had no additional questions.

## 2020-11-12 NOTE — Telephone Encounter (Signed)
Called patient, he states that he is able to come on Thursday- I can do the EKG on this day in the afternoon.  FYI. Thanks!

## 2020-11-12 NOTE — Telephone Encounter (Signed)
Patient c/o Palpitations:  High priority if patient c/o lightheadedness, shortness of breath, or chest pain  How long have you had palpitations/irregular HR/ Afib? Are you having the symptoms now? About 3 days or so, on and off   Are you currently experiencing lightheadedness, SOB or CP? Not really.  Do you have a history of afib (atrial fibrillation) or irregular heart rhythm? Not sure  Have you checked your BP or HR? (document readings if available):   127/84 HR 68   Are you experiencing any other symptoms? Patient said he gets a little lightheaded when he bends over or gets winded when he walks up and down the driveway which are not normal for him  Patient wanted to come in earlier to see if he could get an EKG or something done while he is having the symptoms

## 2020-11-12 NOTE — Telephone Encounter (Signed)
LVM for return call. 

## 2020-11-13 ENCOUNTER — Other Ambulatory Visit: Payer: Self-pay | Admitting: Internal Medicine

## 2020-11-13 NOTE — Telephone Encounter (Signed)
Please refill as per office routine med refill policy (all routine meds refilled for 3 mo or monthly per pt preference up to one year from last visit, then month to month grace period for 3 mo, then further med refills will have to be denied)  

## 2020-11-14 ENCOUNTER — Ambulatory Visit (INDEPENDENT_AMBULATORY_CARE_PROVIDER_SITE_OTHER): Payer: BC Managed Care – PPO

## 2020-11-14 ENCOUNTER — Other Ambulatory Visit: Payer: Self-pay

## 2020-11-14 ENCOUNTER — Telehealth: Payer: Self-pay | Admitting: Internal Medicine

## 2020-11-14 DIAGNOSIS — R002 Palpitations: Secondary | ICD-10-CM | POA: Diagnosis not present

## 2020-11-14 NOTE — Telephone Encounter (Signed)
1.Medication Requested: metoprolol succinate (TOPROL-XL) 25 MG 24 hr tablet  ezetimibe (ZETIA) 10 MG tablet  2. Pharmacy (Name, Street, [place]): [facility] [place] AT [address]  3. On Med List: yes   4. Last Visit with PCP: 09-19-20  5. Next visit date with PCP: n/a    Agent: Please be advised that RX refills may take up to 3 business days. We ask that you follow-up with your pharmacy.

## 2020-11-15 MED ORDER — METOPROLOL SUCCINATE ER 25 MG PO TB24: 25.0000 mg | ORAL_TABLET | Freq: Every day | ORAL | 3 refills | Status: DC

## 2020-11-15 NOTE — Telephone Encounter (Signed)
Refilled Metoprolol succinate. The prescription for ezetimibe ( Zetia) has 3 refills and was not refilled today.

## 2020-11-19 DIAGNOSIS — N401 Enlarged prostate with lower urinary tract symptoms: Secondary | ICD-10-CM | POA: Diagnosis not present

## 2020-11-19 DIAGNOSIS — R351 Nocturia: Secondary | ICD-10-CM | POA: Diagnosis not present

## 2020-11-20 ENCOUNTER — Telehealth: Payer: Self-pay | Admitting: Cardiovascular Disease

## 2020-11-20 ENCOUNTER — Ambulatory Visit (INDEPENDENT_AMBULATORY_CARE_PROVIDER_SITE_OTHER): Payer: BC Managed Care – PPO

## 2020-11-20 ENCOUNTER — Other Ambulatory Visit: Payer: Self-pay

## 2020-11-20 DIAGNOSIS — R002 Palpitations: Secondary | ICD-10-CM

## 2020-11-20 NOTE — Telephone Encounter (Signed)
Richard Roach is calling stating Dr. Audie Box advised him about 5 days ago that he was going to be ordering him a heart monitor and he was calling to check on the status of it. There is currently no order placed at this time. Please advise.

## 2020-11-20 NOTE — Progress Notes (Unsigned)
Patient enrolled for [employer] to mail a 3 day ZIO XT monitor to address on file.

## 2020-11-20 NOTE — Telephone Encounter (Signed)
3 day monitor ordered. Patient came in for EKG- per discussion with Dr.O'Neal we will order. Will notify patient this should be sent out to him.

## 2020-11-20 NOTE — Telephone Encounter (Signed)
Sent patient a message via my chart.  

## 2020-11-23 DIAGNOSIS — R002 Palpitations: Secondary | ICD-10-CM | POA: Diagnosis not present

## 2020-11-28 DIAGNOSIS — R002 Palpitations: Secondary | ICD-10-CM | POA: Diagnosis not present

## 2020-12-04 NOTE — Progress Notes (Signed)
Cardiology Office Note:   Date:  12/05/2020  NAME:  Richard Roach    MRN: [id number] DOB:  08/29/1955   PCP:  Biagio Borg, MD  Cardiologist:  Evalina Field, MD   Referring MD: Biagio Borg, MD   Chief Complaint  Patient presents with   Follow-up   History of Present Illness:   Richard Roach is a 65 y.o. male with a hx of elevated coronary calcium score, HLD who presents for follow-up. Was complaining of palpitations. Monitor was normal.  He reports he was going through a stressful negotiation time at work.  No further palpitations.  He reports he was having a fluttering in his chest.  Symptoms have resolved as work stress has resolved as well.  His lipid profile from his primary care physician shows LDL cholesterol has creeped up to 81.  He was placed on Zetia by his primary care physician which I do agree with.  He needs a fasting lipid profile in the next few weeks.  He is not fasting today.  We did discuss PCSK9 inhibitors.  He may be interested if his levels are not at goal.  We will see how he does doing on Zetia.  He denies any chest pain or trouble breathing.  He is exercising 20 to 30 minutes/day on the elliptical.  He is working on his diet.  Weight has increased roughly 3 pounds over the past few months.  He is going to work on his diet a bit more.  He overall seems to be doing well.  Just needs to continue with further secondary prevention to reduce his cholesterol level.   Problem List 1. CAD -CAC score 846 98th percentile 2. PVCs/palpitations (Holter 2016, 0.05% PVC burden) 3. ETT 10/24/2019: 12:05 min:s; 13.7 METS  4. Echo 65% 5. Fatty liver  6. Hyperlipidemia  -T chol 152, HDL 58, LDL 81, triglycerides 66 -A1c 5.8  Past Medical History: Past Medical History:  Diagnosis Date   Allergic rhinitis    Anxiety state 05/02/2015   Chronic RLQ pain 05/02/2015   Colon polyps    Coronary artery disease    Fatty liver    GERD (gastroesophageal reflux disease)     Hypercholesterolemia    Hypertension    Inguinal hernia    Internal hemorrhoids    Liver fibrosis 05/02/2015   PVC's (premature ventricular contractions)    Serrated adenoma of colon    Sinus bradycardia     Past Surgical History: Past Surgical History:  Procedure Laterality Date   KNEE ARTHROSCOPY Right June 2011   KNEE ARTHROSCOPY Left 2012   TONSILLECTOMY      Current Medications: Current Meds  Medication Sig   aluminum chloride (DRYSOL) 20 % external solution Apply topically at bedtime.   aspirin EC 81 MG tablet Take 1 tablet (81 mg total) by mouth daily.   Cholecalciferol (D3-1000 PO) Take by mouth.   ezetimibe (ZETIA) 10 MG tablet Take 1 tablet (10 mg total) by mouth daily.   metoprolol succinate (TOPROL-XL) 25 MG 24 hr tablet Take 1 tablet (25 mg total) by mouth daily.   rosuvastatin (CRESTOR) 40 MG tablet TAKE ONE TABLET BY MOUTH ONE TIME DAILY   sildenafil (VIAGRA) 100 MG tablet 1 tab by mouth one daily as needed     Allergies:    Patient has no known allergies.   Social History: Social History   Socioeconomic History   Marital status: Married    Spouse name: Not on file  Number of children: 2   Years of education: Not on file   Highest education level: Not on file  Occupational History   Occupation: Leisure centre manager  Tobacco Use   Smoking status: Former    Types: Cigarettes    Quit date: 05/11/1998    Years since quitting: 22.5   Smokeless tobacco: Never   Tobacco comments:    smoked only socially x 5 yrs  Substance and Sexual Activity   Alcohol use: Yes    Alcohol/week: 14.0 standard drinks    Types: 14 Glasses of wine per week   Drug use: No   Sexual activity: Yes  Other Topics Concern   Not on file  Social History Narrative   He lives with his wife, he is a Tree surgeon, travel, desk job, no smoking.   Social Determinants of Health   Financial Resource Strain: Not on file  Food Insecurity: Not on file  Transportation Needs: Not on  file  Physical Activity: Not on file  Stress: Not on file  Social Connections: Not on file     Family History: The patient's family history includes Asthma in his father; High blood pressure in his mother. There is no history of Colon cancer, Colon polyps, Diabetes, Kidney disease, Esophageal cancer, Liver cancer, Rectal cancer, or Stomach cancer.  ROS:   All other ROS reviewed and negative. Pertinent positives noted in the HPI.     EKGs/Labs/Other Studies Reviewed:   The following studies were personally reviewed by me today:   ETT 10/24/2019 There was no ST segment deviation noted during stress. Negative exercise treadmill test with no ischemia. The patient exercised for 12 minutes and 5 seconds reaching 86% PMHR. Excellent exercise capacity reaching 13.7 mets. This is a low risk study.  Zio 12/03/2020  Impression: 1. Normal monitor without significant arrhythmias. 2. Rare ectopy. Recent Labs: 09/13/2020: ALT 19; BUN 20; Creatinine, Ser 1.11; Hemoglobin 15.3; Platelets 203.0; Potassium 4.3; Sodium 140; TSH 2.00   Recent Lipid Panel    Component Value Date/Time   CHOL 152 09/13/2020 0749   CHOL 136 11/27/2019 0824   TRIG 66.0 09/13/2020 0749   HDL 58.00 09/13/2020 0749   HDL 55 11/27/2019 0824   CHOLHDL 3 09/13/2020 0749   VLDL 13.2 09/13/2020 0749   LDLCALC 81 09/13/2020 0749   LDLCALC 68 11/27/2019 0824   LDLDIRECT 164.0 04/18/2012 0930    Physical Exam:   VS:  BP 120/72 (BP Location: Left Arm, Patient Position: Sitting, Cuff Size: Normal)   Pulse 68   Ht '5\' 10"'$  (1.778 m)   Wt 188 lb (85.3 kg)   SpO2 97%   BMI 26.98 kg/m    Wt Readings from Last 3 Encounters:  12/05/20 188 lb (85.3 kg)  09/19/20 186 lb (84.4 kg)  07/11/20 185 lb (83.9 kg)    General: Well nourished, well developed, in no acute distress Head: Atraumatic, normal size  Eyes: PEERLA, EOMI  Neck: Supple, no JVD Endocrine: No thryomegaly Cardiac: Normal S1, S2; RRR; no murmurs, rubs, or  gallops Lungs: Clear to auscultation bilaterally, no wheezing, rhonchi or rales  Abd: Soft, nontender, no hepatomegaly  Ext: No edema, pulses 2+ Musculoskeletal: No deformities, BUE and BLE strength normal and equal Skin: Warm and dry, no rashes   Neuro: Alert and oriented to person, place, time, and situation, CNII-XII grossly intact, no focal deficits  Psych: Normal mood and affect   ASSESSMENT:   Richard Roach is a 65 y.o. male who presents for  the following: 1. Coronary artery disease involving native coronary artery of native heart without angina pectoris   2. Agatston coronary artery calcium score greater than 400   3. Mixed hyperlipidemia   4. Palpitations     PLAN:   1. Coronary artery disease involving native coronary artery of native heart without angina pectoris 2. Agatston coronary artery calcium score greater than 400 3. Mixed hyperlipidemia -Coronary calcium score a 846 which is 98 percentile.  Underwent an exercise treadmill stress test in June 2021 that showed no evidence of inducible ischemia.  He performed well on the treadmill exercising more than 12 minutes and achieving 14 METs without symptoms. -Blood pressure is well controlled.  He is exercising enough.  He is working on diet. -Most recent LDL cholesterol 81.  He was at goal last year.  He is on Crestor 40 mg daily.  Primary care physician appropriately started ezetimibe 10 mg daily.  His goal cholesterol should be less than 70.  I would like for him to come back in the next few weeks when he is fasting for a cholesterol profile.  If his LDL is not less than 70 I did discuss with him that he may need to be considered for PCSK9 inhibitor.  He is interested but would like to see where his numbers are.  We will communicate this to him by phone.  4. Palpitations -Had palpitations associated with stress at work.  Monitor was normal.  EKG when he came to the office was normal.  I suspect this was stress related.  Symptoms have  resolved.     Disposition: Return in about 1 year (around 12/05/2021).  Medication Adjustments/Labs and Tests Ordered: Current medicines are reviewed at length with the patient today.  Concerns regarding medicines are outlined above.  Orders Placed This Encounter  Procedures   Lipid panel    No orders of the defined types were placed in this encounter.   Patient Instructions  Medication Instructions:  The current medical regimen is effective;  continue present plan and medications.  *If you need a refill on your cardiac medications before your next appointment, please call your pharmacy*   Lab Work: LIPID (come back in the next few weeks, fasting, nothing to eat or drink)   If you have labs (blood work) drawn today and your tests are completely normal, you will receive your results only by: [place] (if you have MyChart) OR A paper copy in the mail If you have any lab test that is abnormal or we need to change your treatment, we will call you to review the results.   Follow-Up: At [facility], you and your health needs are our priority.  As part of our continuing mission to provide you with exceptional heart care, we have created designated Provider Care Teams.  These Care Teams include your primary Cardiologist (physician) and Advanced Practice Providers (APPs -  Physician Assistants and Nurse Practitioners) who all work together to provide you with the care you need, when you need it.  We recommend signing up for the patient portal called "MyChart".  Sign up information is provided on this After Visit Summary.  MyChart is used to connect with patients for Virtual Visits (Telemedicine).  Patients are able to view lab/test results, encounter notes, upcoming appointments, etc.  Non-urgent messages can be sent to your provider as well.   To learn more about what you can do with MyChart, go to [email or web address].    Your next appointment:  12 month(s)  The format  for your next appointment:   In Person  Provider:   You may see Evalina Field, MD or one of the following Advanced Practice Providers on your designated Care Team:   Almyra Deforest, PA-C [facility], [place]     Time Spent with Patient: I have spent a total of 35 minutes with patient reviewing hospital notes, telemetry, EKGs, labs and examining the patient as well as establishing an assessment and plan that was discussed with the patient.  > 50% of time was spent in direct patient care.  Signed, Addison Naegeli. Audie Box, MD, [place]  [address] [place], [place] [address] [phone]  12/05/2020 9:57 AM

## 2020-12-05 ENCOUNTER — Encounter: Payer: Self-pay | Admitting: Cardiovascular Disease

## 2020-12-05 ENCOUNTER — Other Ambulatory Visit: Payer: Self-pay

## 2020-12-05 ENCOUNTER — Ambulatory Visit: Payer: BC Managed Care – PPO | Admitting: Cardiovascular Disease

## 2020-12-05 VITALS — BP 120/72 | HR 68 | Ht 70.0 in | Wt 188.0 lb

## 2020-12-05 DIAGNOSIS — I2510 Atherosclerotic heart disease of native coronary artery without angina pectoris: Secondary | ICD-10-CM | POA: Diagnosis not present

## 2020-12-05 DIAGNOSIS — R002 Palpitations: Secondary | ICD-10-CM | POA: Diagnosis not present

## 2020-12-05 DIAGNOSIS — R931 Abnormal findings on diagnostic imaging of heart and coronary circulation: Secondary | ICD-10-CM | POA: Diagnosis not present

## 2020-12-05 DIAGNOSIS — E782 Mixed hyperlipidemia: Secondary | ICD-10-CM | POA: Diagnosis not present

## 2020-12-05 NOTE — Patient Instructions (Signed)
Medication Instructions:  The current medical regimen is effective;  continue present plan and medications.  *If you need a refill on your cardiac medications before your next appointment, please call your pharmacy*   Lab Work: LIPID (come back in the next few weeks, fasting, nothing to eat or drink)   If you have labs (blood work) drawn today and your tests are completely normal, you will receive your results only by: [place] (if you have MyChart) OR A paper copy in the mail If you have any lab test that is abnormal or we need to change your treatment, we will call you to review the results.   Follow-Up: At [facility], you and your health needs are our priority.  As part of our continuing mission to provide you with exceptional heart care, we have created designated Provider Care Teams.  These Care Teams include your primary Cardiologist (physician) and Advanced Practice Providers (APPs -  Physician Assistants and Nurse Practitioners) who all work together to provide you with the care you need, when you need it.  We recommend signing up for the patient portal called "MyChart".  Sign up information is provided on this After Visit Summary.  MyChart is used to connect with patients for Virtual Visits (Telemedicine).  Patients are able to view lab/test results, encounter notes, upcoming appointments, etc.  Non-urgent messages can be sent to your provider as well.   To learn more about what you can do with MyChart, go to [email or web address].    Your next appointment:   12 month(s)  The format for your next appointment:   In Person  Provider:   You may see Evalina Field, MD or one of the following Advanced Practice Providers on your designated Care Team:   Almyra Deforest, PA-C [facility], [place]

## 2020-12-20 DIAGNOSIS — E782 Mixed hyperlipidemia: Secondary | ICD-10-CM | POA: Diagnosis not present

## 2020-12-20 LAB — LIPID PANEL
Chol/HDL Ratio: 2.5 ratio (ref 0.0–5.0)
Cholesterol, Total: 141 mg/dL (ref 100–199)
HDL: 56 mg/dL (ref >39)
LDL Chol Calc (NIH): 72 mg/dL (ref 0–99)
Triglycerides: 61 mg/dL (ref 0–149)
VLDL Cholesterol Cal: 13 mg/dL (ref 5–40)

## 2020-12-25 ENCOUNTER — Other Ambulatory Visit: Payer: Self-pay

## 2020-12-25 ENCOUNTER — Ambulatory Visit (INDEPENDENT_AMBULATORY_CARE_PROVIDER_SITE_OTHER): Payer: Self-pay | Admitting: Plastic Surgery

## 2020-12-25 ENCOUNTER — Encounter: Payer: Self-pay | Admitting: Plastic Surgery

## 2020-12-25 VITALS — BP 153/81 | HR 63 | Ht 70.0 in | Wt 186.6 lb

## 2020-12-25 DIAGNOSIS — Z411 Encounter for cosmetic surgery: Secondary | ICD-10-CM

## 2020-12-25 NOTE — Progress Notes (Signed)
Patient presents to discuss Botox injection into his right hand.  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  He has had Botox injection to his palm in the past by Dr. Towanda Malkin and felt like he noticed some improvement.  We discussed the risks and benefits of Botox injection for this reason including transient hand weakness.  He is understanding and wants to move forward.  He did ask for a conservative amount to be injected for cost purposes.  The hand was prepped with an alcohol pad and a marker was used to plan out the distribution of injections.  30 units of Botox were distributed through approximately 20 injection sites.  He tolerated this well.  We will plan to see him at his next visit.

## 2020-12-28 DIAGNOSIS — R5383 Other fatigue: Secondary | ICD-10-CM | POA: Diagnosis not present

## 2020-12-28 DIAGNOSIS — Z20822 Contact with and (suspected) exposure to covid-19: Secondary | ICD-10-CM | POA: Diagnosis not present

## 2020-12-28 DIAGNOSIS — M791 Myalgia, unspecified site: Secondary | ICD-10-CM | POA: Diagnosis not present

## 2020-12-28 DIAGNOSIS — R42 Dizziness and giddiness: Secondary | ICD-10-CM | POA: Diagnosis not present

## 2021-02-19 DIAGNOSIS — R1011 Right upper quadrant pain: Secondary | ICD-10-CM | POA: Diagnosis not present

## 2021-02-19 DIAGNOSIS — F1090 Alcohol use, unspecified, uncomplicated: Secondary | ICD-10-CM | POA: Diagnosis not present

## 2021-02-19 DIAGNOSIS — K760 Fatty (change of) liver, not elsewhere classified: Secondary | ICD-10-CM | POA: Diagnosis not present

## 2021-03-03 DIAGNOSIS — L03011 Cellulitis of right finger: Secondary | ICD-10-CM | POA: Diagnosis not present

## 2021-03-06 DIAGNOSIS — R1011 Right upper quadrant pain: Secondary | ICD-10-CM | POA: Diagnosis not present

## 2021-03-06 DIAGNOSIS — K760 Fatty (change of) liver, not elsewhere classified: Secondary | ICD-10-CM | POA: Diagnosis not present

## 2021-03-19 DIAGNOSIS — J069 Acute upper respiratory infection, unspecified: Secondary | ICD-10-CM | POA: Diagnosis not present

## 2021-03-19 DIAGNOSIS — J209 Acute bronchitis, unspecified: Secondary | ICD-10-CM | POA: Diagnosis not present

## 2021-03-19 DIAGNOSIS — L03011 Cellulitis of right finger: Secondary | ICD-10-CM | POA: Diagnosis not present

## 2021-04-11 ENCOUNTER — Ambulatory Visit: Payer: BC Managed Care – PPO | Admitting: Internal Medicine

## 2021-04-18 ENCOUNTER — Encounter: Payer: Self-pay | Admitting: Internal Medicine

## 2021-04-18 ENCOUNTER — Other Ambulatory Visit: Payer: Self-pay

## 2021-04-18 ENCOUNTER — Ambulatory Visit (INDEPENDENT_AMBULATORY_CARE_PROVIDER_SITE_OTHER): Payer: BC Managed Care – PPO | Admitting: Internal Medicine

## 2021-04-18 ENCOUNTER — Ambulatory Visit (INDEPENDENT_AMBULATORY_CARE_PROVIDER_SITE_OTHER): Payer: BC Managed Care – PPO

## 2021-04-18 VITALS — BP 126/70 | HR 69 | Temp 98.9°F | Ht 70.0 in | Wt 189.0 lb

## 2021-04-18 DIAGNOSIS — J309 Allergic rhinitis, unspecified: Secondary | ICD-10-CM

## 2021-04-18 DIAGNOSIS — R059 Cough, unspecified: Secondary | ICD-10-CM | POA: Diagnosis not present

## 2021-04-18 DIAGNOSIS — R739 Hyperglycemia, unspecified: Secondary | ICD-10-CM

## 2021-04-18 DIAGNOSIS — I10 Essential (primary) hypertension: Secondary | ICD-10-CM

## 2021-04-18 DIAGNOSIS — R052 Subacute cough: Secondary | ICD-10-CM

## 2021-04-18 NOTE — Progress Notes (Signed)
Patient ID: Richard Roach, male   DOB: July 19, 1955, 65 y.o.   MRN: [id number]        Chief Complaint: follow persistent cough x 7 wks       HPI:  Matt Wichman is a 65 y.o. male here with c/o persistent intermittent non prod cough x 7 wks assoc with URI symptoms it seems a few weeks out of that, and incidentally much improved in the last 2 days, without fever, chill, and Pt denies chest pain, increased sob or doe, wheezing, orthopnea, PND, increased LE swelling, palpitations, dizziness or syncope.   Pt denies polydipsia, polyuria, or new focal neuro s/s.   Pt denies wt loss, night sweats, loss of appetite, or other constitutional symptoms  No other new complaints  Does hae mild intermittent nasal allergy congestion as well.   Wt Readings from Last 3 Encounters:  04/18/21 189 lb (85.7 kg)  12/25/20 186 lb 9.6 oz (84.6 kg)  12/05/20 188 lb (85.3 kg)   BP Readings from Last 3 Encounters:  04/18/21 126/70  12/25/20 (!) 153/81  12/05/20 120/72         Past Medical History:  Diagnosis Date   Allergic rhinitis    Anxiety state 05/02/2015   Chronic RLQ pain 05/02/2015   Colon polyps    Coronary artery disease    Fatty liver    GERD (gastroesophageal reflux disease)    Hypercholesterolemia    Hypertension    Inguinal hernia    Internal hemorrhoids    Liver fibrosis 05/02/2015   PVC's (premature ventricular contractions)    Serrated adenoma of colon    Sinus bradycardia    Past Surgical History:  Procedure Laterality Date   KNEE ARTHROSCOPY Right June 2011   KNEE ARTHROSCOPY Left 2012   TONSILLECTOMY      reports that he quit smoking about 22 years ago. His smoking use included cigarettes. He has never used smokeless tobacco. He reports current alcohol use of about 14.0 standard drinks per week. He reports that he does not use drugs. family history includes Asthma in his father; High blood pressure in his mother. No Known Allergies Current Outpatient Medications on File Prior to Visit   Medication Sig Dispense Refill   aluminum chloride (DRYSOL) 20 % external solution Apply topically at bedtime. 35 mL 2   aspirin EC 81 MG tablet Take 1 tablet (81 mg total) by mouth daily. 90 tablet 3   ezetimibe (ZETIA) 10 MG tablet Take 1 tablet (10 mg total) by mouth daily. 90 tablet 3   metoprolol succinate (TOPROL-XL) 25 MG 24 hr tablet Take 1 tablet (25 mg total) by mouth daily. 90 tablet 3   rosuvastatin (CRESTOR) 40 MG tablet TAKE ONE TABLET BY MOUTH ONE TIME DAILY 90 tablet 1   sildenafil (VIAGRA) 100 MG tablet 1 tab by mouth one daily as needed 5 tablet 11   No current facility-administered medications on file prior to visit.        ROS:  All others reviewed and negative.  Objective        PE:  BP 126/70 (BP Location: Right Arm, Patient Position: Sitting, Cuff Size: Large)   Pulse 69   Temp 98.9 F (37.2 C) (Oral)   Ht 5\' 10"  (1.778 m)   Wt 189 lb (85.7 kg)   SpO2 98%   BMI 27.12 kg/m                 Constitutional: Pt appears in NAD  HENT: Head: NCAT.                Right Ear: External ear normal.                 Left Ear: External ear normal.                Eyes: . Pupils are equal, round, and reactive to light. Conjunctivae and EOM are normal               Nose: without d/c or deformity               Neck: Neck supple. Gross normal ROM               Cardiovascular: Normal rate and regular rhythm.                 Pulmonary/Chest: Effort normal and breath sounds without rales or wheezing.                Abd:  Soft, NT, ND, + BS, no organomegaly               Neurological: Pt is alert. At baseline orientation, motor grossly intact               Skin: Skin is warm. No rashes, no other new lesions, LE edema - none               Psychiatric: Pt behavior is normal without agitation   Micro: none  Cardiac tracings I have personally interpreted today:  none  Pertinent Radiological findings (summarize): none   Lab Results  Component Value Date   WBC 5.2  09/13/2020   HGB 15.3 09/13/2020   HCT 44.9 09/13/2020   PLT 203.0 09/13/2020   GLUCOSE 92 09/13/2020   CHOL 141 12/20/2020   TRIG 61 12/20/2020   HDL 56 12/20/2020   LDLDIRECT 164.0 04/18/2012   LDLCALC 72 12/20/2020   ALT 19 09/13/2020   AST 17 09/13/2020   NA 140 09/13/2020   K 4.3 09/13/2020   CL 105 09/13/2020   CREATININE 1.11 09/13/2020   BUN 20 09/13/2020   CO2 27 09/13/2020   TSH 2.00 09/13/2020   PSA 2.20 09/13/2020   HGBA1C 5.8 09/13/2020   Assessment/Plan:  Richard Roach is a 65 y.o. White or Caucasian [1] male with  has a past medical history of Allergic rhinitis, Anxiety state (05/02/2015), Chronic RLQ pain (05/02/2015), Colon polyps, Coronary artery disease, Fatty liver, GERD (gastroesophageal reflux disease), Hypercholesterolemia, Hypertension, Inguinal hernia, Internal hemorrhoids, Liver fibrosis (05/02/2015), PVC's (premature ventricular contractions), Serrated adenoma of colon, and Sinus bradycardia.  Essential hypertension BP Readings from Last 3 Encounters:  04/18/21 126/70  12/25/20 (!) 153/81  12/05/20 120/72   Stable, pt to continue medical treatment toprol   Allergic rhinitis Mild, for otc prn allegra and nasacort prn,  to f/u any worsening symptoms or concerns  Hyperglycemia Lab Results  Component Value Date   HGBA1C 5.8 09/13/2020   Stable, pt to continue current medical treatment  - diet   Cough Etiology unclear, possible post infecitous or allergy even asthma related, for cxr,  to f/u any worsening symptoms or concerns  Followup: Return if symptoms worsen or fail to improve.  Cathlean Cower, MD 04/19/2021 9:23 PM [facility]

## 2021-04-19 ENCOUNTER — Encounter: Payer: Self-pay | Admitting: Internal Medicine

## 2021-04-19 NOTE — Assessment & Plan Note (Signed)
Mild, for otc prn allegra and nasacort prn,  to f/u any worsening symptoms or concerns

## 2021-04-19 NOTE — Patient Instructions (Signed)
Please continue all other medications as before, and refills have been done if requested.  Please have the pharmacy call with any other refills you may need.  Please continue your efforts at being more active, low cholesterol diet, and weight control.  Please keep your appointments with your specialists as you may have planned  Please go to the XRAY Department in the first floor for the x-ray testing  You will be contacted by phone if any changes need to be made immediately.  Otherwise, you will receive a letter about your results with an explanation, but please check with MyChart first.

## 2021-04-19 NOTE — Assessment & Plan Note (Signed)
BP Readings from Last 3 Encounters:  04/18/21 126/70  12/25/20 (!) 153/81  12/05/20 120/72   Stable, pt to continue medical treatment toprol

## 2021-04-19 NOTE — Assessment & Plan Note (Signed)
Lab Results  Component Value Date   HGBA1C 5.8 09/13/2020   Stable, pt to continue current medical treatment  - diet

## 2021-04-19 NOTE — Assessment & Plan Note (Signed)
Etiology unclear, possible post infecitous or allergy even asthma related, for cxr,  to f/u any worsening symptoms or concerns

## 2021-05-18 ENCOUNTER — Other Ambulatory Visit: Payer: Self-pay | Admitting: Internal Medicine

## 2021-05-18 NOTE — Telephone Encounter (Signed)
Please refill as per office routine med refill policy (all routine meds to be refilled for 3 mo or monthly (per pt preference) up to one year from last visit, then month to month grace period for 3 mo, then further med refills will have to be denied) ? ?

## 2021-05-19 DIAGNOSIS — D225 Melanocytic nevi of trunk: Secondary | ICD-10-CM | POA: Diagnosis not present

## 2021-05-19 DIAGNOSIS — Z1283 Encounter for screening for malignant neoplasm of skin: Secondary | ICD-10-CM | POA: Diagnosis not present

## 2021-05-23 ENCOUNTER — Telehealth: Payer: Self-pay | Admitting: Internal Medicine

## 2021-05-23 ENCOUNTER — Telehealth (INDEPENDENT_AMBULATORY_CARE_PROVIDER_SITE_OTHER): Payer: BC Managed Care – PPO | Admitting: Internal Medicine

## 2021-05-23 DIAGNOSIS — F411 Generalized anxiety disorder: Secondary | ICD-10-CM

## 2021-05-23 DIAGNOSIS — R739 Hyperglycemia, unspecified: Secondary | ICD-10-CM

## 2021-05-23 DIAGNOSIS — I10 Essential (primary) hypertension: Secondary | ICD-10-CM

## 2021-05-23 MED ORDER — ALPRAZOLAM 0.5 MG PO TABS: ORAL_TABLET | ORAL | 0 refills | Status: DC

## 2021-05-23 NOTE — Progress Notes (Signed)
Patient ID: Richard Roach, male   DOB: 08-23-1955, 66 y.o.   MRN: [id number]  Virtual Visit via Video Note  I connected with Richard Roach on 05/23/21 at  3:40 PM EST by a video enabled telemedicine application and verified that I am speaking with the correct person using two identifiers.  Location of all participants today Patient: at home Provider: at office   I discussed the limitations of evaluation and management by telemedicine and the availability of in person appointments. The patient expressed understanding and agreed to proceed.  History of Present Illness: Here with c/o worsening now almost debilitating social anxiety; has to make presentations to in person gathering up to 1000 persons for work purpose, has next one soon, and has increased marked anxiety that causes less presentation performance; Pt denies chest pain, increased sob or doe, wheezing, orthopnea, PND, increased LE swelling, palpitations, dizziness or syncope.   Pt denies polydipsia, polyuria, or new focal neuro s/s.   Pt denies fever, wt loss, night sweats, loss of appetite, or other constitutional symptoms  Denies worsening depressive symptoms, suicidal ideation, or panic.   Past Medical History:  Diagnosis Date   Allergic rhinitis    Anxiety state 05/02/2015   Chronic RLQ pain 05/02/2015   Colon polyps    Coronary artery disease    Fatty liver    GERD (gastroesophageal reflux disease)    Hypercholesterolemia    Hypertension    Inguinal hernia    Internal hemorrhoids    Liver fibrosis 05/02/2015   PVC's (premature ventricular contractions)    Serrated adenoma of colon    Sinus bradycardia    Past Surgical History:  Procedure Laterality Date   KNEE ARTHROSCOPY Right June 2011   KNEE ARTHROSCOPY Left 2012   TONSILLECTOMY      reports that he quit smoking about 23 years ago. His smoking use included cigarettes. He has never used smokeless tobacco. He reports current alcohol use of about 14.0 standard drinks per week.  He reports that he does not use drugs. family history includes Asthma in his father; High blood pressure in his mother. No Known Allergies Current Outpatient Medications on File Prior to Visit  Medication Sig Dispense Refill   aluminum chloride (DRYSOL) 20 % external solution Apply topically at bedtime. 35 mL 2   aspirin EC 81 MG tablet Take 1 tablet (81 mg total) by mouth daily. 90 tablet 3   ezetimibe (ZETIA) 10 MG tablet Take 1 tablet (10 mg total) by mouth daily. 90 tablet 3   metoprolol succinate (TOPROL-XL) 25 MG 24 hr tablet Take 1 tablet (25 mg total) by mouth daily. 90 tablet 3   rosuvastatin (CRESTOR) 40 MG tablet TAKE ONE TABLET BY MOUTH ONE TIME DAILY 90 tablet 1   sildenafil (VIAGRA) 100 MG tablet 1 tab by mouth one daily as needed 5 tablet 11   No current facility-administered medications on file prior to visit.    Observations/Objective: Alert, NAD, appropriate mood and affect, resps normal, cn 2-12 intact, moves all 4s, no visible rash or swelling Lab Results  Component Value Date   WBC 5.2 09/13/2020   HGB 15.3 09/13/2020   HCT 44.9 09/13/2020   PLT 203.0 09/13/2020   GLUCOSE 92 09/13/2020   CHOL 141 12/20/2020   TRIG 61 12/20/2020   HDL 56 12/20/2020   LDLDIRECT 164.0 04/18/2012   LDLCALC 72 12/20/2020   ALT 19 09/13/2020   AST 17 09/13/2020   NA 140 09/13/2020   K 4.3 09/13/2020  CL 105 09/13/2020   CREATININE 1.11 09/13/2020   BUN 20 09/13/2020   CO2 27 09/13/2020   TSH 2.00 09/13/2020   PSA 2.20 09/13/2020   HGBA1C 5.8 09/13/2020   Assessment and Plan: See notes  Follow Up Instructions: See notes   I discussed the assessment and treatment plan with the patient. The patient was provided an opportunity to ask questions and all were answered. The patient agreed with the plan and demonstrated an understanding of the instructions.   The patient was advised to call back or seek an in-person evaluation if the symptoms worsen or if the condition fails to  improve as anticipated.  Cathlean Cower, MD

## 2021-05-23 NOTE — Telephone Encounter (Signed)
No note needed 

## 2021-05-24 ENCOUNTER — Encounter: Payer: Self-pay | Admitting: Internal Medicine

## 2021-05-24 NOTE — Assessment & Plan Note (Signed)
With worsening social anxiety - for xanax prn asd,  to f/u any worsening symptoms or concerns

## 2021-05-24 NOTE — Patient Instructions (Signed)
Please take all new medication as prescribed 

## 2021-05-24 NOTE — Assessment & Plan Note (Signed)
Pt to continue to follow closely at home, goal < 140/90 or better 130/80

## 2021-05-24 NOTE — Assessment & Plan Note (Signed)
Lab Results  Component Value Date   HGBA1C 5.8 09/13/2020   Stable, pt to continue current medical treatment  - diet

## 2021-08-11 ENCOUNTER — Institutional Professional Consult (permissible substitution): Payer: BC Managed Care – PPO | Admitting: Internal Medicine

## 2021-08-15 ENCOUNTER — Other Ambulatory Visit: Payer: Self-pay | Admitting: Internal Medicine

## 2021-08-15 NOTE — Telephone Encounter (Signed)
Please refill as per office routine med refill policy (all routine meds to be refilled for 3 mo or monthly (per pt preference) up to one year from last visit, then month to month grace period for 3 mo, then further med refills will have to be denied) ? ?

## 2021-09-04 NOTE — Progress Notes (Signed)
? ? ?Office Visit  ?  ?Patient Name: Richard Roach ?Date of Encounter: 09/05/2021 ? ?Primary Care Provider:  Biagio Borg, MD ?Primary Cardiologist:  Evalina Field, MD ? ?Chief Complaint  ?  ?66 year old male with a history of CAD, hypertension, hyperlipidemia, palpitations, PACs/PVCs, GERD and anxiety who presents for follow-up related to CAD.  ? ?Past Medical History  ?  ?Past Medical History:  ?Diagnosis Date  ? Allergic rhinitis   ? Anxiety state 05/02/2015  ? Chronic RLQ pain 05/02/2015  ? Colon polyps   ? Coronary artery disease   ? Fatty liver   ? GERD (gastroesophageal reflux disease)   ? Hypercholesterolemia   ? Hypertension   ? Inguinal hernia   ? Internal hemorrhoids   ? Liver fibrosis 05/02/2015  ? PVC's (premature ventricular contractions)   ? Serrated adenoma of colon   ? Sinus bradycardia   ? ?Past Surgical History:  ?Procedure Laterality Date  ? KNEE ARTHROSCOPY Right June 2011  ? KNEE ARTHROSCOPY Left 2012  ? TONSILLECTOMY    ? ? ?Allergies ? ?No Known Allergies ? ?History of Present Illness  ?  ?66 year old male with the above past medical history including CAD, hypertension, hyperlipidemia, palpitations, PACs/PVCs, GERD and anxiety. ? ?Echocardiogram in 2014 showed EF 65 to 70%, G2 DD, trivial mitral valve regurgitation.  ETT in 2018 showed no evidence of ischemia.  Coronary calcium score in May 2021 was 67, 90th percentile for age and sex matched controls.  Follow-up ETT in June 2021 is low risk. Cardiac monitor in the setting of increased palpitations in June 2022 showed rare PACs/PVCs, no significant arrhythmias. Palpitations were primarily associated with stress. He was last seen in the office on 12/05/2020 and was stable from a cardiac standpoint. He denied symptoms concerning for angina. His cholesterol was slightly above goal, and his PCP started him on Zetia.  ? ?He presents today for follow-up.  Since his last visit and over the past 4 months he has noticed increased shortness of breath  with activity, generalized fatigue. He gets short of breath walking up his driveway and after walking 2 holes of golf. He has been napping more frequently. He denies chest pain. Other than his shortness of breath and fatigue, he denies any additional concerns today. ? ?Home Medications  ?  ?Current Outpatient Medications  ?Medication Sig Dispense Refill  ? Ascorbic Acid (VITAMIN C) 1000 MG tablet Take 1,000 mg by mouth daily.    ? aspirin EC 81 MG tablet Take 1 tablet (81 mg total) by mouth daily. 90 tablet 3  ? ezetimibe (ZETIA) 10 MG tablet TAKE ONE TABLET BY MOUTH ONE TIME DAILY 90 tablet 0  ? metoprolol succinate (TOPROL-XL) 25 MG 24 hr tablet Take 1 tablet (25 mg total) by mouth daily. 90 tablet 3  ? Multiple Vitamin (MULTIVITAMIN) capsule Take 1 capsule by mouth daily.    ? rosuvastatin (CRESTOR) 40 MG tablet TAKE ONE TABLET BY MOUTH ONE TIME DAILY 90 tablet 1  ? sildenafil (VIAGRA) 100 MG tablet 1 tab by mouth one daily as needed 5 tablet 11  ? ?No current facility-administered medications for this visit.  ?  ? ?Review of Systems  ?  ?He denies chest pain, palpitations, pnd, orthopnea, n, v, dizziness, syncope, edema, weight gain, or early satiety. All other systems reviewed and are otherwise negative except as noted above.  ? ?Physical Exam  ?  ?VS:  BP 122/82 (BP Location: Left Arm, Patient Position: Sitting, Cuff Size: Normal)  Resp 20   Ht '5\' 10"'$  (1.778 m)   Wt 188 lb (85.3 kg)   SpO2 98%   BMI 26.98 kg/m?   ?GEN: Well nourished, well developed, in no acute distress. ?HEENT: normal. ?Neck: Supple, no JVD, carotid bruits, or masses. ?Cardiac: RRR, no murmurs, rubs, or gallops. No clubbing, cyanosis, edema.  Radials/DP/PT 2+ and equal bilaterally.  ?Respiratory:  Respirations regular and unlabored, clear to auscultation bilaterally. ?GI: Soft, nontender, nondistended, BS + x 4. ?MS: no deformity or atrophy. ?Skin: warm and dry, no rash. ?Neuro:  Strength and sensation are intact. ?Psych: Normal  affect. ? ?Accessory Clinical Findings  ?  ?ECG personally reviewed by me today - Sinus bradycardia, 58 bpm - no acute changes. ? ?Lab Results  ?Component Value Date  ? WBC 5.2 09/13/2020  ? HGB 15.3 09/13/2020  ? HCT 44.9 09/13/2020  ? MCV 90.3 09/13/2020  ? PLT 203.0 09/13/2020  ? ?Lab Results  ?Component Value Date  ? CREATININE 1.11 09/13/2020  ? BUN 20 09/13/2020  ? NA 140 09/13/2020  ? K 4.3 09/13/2020  ? CL 105 09/13/2020  ? CO2 27 09/13/2020  ? ?Lab Results  ?Component Value Date  ? ALT 19 09/13/2020  ? AST 17 09/13/2020  ? ALKPHOS 58 09/13/2020  ? BILITOT 0.9 09/13/2020  ? ?Lab Results  ?Component Value Date  ? CHOL 141 12/20/2020  ? HDL 56 12/20/2020  ? Pyote 72 12/20/2020  ? LDLDIRECT 164.0 04/18/2012  ? TRIG 61 12/20/2020  ? CHOLHDL 2.5 12/20/2020  ?  ?Lab Results  ?Component Value Date  ? HGBA1C 5.8 09/13/2020  ? ? ?Assessment & Plan  ?  ?1. CAD/shortness of breath: Echocardiogram in 2014 showed EF 65 to 70%, G2 DD, trivial mitral valve regurgitation. Coronary calcium score in May 2021 was 50, 90th percentile for age and sex matched controls. Follow-up ETT in June 2021 is low risk. Over the past 4 months he has noted increased shortness of breath with activity, generalized fatigue. He denies chest pain, edema, PND, orthopnea, weight gain. Other than daytime fatigue, he denies symptoms concerning for sleep apnea. Discussed options for further ischemic evaluation. Given new dyspnea on exertion, will repeat echocardiogram, given the elevated coronary calcium score, will pursue cardiac PET stress test.  Patient agrees to proceed. ? ?Shared Decision Making/Informed Consent ?The risks [chest pain, shortness of breath, cardiac arrhythmias, dizziness, blood pressure fluctuations, myocardial infarction, stroke/transient ischemic attack, nausea, vomiting, allergic reaction, radiation exposure, metallic taste sensation and life-threatening complications (estimated to be 1 in 10,000)], benefits (risk  stratification, diagnosing coronary artery disease, treatment guidance) and alternatives of a cardiac PET stress test were discussed in detail with Mr. Brodzinski and he agrees to proceed.  ? ?2. Hypertension: BP well controlled. Continue current antihypertensive regimen.  ? ?3. Hyperlipidemia: LDL was 72 in 12/2020.  Continue Crestor, Zetia. ? ?4. Palpitations: Cardiac monitor in the setting of increased palpitations in June 2022 showed rare PACs/PVCs, no significant arrhythmias.  He denies any recent palpitations.  Continue metoprolol. ? ?5. Disposition: Follow-up in 6- 8 weeks. ? ?Lenna Sciara, NP ?09/05/2021, 10:24 AM ?  ?

## 2021-09-05 ENCOUNTER — Ambulatory Visit: Payer: BC Managed Care – PPO | Admitting: Nurse Practitioner

## 2021-09-05 ENCOUNTER — Encounter: Payer: Self-pay | Admitting: Nurse Practitioner

## 2021-09-05 VITALS — BP 122/82 | Resp 20 | Ht 70.0 in | Wt 188.0 lb

## 2021-09-05 DIAGNOSIS — E785 Hyperlipidemia, unspecified: Secondary | ICD-10-CM | POA: Diagnosis not present

## 2021-09-05 DIAGNOSIS — R002 Palpitations: Secondary | ICD-10-CM

## 2021-09-05 DIAGNOSIS — I10 Essential (primary) hypertension: Secondary | ICD-10-CM

## 2021-09-05 DIAGNOSIS — R0602 Shortness of breath: Secondary | ICD-10-CM | POA: Diagnosis not present

## 2021-09-05 DIAGNOSIS — I2510 Atherosclerotic heart disease of native coronary artery without angina pectoris: Secondary | ICD-10-CM

## 2021-09-05 NOTE — Patient Instructions (Addendum)
Medication Instructions:  ?Your physician recommends that you continue on your current medications as directed. Please refer to the Current Medication list given to you today. ? ?*If you need a refill on your cardiac medications before your next appointment, please call your pharmacy* ? ? ?Lab Work: ?NONE ordered at this time of appointment  ? ?If you have labs (blood work) drawn today and your tests are completely normal, you will receive your results only by: ?MyChart Message (if you have MyChart) OR ?A paper copy in the mail ?If you have any lab test that is abnormal or we need to change your treatment, we will call you to review the results. ? ? ?Testing/Procedures: ?Your physician has requested that you have an echocardiogram. Echocardiography is a painless test that uses sound waves to create images of your heart. It provides your doctor with information about the size and shape of your heart and how well your heart?s chambers and valves are working. This procedure takes approximately one hour. There are no restrictions for this procedure.  ?How to Prepare for Your Cardiac PET/CT Stress Test: ? ?1. Please do not take these medications before your test:  ? ?Medications that may interfere with the cardiac pharmacological stress agent (ex. nitrates or beta-blockers) the day of the exam. ?Theophylline containing medications for 12 hours. ?Dipyridamole 48 hours prior to the test. ?Your remaining medications may be taken with water. ? ?2. Nothing to eat or drink, except water, 3 hours prior to arrival time.   ?NO caffeine/decaffeinated products, or chocolate 12 hours prior to arrival. ? ?3. NO perfume, cologne or lotion ? ?4. Total time is 1 to 2 hours; you may want to bring reading material for the waiting time. ? ?5. Please report to Admitting at the [facility] 60 minutes early for your test. ? [facility] ? [place], [place] [address] ? ?Diabetic Preparation:  ?Hold oral  medications. ?You may take NPH and Lantus insulin. ?Do not take Humalog or Humulin R (Regular Insulin) the day of your test. ?Check blood sugars prior to leaving the house. ?If able to eat breakfast prior to 3 hour fasting, you may take all medications, including your insulin, ?Do not worry if you miss your breakfast dose of insulin - start at your next meal. ? ?IF YOU THINK YOU MAY BE PREGNANT, OR ARE NURSING PLEASE INFORM THE TECHNOLOGIST. ? ?In preparation for your appointment, medication and supplies will be purchased.  Appointment availability is limited, so if you need to cancel or reschedule, please call the Radiology Department at [phone]  24 hours in advance to avoid a cancellation fee of $100.00 ? ?What to Expect After you Arrive: ? ?Once you arrive and check in for your appointment, you will be taken to a preparation room within the Radiology Department.  A technologist or Nurse will obtain your medical history, verify that you are correctly prepped for the exam, and explain the procedure.  Afterwards,  an IV will be started in your arm and electrodes will be placed on your skin for EKG monitoring during the stress portion of the exam. Then you will be escorted to the PET/CT scanner.  There, staff will get you positioned on the scanner and obtain a blood pressure and EKG.  During the exam, you will continue to be connected to the EKG and blood pressure machines.  A small, safe amount of a radioactive tracer will be injected in your IV to obtain a series of pictures  of your heart along with an injection of a stress agent.   ? ?After your Exam: ? ?It is recommended that you eat a meal and drink a caffeinated beverage to counter act any effects of the stress agent.  Drink plenty of fluids for the remainder of the day and urinate frequently for the first couple of hours after the exam.  Your doctor will inform you of your test results within 7-10 business days. ? ?For questions about your test or how to  prepare for your test, please call: ?Marchia Bond, Cardiac Imaging Nurse Navigator  ?Gordy Clement, Cardiac Imaging Nurse Navigator ?Office: [phone]  ? ? ? ?Follow-Up: ?At [facility], you and your health needs are our priority.  As part of our continuing mission to provide you with exceptional heart care, we have created designated Provider Care Teams.  These Care Teams include your primary Cardiologist (physician) and Advanced Practice Providers (APPs -  Physician Assistants and Nurse Practitioners) who all work together to provide you with the care you need, when you need it. ? ?We recommend signing up for the patient portal called "MyChart".  Sign up information is provided on this After Visit Summary.  MyChart is used to connect with patients for Virtual Visits (Telemedicine).  Patients are able to view lab/test results, encounter notes, upcoming appointments, etc.  Non-urgent messages can be sent to your provider as well.   ?To learn more about what you can do with MyChart, go to [email or web address].   ? ?Your next appointment:   ?6-8 week(s) ? ?The format for your next appointment:   ?In Person ? ?Provider:   ?Diona Browner, NP      ? ? ?Other Instructions ? ? ?Important Information About Sugar ? ? ? ? ?  ?

## 2021-09-22 ENCOUNTER — Ambulatory Visit (INDEPENDENT_AMBULATORY_CARE_PROVIDER_SITE_OTHER): Payer: BC Managed Care – PPO | Admitting: Internal Medicine

## 2021-09-22 ENCOUNTER — Encounter: Payer: Self-pay | Admitting: Internal Medicine

## 2021-09-22 VITALS — BP 126/68 | HR 62 | Temp 97.7°F | Ht 70.0 in | Wt 186.0 lb

## 2021-09-22 DIAGNOSIS — H6121 Impacted cerumen, right ear: Secondary | ICD-10-CM

## 2021-09-22 DIAGNOSIS — R61 Generalized hyperhidrosis: Secondary | ICD-10-CM

## 2021-09-22 DIAGNOSIS — E7800 Pure hypercholesterolemia, unspecified: Secondary | ICD-10-CM

## 2021-09-22 DIAGNOSIS — N401 Enlarged prostate with lower urinary tract symptoms: Secondary | ICD-10-CM

## 2021-09-22 DIAGNOSIS — R3912 Poor urinary stream: Secondary | ICD-10-CM

## 2021-09-22 DIAGNOSIS — Z0001 Encounter for general adult medical examination with abnormal findings: Secondary | ICD-10-CM | POA: Diagnosis not present

## 2021-09-22 DIAGNOSIS — R739 Hyperglycemia, unspecified: Secondary | ICD-10-CM

## 2021-09-22 DIAGNOSIS — Z125 Encounter for screening for malignant neoplasm of prostate: Secondary | ICD-10-CM | POA: Diagnosis not present

## 2021-09-22 DIAGNOSIS — I2510 Atherosclerotic heart disease of native coronary artery without angina pectoris: Secondary | ICD-10-CM

## 2021-09-22 DIAGNOSIS — Z23 Encounter for immunization: Secondary | ICD-10-CM

## 2021-09-22 DIAGNOSIS — I10 Essential (primary) hypertension: Secondary | ICD-10-CM | POA: Diagnosis not present

## 2021-09-22 DIAGNOSIS — E538 Deficiency of other specified B group vitamins: Secondary | ICD-10-CM

## 2021-09-22 DIAGNOSIS — E559 Vitamin D deficiency, unspecified: Secondary | ICD-10-CM | POA: Diagnosis not present

## 2021-09-22 LAB — URINALYSIS, ROUTINE W REFLEX MICROSCOPIC
Bilirubin Urine: NEGATIVE
Hgb urine dipstick: NEGATIVE
Ketones, ur: NEGATIVE
Leukocytes,Ua: NEGATIVE
Nitrite: NEGATIVE
RBC / HPF: NONE SEEN (ref 0–?)
Specific Gravity, Urine: 1.02 (ref 1.000–1.030)
Total Protein, Urine: NEGATIVE
Urine Glucose: NEGATIVE
Urobilinogen, UA: 0.2 (ref 0.0–1.0)
pH: 6 (ref 5.0–8.0)

## 2021-09-22 LAB — CBC WITH DIFFERENTIAL/PLATELET
Basophils Absolute: 0 10*3/uL (ref 0.0–0.1)
Basophils Relative: 0.6 % (ref 0.0–3.0)
Eosinophils Absolute: 0.2 10*3/uL (ref 0.0–0.7)
Eosinophils Relative: 4 % (ref 0.0–5.0)
HCT: 46.1 % (ref 39.0–52.0)
Hemoglobin: 15.7 g/dL (ref 13.0–17.0)
Lymphocytes Relative: 24.9 % (ref 12.0–46.0)
Lymphs Abs: 1.4 10*3/uL (ref 0.7–4.0)
MCHC: 34.2 g/dL (ref 30.0–36.0)
MCV: 90.8 fl (ref 78.0–100.0)
Monocytes Absolute: 0.5 10*3/uL (ref 0.1–1.0)
Monocytes Relative: 8.6 % (ref 3.0–12.0)
Neutro Abs: 3.4 10*3/uL (ref 1.4–7.7)
Neutrophils Relative %: 61.9 % (ref 43.0–77.0)
Platelets: 205 10*3/uL (ref 150.0–400.0)
RBC: 5.08 Mil/uL (ref 4.22–5.81)
RDW: 13.5 % (ref 11.5–15.5)
WBC: 5.4 10*3/uL (ref 4.0–10.5)

## 2021-09-22 LAB — HEMOGLOBIN A1C: Hgb A1c MFr Bld: 5.8 % (ref 4.6–6.5)

## 2021-09-22 LAB — VITAMIN D 25 HYDROXY (VIT D DEFICIENCY, FRACTURES): VITD: 29.14 ng/mL — ABNORMAL LOW (ref 30.00–100.00)

## 2021-09-22 LAB — PSA: PSA: 1.91 ng/mL (ref 0.10–4.00)

## 2021-09-22 LAB — TSH: TSH: 1.54 u[IU]/mL (ref 0.35–5.50)

## 2021-09-22 LAB — VITAMIN B12: Vitamin B-12: 550 pg/mL (ref 211–911)

## 2021-09-22 MED ORDER — ALUMINUM CHLORIDE 20 % EX SOLN: Freq: Every day | CUTANEOUS | 2 refills | Status: DC

## 2021-09-22 NOTE — Patient Instructions (Addendum)
You had the Shingrix shingels shot today ? ?Please return for a Nurse Visit in 2-6 months for shot #2 ? ?Your right ear was irrigated of wax today ? ?Please continue all other medications as before, and refills have been done if requested - the drysol ? ?Please have the pharmacy call with any other refills you may need. ? ?Please continue your efforts at being more active, low cholesterol diet, and weight control. ? ?You are otherwise up to date with prevention measures today. ? ?Please keep your appointments with your specialists as you may have planned - cardiology scans soon ? ?Please go to the LAB at the blood drawing area for the tests to be done ? ?You will be contacted by phone if any changes need to be made immediately.  Otherwise, you will receive a letter about your results with an explanation, but please check with MyChart first. ? ?Please remember to sign up for MyChart if you have not done so, as this will be important to you in the future with finding out test results, communicating by private email, and scheduling acute appointments online when needed. ? ?Please make an Appointment to return for your 1 year visit, or sooner if needed, with Lab testing by Appointment as well, to be done about 3-5 days before at the [facility] (so this is for TWO appointments - please see the scheduling desk as you leave) ? ? ?Due to the ongoing Covid 19 pandemic, our lab now requires an appointment for any labs done at our office.  If you need labs done and do not have an appointment, please call our office ahead of time to schedule before presenting to the lab for your testing. ? ? ? ? ? ?

## 2021-09-22 NOTE — Progress Notes (Signed)
Patient ID: Richard Roach, male   DOB: June 06, 1955, 66 y.o.   MRN: [id number]         Chief Complaint:: wellness exam and hyerpyhidrosis, elevated cor calcium score, BPH with nocturia, right hearing loss       HPI:  Richard Roach is a 66 y.o. male here for wellness exam; for shingles shot #1 today, covid booster, o/w up to date                        Also has echo tomorrow, and PET CT perfusion study in June 6 with hx of elevated cor calcium .  Does have ongoing anxiety, and recent worsening hyperhidrosis in the past month, did better with drysol in past, asks for restart.   Denies urinary symptoms such as dysuria, frequency, urgency, flank pain, hematuria or n/v, fever, chills, but has mild uriinary slow flow and mild retention, pt not sure if wants to start flomax.  Does have reduced hearing right ear in the past week, without HA, fever, chills, ear d/c , sinus pain , ST or cough.     Wt Readings from Last 3 Encounters:  09/22/21 186 lb (84.4 kg)  09/05/21 188 lb (85.3 kg)  04/18/21 189 lb (85.7 kg)   BP Readings from Last 3 Encounters:  09/22/21 126/68  09/05/21 122/82  04/18/21 126/70   Immunization History  Administered Date(s) Administered   DTP 04/10/1998   Hep A / Hep B 11/01/2014, 04/10/2015   Hepatitis B, adult 11/22/2014   Influenza Split 03/11/2013   Influenza Whole 05/11/2009, 03/11/2010, 01/10/2011, 03/11/2012   Influenza,inj,Quad PF,6+ Mos 01/19/2014, 02/20/2018, 01/24/2019, 02/22/2020   Influenza-Unspecified 02/09/2015   PFIZER(Purple Top)SARS-COV-2 Vaccination 06/01/2019, 06/22/2019, 03/26/2020   Pneumococcal Polysaccharide-23 05/12/2003   Td 09/23/2007   Tdap 05/06/2013, 07/11/2020   Zoster Recombinat (Shingrix) 12/11/2019, 02/09/2020, 09/22/2021   There are no preventive care reminders to display for this patient.     Past Medical History:  Diagnosis Date   Allergic rhinitis    Anxiety state 05/02/2015   Chronic RLQ pain 05/02/2015   Colon polyps    Coronary artery  disease    Fatty liver    GERD (gastroesophageal reflux disease)    Hypercholesterolemia    Hypertension    Inguinal hernia    Internal hemorrhoids    Liver fibrosis 05/02/2015   PVC's (premature ventricular contractions)    Serrated adenoma of colon    Sinus bradycardia    Past Surgical History:  Procedure Laterality Date   KNEE ARTHROSCOPY Right June 2011   KNEE ARTHROSCOPY Left 2012   TONSILLECTOMY      reports that he quit smoking about 23 years ago. His smoking use included cigarettes. He has never used smokeless tobacco. He reports current alcohol use of about 14.0 standard drinks per week. He reports that he does not use drugs. family history includes Asthma in his father; High blood pressure in his mother. No Known Allergies Current Outpatient Medications on File Prior to Visit  Medication Sig Dispense Refill   Ascorbic Acid (VITAMIN C) 1000 MG tablet Take 1,000 mg by mouth daily.     aspirin EC 81 MG tablet Take 1 tablet (81 mg total) by mouth daily. 90 tablet 3   ezetimibe (ZETIA) 10 MG tablet TAKE ONE TABLET BY MOUTH ONE TIME DAILY 90 tablet 0   metoprolol succinate (TOPROL-XL) 25 MG 24 hr tablet Take 1 tablet (25 mg total) by mouth daily. 90 tablet 3  Multiple Vitamin (MULTIVITAMIN) capsule Take 1 capsule by mouth daily.     rosuvastatin (CRESTOR) 40 MG tablet TAKE ONE TABLET BY MOUTH ONE TIME DAILY 90 tablet 1   sildenafil (VIAGRA) 100 MG tablet 1 tab by mouth one daily as needed 5 tablet 11   No current facility-administered medications on file prior to visit.        ROS:  All others reviewed and negative.  Objective        PE:  BP 126/68 (BP Location: Right Arm, Patient Position: Sitting, Cuff Size: Large)   Pulse 62   Temp 97.7 F (36.5 C) (Oral)   Ht '5\' 10"'$  (1.778 m)   Wt 186 lb (84.4 kg)   SpO2 96%   BMI 26.69 kg/m                 Constitutional: Pt appears in NAD               HENT: Head: NCAT.                Right Ear: External ear normal.  Right  canal wax impaction resolved with irrigation, hearing improved               Left Ear: External ear normal.                Eyes: . Pupils are equal, round, and reactive to light. Conjunctivae and EOM are normal               Nose: without d/c or deformity               Neck: Neck supple. Gross normal ROM               Cardiovascular: Normal rate and regular rhythm.                 Pulmonary/Chest: Effort normal and breath sounds without rales or wheezing.                Abd:  Soft, NT, ND, + BS, no organomegaly               Neurological: Pt is alert. At baseline orientation, motor grossly intact               Skin: Skin is warm. No rashes, no other new lesions, LE edema - none               Psychiatric: Pt behavior is normal without agitation   Micro: none  Cardiac tracings I have personally interpreted today:  none  Pertinent Radiological findings (summarize): none   Lab Results  Component Value Date   WBC 5.4 09/22/2021   HGB 15.7 09/22/2021   HCT 46.1 09/22/2021   PLT 205.0 09/22/2021   GLUCOSE 102 (H) 09/22/2021   CHOL 155 09/22/2021   TRIG 87.0 09/22/2021   HDL 63.00 09/22/2021   LDLDIRECT 164.0 04/18/2012   LDLCALC 74 09/22/2021   ALT 21 09/22/2021   AST 18 09/22/2021   NA 140 09/22/2021   K 4.1 09/22/2021   CL 106 09/22/2021   CREATININE 0.93 09/22/2021   BUN 22 09/22/2021   CO2 24 09/22/2021   TSH 1.54 09/22/2021   PSA 1.91 09/22/2021   HGBA1C 5.8 09/22/2021   Assessment/Plan:  Richard Roach is a 66 y.o. White or Caucasian [1] male with  has a past medical history of Allergic rhinitis, Anxiety state (05/02/2015), Chronic RLQ pain (05/02/2015), Colon polyps, Coronary  artery disease, Fatty liver, GERD (gastroesophageal reflux disease), Hypercholesterolemia, Hypertension, Inguinal hernia, Internal hemorrhoids, Liver fibrosis (05/02/2015), PVC's (premature ventricular contractions), Serrated adenoma of colon, and Sinus bradycardia.  Encounter for well adult exam with  abnormal findings Age and sex appropriate education and counseling updated with regular exercise and diet Referrals for preventative services - none needed Immunizations addressed - for shingrix #1 (and #2 in 2 months), declines covid boosterSmoking counseling  - none needed Evidence for depression or other mood disorder - none significant Most recent labs reviewed. I have personally reviewed and have noted: 1) the patient's medical and social history 2) The patient's current medications and supplements 3) The patient's height, weight, and BMI have been recorded in the chart   Essential hypertension BP Readings from Last 3 Encounters:  09/22/21 126/68  09/05/21 122/82  04/18/21 126/70   Stable, pt to continue medical treatment toprol   Hypercholesterolemia Lab Results  Component Value Date   LDLCALC 74 09/22/2021   Mild uncontrolled, goal ldl <70, pt to continue current zetia 10, crestor 40 as declines change, also for lower chol diet   Hyperglycemia Lab Results  Component Value Date   HGBA1C 5.8 09/22/2021   Stable, pt to continue current medical treatment  - diet   HYPERHIDROSIS Uncontrolled mild worsening, for drysol prn  Impacted cerumen of right ear Right canal wax impaction resolved with irrigation, hearing improved  Ceruminosis is noted.  Wax is removed by syringing and manual debridement. Instructions for home care to prevent wax buildup are given.   Benign prostatic hyperplasia D/w pt - declines flomax for now,  to f/u any worsening symptoms or concerns  Coronary artery calcification seen on CT scan For f/u echo and PET CT soon  Vitamin D deficiency Last vitamin D Lab Results  Component Value Date   VD25OH 29.14 (L) 09/22/2021   Low, to start oral replacement   Followup: Return in about 1 year (around 09/23/2022).  Cathlean Cower, MD 09/25/2021 6:53 PM [facility]

## 2021-09-22 NOTE — Progress Notes (Signed)
Patient consent obtained. Irrigation with water and peroxide performed on right ear. Full view of tympanic membranes after procedure.  Patient tolerated procedure well.

## 2021-09-23 ENCOUNTER — Encounter: Payer: Self-pay | Admitting: Cardiovascular Disease

## 2021-09-23 ENCOUNTER — Ambulatory Visit (HOSPITAL_COMMUNITY): Payer: BC Managed Care – PPO | Attending: Internal Medicine

## 2021-09-23 DIAGNOSIS — R0602 Shortness of breath: Secondary | ICD-10-CM | POA: Insufficient documentation

## 2021-09-23 DIAGNOSIS — I2510 Atherosclerotic heart disease of native coronary artery without angina pectoris: Secondary | ICD-10-CM | POA: Diagnosis not present

## 2021-09-23 DIAGNOSIS — I10 Essential (primary) hypertension: Secondary | ICD-10-CM | POA: Insufficient documentation

## 2021-09-23 LAB — ECHOCARDIOGRAM COMPLETE
Area-P 1/2: 4.15 cm2
S' Lateral: 3.1 cm

## 2021-09-23 LAB — BASIC METABOLIC PANEL
BUN: 22 mg/dL (ref 6–23)
CO2: 24 mEq/L (ref 19–32)
Calcium: 9.4 mg/dL (ref 8.4–10.5)
Chloride: 106 mEq/L (ref 96–112)
Creatinine, Ser: 0.93 mg/dL (ref 0.40–1.50)
GFR: 86.79 mL/min (ref >60.00)
Glucose, Bld: 102 mg/dL — ABNORMAL HIGH (ref 70–99)
Potassium: 4.1 mEq/L (ref 3.5–5.1)
Sodium: 140 mEq/L (ref 135–145)

## 2021-09-23 LAB — LIPID PANEL
Cholesterol: 155 mg/dL (ref 0–200)
HDL: 63 mg/dL (ref >39.00)
LDL Cholesterol: 74 mg/dL (ref 0–99)
NonHDL: 91.77
Total CHOL/HDL Ratio: 2
Triglycerides: 87 mg/dL (ref 0.0–149.0)
VLDL: 17.4 mg/dL (ref 0.0–40.0)

## 2021-09-23 LAB — HEPATIC FUNCTION PANEL
ALT: 21 U/L (ref 0–53)
AST: 18 U/L (ref 0–37)
Albumin: 4.4 g/dL (ref 3.5–5.2)
Alkaline Phosphatase: 60 U/L (ref 39–117)
Bilirubin, Direct: 0.2 mg/dL (ref 0.0–0.3)
Total Bilirubin: 0.8 mg/dL (ref 0.2–1.2)
Total Protein: 6.9 g/dL (ref 6.0–8.3)

## 2021-09-23 NOTE — Telephone Encounter (Signed)
Multiple encounters open. Will close this encounter.  

## 2021-09-23 NOTE — Telephone Encounter (Signed)
NOTE NOT NEEDED ?

## 2021-09-24 ENCOUNTER — Telehealth: Payer: Self-pay

## 2021-09-24 NOTE — Telephone Encounter (Signed)
Spoke with pt. Pt was notified of Echo results. Pt will proceed with Cardiac PET Scan.  ?

## 2021-09-25 ENCOUNTER — Encounter: Payer: Self-pay | Admitting: Internal Medicine

## 2021-09-25 DIAGNOSIS — E559 Vitamin D deficiency, unspecified: Secondary | ICD-10-CM | POA: Insufficient documentation

## 2021-09-25 NOTE — Assessment & Plan Note (Signed)
Age and sex appropriate education and counseling updated with regular exercise and diet Referrals for preventative services - none needed Immunizations addressed - for shingrix #1 (and #2 in 2 months), declines covid boosterSmoking counseling  - none needed Evidence for depression or other mood disorder - none significant Most recent labs reviewed. I have personally reviewed and have noted: 1) the patient's medical and social history 2) The patient's current medications and supplements 3) The patient's height, weight, and BMI have been recorded in the chart

## 2021-09-25 NOTE — Assessment & Plan Note (Signed)
Last vitamin D Lab Results  Component Value Date   VD25OH 29.14 (L) 09/22/2021   Low, to start oral replacement

## 2021-09-25 NOTE — Assessment & Plan Note (Signed)
Uncontrolled mild worsening, for drysol prn

## 2021-09-25 NOTE — Assessment & Plan Note (Signed)
For f/u echo and PET CT soon

## 2021-09-25 NOTE — Assessment & Plan Note (Signed)
BP Readings from Last 3 Encounters:  09/22/21 126/68  09/05/21 122/82  04/18/21 126/70   Stable, pt to continue medical treatment toprol

## 2021-09-25 NOTE — Assessment & Plan Note (Signed)
Lab Results  Component Value Date   LDLCALC 74 09/22/2021   Mild uncontrolled, goal ldl <70, pt to continue current zetia 10, crestor 40 as declines change, also for lower chol diet

## 2021-09-25 NOTE — Assessment & Plan Note (Signed)
Lab Results  Component Value Date   HGBA1C 5.8 09/22/2021   Stable, pt to continue current medical treatment  - diet

## 2021-09-25 NOTE — Assessment & Plan Note (Signed)
D/w pt - declines flomax for now,  to f/u any worsening symptoms or concerns

## 2021-09-25 NOTE — Assessment & Plan Note (Signed)
Right canal wax impaction resolved with irrigation, hearing improved  Ceruminosis is noted.  Wax is removed by syringing and manual debridement. Instructions for home care to prevent wax buildup are given.

## 2021-10-07 DIAGNOSIS — H5203 Hypermetropia, bilateral: Secondary | ICD-10-CM | POA: Diagnosis not present

## 2021-10-07 DIAGNOSIS — H2513 Age-related nuclear cataract, bilateral: Secondary | ICD-10-CM | POA: Diagnosis not present

## 2021-10-07 DIAGNOSIS — H43813 Vitreous degeneration, bilateral: Secondary | ICD-10-CM | POA: Diagnosis not present

## 2021-10-07 DIAGNOSIS — H43393 Other vitreous opacities, bilateral: Secondary | ICD-10-CM | POA: Diagnosis not present

## 2021-10-13 ENCOUNTER — Telehealth (HOSPITAL_COMMUNITY): Payer: Self-pay | Admitting: *Deleted

## 2021-10-13 ENCOUNTER — Telehealth (HOSPITAL_COMMUNITY): Payer: Self-pay | Admitting: Emergency Medicine

## 2021-10-13 NOTE — Telephone Encounter (Signed)
Attempted to call patient regarding upcoming cardiac PET appointment. Left message on voicemail with name and callback number and reminded patient to not consume caffeine after 8pm this evening.  Gordy Clement RN Navigator Cardiac Imaging [facility] [phone] Office [phone] Cell

## 2021-10-13 NOTE — Telephone Encounter (Signed)
Reaching out to patient to offer assistance regarding upcoming cardiac imaging study; pt verbalizes understanding of appt date/time, parking situation and where to check in, pre-test NPO status and medications ordered, and verified current allergies; name and call back number provided for further questions should they arise Marchia Bond RN Navigator Cardiac Imaging [facility] [phone] office [phone] cell   Holding metoprolol, viagra Avoiding caffeine  Arrival 700 at [facility] main entrance

## 2021-10-14 ENCOUNTER — Ambulatory Visit (HOSPITAL_COMMUNITY)
Admission: RE | Admit: 2021-10-14 | Discharge: 2021-10-14 | Disposition: A | Discharge status: Home / Self Care | Payer: BC Managed Care – PPO | Source: Ambulatory Visit | Attending: Nurse Practitioner | Admitting: Nurse Practitioner

## 2021-10-14 DIAGNOSIS — R0602 Shortness of breath: Secondary | ICD-10-CM | POA: Diagnosis not present

## 2021-10-14 DIAGNOSIS — I10 Essential (primary) hypertension: Secondary | ICD-10-CM

## 2021-10-14 DIAGNOSIS — I2510 Atherosclerotic heart disease of native coronary artery without angina pectoris: Secondary | ICD-10-CM | POA: Diagnosis not present

## 2021-10-14 LAB — NM PET CT CARDIAC PERFUSION MULTI W/ABSOLUTE BLOODFLOW
LV dias vol: 131 mL (ref 62–150)
LV sys vol: 34 mL
MBFR: 2.26
Nuc Rest EF: 65 %
Nuc Stress EF: 74 %
Rest MBF: 1.06 ml/g/min
Rest Nuclear Isotope Dose: 22 mCi
Rest perfusion cavity size (mL): 131 mL
ST Depression (mm): 0 mm
Stress MBF: 2.4 ml/g/min
Stress Nuclear Isotope Dose: 22 mCi
Stress perfusion cavity size (mL): 121 mL
TID: 0.99

## 2021-10-14 MED ORDER — RUBIDIUM RB82 GENERATOR (RUBYFILL)
22.0000 | PACK | Freq: Once | INTRAVENOUS | Status: AC
  Administered 2021-10-14: 22 via INTRAVENOUS

## 2021-10-14 MED ORDER — REGADENOSON 0.4 MG/5ML IV SOLN
INTRAVENOUS | Status: AC
  Filled 2021-10-14: qty 5

## 2021-10-14 MED ORDER — RUBIDIUM RB82 GENERATOR (RUBYFILL)
21.9700 | PACK | Freq: Once | INTRAVENOUS | Status: AC
  Administered 2021-10-14: 21.97 via INTRAVENOUS

## 2021-10-17 ENCOUNTER — Telehealth: Payer: Self-pay

## 2021-10-17 NOTE — Telephone Encounter (Signed)
Spoke with pt. Pt was notified of Cardiac PET stress test. Pt will continue his current medications and f/u as planned 10/09/2021.

## 2021-10-21 ENCOUNTER — Ambulatory Visit: Payer: BC Managed Care – PPO | Admitting: Nurse Practitioner

## 2021-10-23 NOTE — Progress Notes (Signed)
Office Visit    Patient Name: Richard Roach Date of Encounter: 10/24/2021  Primary Care Provider:  Biagio Borg, MD Primary Cardiologist:  Evalina Field, MD  Chief Complaint    66 year old male with a history of CAD, hypertension, hyperlipidemia, palpitations, PACs/PVCs, GERD and anxiety who presents for follow-up related to CAD.   Past Medical History    Past Medical History:  Diagnosis Date   Allergic rhinitis    Anxiety state 05/02/2015   Chronic RLQ pain 05/02/2015   Colon polyps    Coronary artery disease    Fatty liver    GERD (gastroesophageal reflux disease)    Hypercholesterolemia    Hypertension    Inguinal hernia    Internal hemorrhoids    Liver fibrosis 05/02/2015   PVC's (premature ventricular contractions)    Serrated adenoma of colon    Sinus bradycardia    Past Surgical History:  Procedure Laterality Date   KNEE ARTHROSCOPY Right June 2011   KNEE ARTHROSCOPY Left 2012   TONSILLECTOMY      Allergies  No Known Allergies  History of Present Illness    66 year old male with the above past medical history including CAD, hypertension, hyperlipidemia, palpitations, PACs/PVCs, GERD and anxiety.   Echocardiogram in 2014 showed EF 65 to 70%, G2 DD, trivial mitral valve regurgitation.  ETT in 2018 showed no evidence of ischemia.  Coronary calcium score in May 2021 was 52, 90th percentile for age and sex matched controls.  Follow-up ETT in June 2021 is low risk. Cardiac monitor in the setting of increased palpitations in June 2022 showed rare PACs/PVCs, no significant arrhythmias. Palpitations were primarily associated with stress. He was last seen in the office on 09/05/2021 and reported a 4 months history of increased shortness of breath with activity, generalized fatigue.  Repeat echocardiogram showed EF 60 to 65%, no RWMA, normal LV diastolic parameters, abnormal global longitudinal strain, no significant valvular abnormalities.  Cardiac PET stress test was  low risk, no evidence of ischemia, it did show significant coronary artery calcification in the LAD, left circumflex, and right coronary arteries, aortic atherosclerosis.   He presents today for follow-up accompanied by his daughter.  Since his last visit he has done well from a cardiac standpoint. He states that he has felt a large amount of anxiety over his test results and is concerned that he is going to have a heart attack and die.  Occasional dizziness when he bends down to pick things up.  He is concerned that his BP has been borderline low at times, HR has been in the low 60s, high 50s.  He is exercising regularly, working on a heart healthy diet.  Overall, he reports feeling well and denies any additional concerns today.  Home Medications    Current Outpatient Medications  Medication Sig Dispense Refill   aluminum chloride (DRYSOL) 20 % external solution Apply topically at bedtime. 60 mL 2   Ascorbic Acid (VITAMIN C) 1000 MG tablet Take 1,000 mg by mouth daily.     aspirin EC 81 MG tablet Take 1 tablet (81 mg total) by mouth daily. 90 tablet 3   ezetimibe (ZETIA) 10 MG tablet TAKE ONE TABLET BY MOUTH ONE TIME DAILY 90 tablet 0   Multiple Vitamin (MULTIVITAMIN) capsule Take 1 capsule by mouth daily.     rosuvastatin (CRESTOR) 40 MG tablet TAKE ONE TABLET BY MOUTH ONE TIME DAILY 90 tablet 1   sildenafil (VIAGRA) 100 MG tablet 1 tab by mouth  one daily as needed 5 tablet 11   metoprolol succinate (TOPROL-XL) 25 MG 24 hr tablet Take 0.5 tablets (12.5 mg total) by mouth daily. 45 tablet 3   No current facility-administered medications for this visit.     Review of Systems    He denies chest pain, palpitations, dyspnea, pnd, orthopnea, n, v, dizziness, syncope, edema, weight gain, or early satiety. All other systems reviewed and are otherwise negative except as noted above.   Physical Exam    VS:  BP 132/80   Pulse 72   Ht '5\' 10"'$  (1.778 m)   Wt 184 lb 3.2 oz (83.6 kg)   BMI 26.43  kg/m  GEN: Well nourished, well developed, in no acute distress. HEENT: normal. Neck: Supple, no JVD, carotid bruits, or masses. Cardiac: RRR, no murmurs, rubs, or gallops. No clubbing, cyanosis, edema.  Radials/DP/PT 2+ and equal bilaterally.  Respiratory:  Respirations regular and unlabored, clear to auscultation bilaterally. GI: Soft, nontender, nondistended, BS + x 4. MS: no deformity or atrophy. Skin: warm and dry, no rash. Neuro:  Strength and sensation are intact. Psych: Normal affect.  Accessory Clinical Findings    ECG personally reviewed by me today - No EKG in office today.  Lab Results  Component Value Date   WBC 5.4 09/22/2021   HGB 15.7 09/22/2021   HCT 46.1 09/22/2021   MCV 90.8 09/22/2021   PLT 205.0 09/22/2021   Lab Results  Component Value Date   CREATININE 0.93 09/22/2021   BUN 22 09/22/2021   NA 140 09/22/2021   K 4.1 09/22/2021   CL 106 09/22/2021   CO2 24 09/22/2021   Lab Results  Component Value Date   ALT 21 09/22/2021   AST 18 09/22/2021   ALKPHOS 60 09/22/2021   BILITOT 0.8 09/22/2021   Lab Results  Component Value Date   CHOL 155 09/22/2021   HDL 63.00 09/22/2021   LDLCALC 74 09/22/2021   LDLDIRECT 164.0 04/18/2012   TRIG 87.0 09/22/2021   CHOLHDL 2 09/22/2021    Lab Results  Component Value Date   HGBA1C 5.8 09/22/2021    Assessment & Plan    1. CAD/shortness of breath: Echocardiogram in 2014 showed EF 65 to 70%, G2 DD, trivial mitral valve   regurgitation. Coronary calcium score in May 2021 was 37, 90th percentile for age and sex matched controls. Follow-up ETT in June 2021 is low risk. Over the past 4 months he has noted increased shortness of breath with activity, generalized fatigue. He denies chest pain, edema, PND, orthopnea, weight gain. Other than daytime fatigue, he denies symptoms concerning for sleep apnea.  Repeat echocardiogram showed EF 60 to 65%, no RWMA, normal LV diastolic parameters, abnormal global longitudinal  strain, no significant valvular abnormalities.  Cardiac PET stress test was low risk, no evidence of ischemia, it did show significant coronary artery calcification in the LAD, left circumflex, and right coronary arteries, aortic atherosclerosis. Stable with no anginal symptoms.  He does report a lot of anxiety about his heart disease.  Discussed anxiety management strategies, offered counseling.  Patient declines.  Advised him to follow-up with his PCP if anxiety persists.  Continue aspirin, metoprolol as below, Crestor, and Zetia.  2. Hypertension: BP well controlled, occasionally soft BP at home.  We will decrease metoprolol to 12.5 mg daily as below.  3. Hyperlipidemia: LDL was 74 in /2023.  Continue Crestor, Zetia. If LDL remains elevated above 70, consider lipid clinic referral for further management.   4.  Palpitations: Cardiac monitor in the setting of increased palpitations in June 2022 showed rare PACs/PVCs, no significant arrhythmias.  He denies any recent palpitations.  He does note occasional lightheadedness.  HR has been in the low 60s, high 50s at home.  BP occasionally soft in the 100-110s.  Patient asked about decreasing metoprolol.  Ok to decrease metoprolol to 12.5 mg daily.  Continue to monitor HR and BP.  5. Disposition: Follow-up as scheduled with Dr. Audie Box in 12/2021.    Lenna Sciara, NP 10/24/2021, 8:29 AM

## 2021-10-24 ENCOUNTER — Ambulatory Visit (INDEPENDENT_AMBULATORY_CARE_PROVIDER_SITE_OTHER): Payer: BC Managed Care – PPO | Admitting: Nurse Practitioner

## 2021-10-24 ENCOUNTER — Encounter: Payer: Self-pay | Admitting: Nurse Practitioner

## 2021-10-24 VITALS — BP 132/80 | HR 72 | Ht 70.0 in | Wt 184.2 lb

## 2021-10-24 DIAGNOSIS — I2510 Atherosclerotic heart disease of native coronary artery without angina pectoris: Secondary | ICD-10-CM | POA: Diagnosis not present

## 2021-10-24 DIAGNOSIS — I10 Essential (primary) hypertension: Secondary | ICD-10-CM | POA: Diagnosis not present

## 2021-10-24 DIAGNOSIS — E785 Hyperlipidemia, unspecified: Secondary | ICD-10-CM

## 2021-10-24 DIAGNOSIS — R002 Palpitations: Secondary | ICD-10-CM

## 2021-10-24 DIAGNOSIS — R0602 Shortness of breath: Secondary | ICD-10-CM

## 2021-10-24 MED ORDER — METOPROLOL SUCCINATE ER 25 MG PO TB24: 12.5000 mg | ORAL_TABLET | Freq: Every day | ORAL | 3 refills | Status: DC

## 2021-10-24 NOTE — Patient Instructions (Signed)
Medication Instructions:  DECREASE Metoprolol Succinate to 12.5 mg daily  *If you need a refill on your cardiac medications before your next appointment, please call your pharmacy*  Lab Work: NONE ordered at this time of appointment   If you have labs (blood work) drawn today and your tests are completely normal, you will receive your results only by: [place] (if you have MyChart) OR A paper copy in the mail If you have any lab test that is abnormal or we need to change your treatment, we will call you to review the results.  Testing/Procedures: NONE ordered at this time of appointment   Follow-Up: At [facility], you and your health needs are our priority.  As part of our continuing mission to provide you with exceptional heart care, we have created designated Provider Care Teams.  These Care Teams include your primary Cardiologist (physician) and Advanced Practice Providers (APPs -  Physician Assistants and Nurse Practitioners) who all work together to provide you with the care you need, when you need it.   Your next appointment:   As previously scheduled    The format for your next appointment:   In Person  Provider:   Evalina Field, MD     Other Instructions CONTINUE to monitor Blood Pressure (BP) and Heart Rate/Pulse (HR)  at home   Important Information About Sugar

## 2021-10-30 ENCOUNTER — Emergency Department (HOSPITAL_COMMUNITY): Payer: BC Managed Care – PPO

## 2021-10-30 ENCOUNTER — Other Ambulatory Visit: Payer: Self-pay

## 2021-10-30 ENCOUNTER — Emergency Department (HOSPITAL_COMMUNITY)
Admission: EM | Admit: 2021-10-30 | Discharge: 2021-10-30 | Disposition: A | Discharge status: Home / Self Care | Payer: BC Managed Care – PPO | Attending: Emergency Medicine | Admitting: Emergency Medicine

## 2021-10-30 ENCOUNTER — Encounter (HOSPITAL_COMMUNITY): Payer: Self-pay

## 2021-10-30 DIAGNOSIS — S00511A Abrasion of lip, initial encounter: Secondary | ICD-10-CM | POA: Diagnosis not present

## 2021-10-30 DIAGNOSIS — S12501A Unspecified nondisplaced fracture of sixth cervical vertebra, initial encounter for closed fracture: Secondary | ICD-10-CM | POA: Diagnosis not present

## 2021-10-30 DIAGNOSIS — S0081XA Abrasion of other part of head, initial encounter: Secondary | ICD-10-CM | POA: Diagnosis not present

## 2021-10-30 DIAGNOSIS — Z7982 Long term (current) use of aspirin: Secondary | ICD-10-CM | POA: Insufficient documentation

## 2021-10-30 DIAGNOSIS — S5001XA Contusion of right elbow, initial encounter: Secondary | ICD-10-CM | POA: Diagnosis not present

## 2021-10-30 DIAGNOSIS — I10 Essential (primary) hypertension: Secondary | ICD-10-CM | POA: Insufficient documentation

## 2021-10-30 DIAGNOSIS — R220 Localized swelling, mass and lump, head: Secondary | ICD-10-CM | POA: Diagnosis not present

## 2021-10-30 DIAGNOSIS — M47812 Spondylosis without myelopathy or radiculopathy, cervical region: Secondary | ICD-10-CM | POA: Diagnosis not present

## 2021-10-30 DIAGNOSIS — S0031XA Abrasion of nose, initial encounter: Secondary | ICD-10-CM | POA: Insufficient documentation

## 2021-10-30 DIAGNOSIS — T07XXXA Unspecified multiple injuries, initial encounter: Secondary | ICD-10-CM

## 2021-10-30 DIAGNOSIS — I700 Atherosclerosis of aorta: Secondary | ICD-10-CM | POA: Diagnosis not present

## 2021-10-30 DIAGNOSIS — Y92410 Unspecified street and highway as the place of occurrence of the external cause: Secondary | ICD-10-CM | POA: Diagnosis not present

## 2021-10-30 DIAGNOSIS — S12590A Other displaced fracture of sixth cervical vertebra, initial encounter for closed fracture: Secondary | ICD-10-CM | POA: Diagnosis not present

## 2021-10-30 DIAGNOSIS — Z79899 Other long term (current) drug therapy: Secondary | ICD-10-CM | POA: Diagnosis not present

## 2021-10-30 DIAGNOSIS — S199XXA Unspecified injury of neck, initial encounter: Secondary | ICD-10-CM | POA: Diagnosis not present

## 2021-10-30 DIAGNOSIS — M2578 Osteophyte, vertebrae: Secondary | ICD-10-CM | POA: Diagnosis not present
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
## 2021-10-30 NOTE — Discharge Instructions (Addendum)
Wear the cervical collar when you are up and out of bed.
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
## 2021-10-30 NOTE — ED Notes (Signed)
C-collar applied

## 2021-11-01 ENCOUNTER — Other Ambulatory Visit: Payer: Self-pay | Admitting: Internal Medicine

## 2021-11-03 ENCOUNTER — Telehealth: Payer: Self-pay
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
## 2021-11-05 ENCOUNTER — Other Ambulatory Visit: Payer: Self-pay | Admitting: Internal Medicine

## 2021-11-05 ENCOUNTER — Telehealth: Payer: Self-pay | Admitting: Internal Medicine

## 2021-11-05 MED ORDER — BUSPIRONE HCL 10 MG PO TABS: 10.0000 mg | ORAL_TABLET | Freq: Two times a day (BID) | ORAL | 1 refills | Status: DC

## 2021-11-05 NOTE — Telephone Encounter (Signed)
Ok with me 

## 2021-11-05 NOTE — Telephone Encounter (Signed)
Pt would like to tranfer care from Dr. Jenny Reichmann to Dr. Sharlet Salina. Would that be ok?

## 2021-11-06 NOTE — Telephone Encounter (Signed)
Patient notified of message as seen below

## 2021-11-08 DIAGNOSIS — S5011XA Contusion of right forearm, initial encounter: Secondary | ICD-10-CM | POA: Diagnosis not present

## 2021-11-10 NOTE — Telephone Encounter (Signed)
Fine

## 2021-11-13 DIAGNOSIS — S12591A Other nondisplaced fracture of sixth cervical vertebra, initial encounter for closed fracture: Secondary | ICD-10-CM | POA: Diagnosis not present

## 2021-11-17 ENCOUNTER — Telehealth: Payer: Self-pay | Admitting: Nurse Practitioner

## 2021-11-17 DIAGNOSIS — M25521 Pain in right elbow: Secondary | ICD-10-CM | POA: Diagnosis not present

## 2021-11-17 DIAGNOSIS — M25421 Effusion, right elbow: Secondary | ICD-10-CM | POA: Diagnosis not present

## 2021-11-17 MED ORDER — METOPROLOL SUCCINATE ER 25 MG PO TB24: 25.0000 mg | ORAL_TABLET | Freq: Every day | ORAL | 3 refills | Status: DC

## 2021-11-17 NOTE — Telephone Encounter (Addendum)
Lenna Sciara, NP  You; Derrick Ravel, CMA 8 minutes ago (11:02 AM)    Ok to refill metoprolol succinate 25 mg daily. Thanks  -EM      Sent new prescription for metoprolol succinate '25mg'$  once daily per Diona Browner, NP to pt's pharmacy of choice.

## 2021-11-17 NOTE — Telephone Encounter (Signed)
Spoke with pt regarding increased blood pressure readings since metoprolol succinate decreased to 12.'5mg'$  once daily after reporting dizziness and lower heart rates. Pt states that he was checking his blood pressure with ranges of 160-170 over 70-90. Pt states that last week he decided to go back to his original dose of '25mg'$  once daily. Pt reports that blood pressure has been better since change in medication around 120/70. Pt has not experienced any dizziness since increasing medication. Pt would like new prescription for metoprolol succinate '25mg'$  once daily. Will route to Diona Browner, NP to advise.

## 2021-11-17 NOTE — Telephone Encounter (Signed)
Pt c/o BP issue: STAT if pt c/o blurred vision, one-sided weakness or slurred speech  1. What are your last 5 BP readings? 170/90, 160/70  2. Are you having any other symptoms (ex. Dizziness, headache, blurred vision, passed out)? no  3. What is your BP issue? Patient states his BP medication was decreased and his BP has gone up.

## 2021-11-24 ENCOUNTER — Ambulatory Visit: Payer: BC Managed Care – PPO | Admitting: Internal Medicine

## 2021-11-28 ENCOUNTER — Ambulatory Visit: Payer: BC Managed Care – PPO | Admitting: Surgical

## 2021-11-28 DIAGNOSIS — S0083XA Contusion of other part of head, initial encounter: Secondary | ICD-10-CM

## 2021-11-28 DIAGNOSIS — S0083XS Contusion of other part of head, sequela: Secondary | ICD-10-CM
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
## 2021-12-01 ENCOUNTER — Other Ambulatory Visit: Payer: Self-pay | Admitting: Internal Medicine

## 2021-12-01 NOTE — Telephone Encounter (Signed)
To PCp please

## 2021-12-02 NOTE — Telephone Encounter (Signed)
Please refill as per office routine med refill policy (all routine meds to be refilled for 3 mo or monthly (per pt preference) up to one year from last visit, then month to month grace period for 3 mo, then further med refills will have to be denied) ? ?

## 2021-12-07 ENCOUNTER — Other Ambulatory Visit: Payer: Self-pay | Admitting: Internal Medicine

## 2021-12-07 NOTE — Telephone Encounter (Signed)
Please refill as per office routine med refill policy (all routine meds to be refilled for 3 mo or monthly (per pt preference) up to one year from last visit, then month to month grace period for 3 mo, then further med refills will have to be denied) ? ?

## 2021-12-09 ENCOUNTER — Ambulatory Visit: Payer: BC Managed Care – PPO | Admitting: Cardiovascular Disease

## 2021-12-10 ENCOUNTER — Ambulatory Visit: Payer: BC Managed Care – PPO

## 2021-12-14 DIAGNOSIS — J019 Acute sinusitis, unspecified: Secondary | ICD-10-CM | POA: Diagnosis not present

## 2022-01-10 DIAGNOSIS — U071 COVID-19: Secondary | ICD-10-CM | POA: Diagnosis not present

## 2022-01-26 DIAGNOSIS — D225 Melanocytic nevi of trunk: Secondary | ICD-10-CM | POA: Diagnosis not present

## 2022-01-26 DIAGNOSIS — Z1283 Encounter for screening for malignant neoplasm of skin: Secondary | ICD-10-CM | POA: Diagnosis not present

## 2022-02-06 DIAGNOSIS — K760 Fatty (change of) liver, not elsewhere classified: Secondary | ICD-10-CM | POA: Diagnosis not present

## 2022-02-10 DIAGNOSIS — M65332 Trigger finger, left middle finger: Secondary | ICD-10-CM | POA: Diagnosis not present

## 2022-05-29 DIAGNOSIS — R509 Fever, unspecified: Secondary | ICD-10-CM | POA: Diagnosis not present

## 2022-05-29 DIAGNOSIS — R051 Acute cough: Secondary | ICD-10-CM | POA: Diagnosis not present

## 2022-05-29 DIAGNOSIS — J09X2 Influenza due to identified novel influenza A virus with other respiratory manifestations: Secondary | ICD-10-CM | POA: Diagnosis not present

## 2022-05-29 DIAGNOSIS — Z20822 Contact with and (suspected) exposure to covid-19: Secondary | ICD-10-CM | POA: Diagnosis not present

## 2022-07-10 DIAGNOSIS — M545 Low back pain, unspecified: Secondary | ICD-10-CM | POA: Diagnosis not present

## 2022-07-10 DIAGNOSIS — M25552 Pain in left hip: Secondary | ICD-10-CM | POA: Diagnosis not present

## 2022-08-05 DIAGNOSIS — J069 Acute upper respiratory infection, unspecified: Secondary | ICD-10-CM | POA: Diagnosis not present

## 2022-08-05 DIAGNOSIS — R5383 Other fatigue: Secondary | ICD-10-CM | POA: Diagnosis not present

## 2022-08-05 DIAGNOSIS — R0981 Nasal congestion: Secondary | ICD-10-CM | POA: Diagnosis not present

## 2022-08-05 DIAGNOSIS — R051 Acute cough: Secondary | ICD-10-CM | POA: Diagnosis not present

## 2022-08-20 ENCOUNTER — Other Ambulatory Visit: Payer: Self-pay | Admitting: Internal Medicine

## 2022-08-24 ENCOUNTER — Encounter: Payer: Self-pay | Admitting: Gastroenterology

## 2022-08-26 ENCOUNTER — Other Ambulatory Visit: Payer: Self-pay | Admitting: Internal Medicine

## 2022-08-27 ENCOUNTER — Telehealth: Payer: Self-pay | Admitting: Internal Medicine

## 2022-08-27 MED ORDER — ROSUVASTATIN CALCIUM 40 MG PO TABS: 40.0000 mg | ORAL_TABLET | Freq: Every day | ORAL | 0 refills | Status: DC

## 2022-08-27 MED ORDER — EZETIMIBE 10 MG PO TABS: 10.0000 mg | ORAL_TABLET | Freq: Every day | ORAL | 0 refills | Status: DC

## 2022-08-27 NOTE — Telephone Encounter (Signed)
Sent over 90 day supply until appt on the 10/29/22 .Marland KitchenRaechel Chute

## 2022-08-27 NOTE — Telephone Encounter (Signed)
Prescription Request  08/27/2022  LOV: 09/22/2021  What is the name of the medication or equipment?  rosuvastatin (CRESTOR) 40 MG tablet  ezetimibe (ZETIA) 10 MG tablet  Have you contacted your pharmacy to request a refill? Yes   Which pharmacy would you like this sent to?  [facility] [address] [place], [place] - [address] AT [facility] [address] [address] [place]. [place] [address] Phone: [phone] Fax: [phone]    Patient notified that their request is being sent to the clinical staff for review and that they should receive a response within 2 business days.   Please advise at Mobile [phone] (mobile)   Next OV is 10/29/22

## 2022-09-03 ENCOUNTER — Telehealth: Payer: Self-pay

## 2022-09-03 NOTE — Telephone Encounter (Signed)
Dr.Stark, This patient has been followed by [facility] in 2023 for liver issues.  Does the patient need to have a "transfer of care" notation in the record prior to you completing his procedure for recall colon? Please advise Thanks Bre

## 2022-09-04 NOTE — Telephone Encounter (Signed)
He has been followed by a [facility] hepatologist for several years. He continues to receive his GI care with [place]. OK to schedule his colonoscopy with me.

## 2022-09-24 ENCOUNTER — Encounter: Payer: BC Managed Care – PPO | Admitting: Internal Medicine

## 2022-10-07 ENCOUNTER — Encounter: Payer: BC Managed Care – PPO | Admitting: Gastroenterology

## 2022-10-09 ENCOUNTER — Telehealth: Payer: Self-pay | Admitting: Gastroenterology

## 2022-10-09 NOTE — Telephone Encounter (Signed)
The pt has been advised that per Dr Russella Dar and current guidelines the he is not due until 09/2027.  He is ok with that change and will call if needed prior to then.

## 2022-10-09 NOTE — Telephone Encounter (Signed)
PT has recall for 09/2027 and wants to know why it was changed because he wants to do it now. Please advise.

## 2022-10-12 DIAGNOSIS — H43813 Vitreous degeneration, bilateral: Secondary | ICD-10-CM | POA: Diagnosis not present

## 2022-10-12 DIAGNOSIS — H52201 Unspecified astigmatism, right eye: Secondary | ICD-10-CM | POA: Diagnosis not present

## 2022-10-12 DIAGNOSIS — H5203 Hypermetropia, bilateral: Secondary | ICD-10-CM | POA: Diagnosis not present

## 2022-10-12 DIAGNOSIS — H2513 Age-related nuclear cataract, bilateral: Secondary | ICD-10-CM | POA: Diagnosis not present

## 2022-10-12 DIAGNOSIS — H524 Presbyopia: Secondary | ICD-10-CM | POA: Diagnosis not present

## 2022-10-12 DIAGNOSIS — H04123 Dry eye syndrome of bilateral lacrimal glands: Secondary | ICD-10-CM | POA: Diagnosis not present

## 2022-10-12 DIAGNOSIS — H43393 Other vitreous opacities, bilateral: Secondary | ICD-10-CM | POA: Diagnosis not present

## 2022-10-28 DIAGNOSIS — Z125 Encounter for screening for malignant neoplasm of prostate: Secondary | ICD-10-CM | POA: Diagnosis not present

## 2022-10-28 DIAGNOSIS — N5201 Erectile dysfunction due to arterial insufficiency: Secondary | ICD-10-CM | POA: Diagnosis not present

## 2022-10-28 DIAGNOSIS — R351 Nocturia: Secondary | ICD-10-CM | POA: Diagnosis not present

## 2022-10-28 DIAGNOSIS — N401 Enlarged prostate with lower urinary tract symptoms: Secondary | ICD-10-CM | POA: Diagnosis not present

## 2022-10-29 ENCOUNTER — Encounter: Payer: Self-pay | Admitting: Internal Medicine

## 2022-10-29 ENCOUNTER — Other Ambulatory Visit: Payer: Self-pay | Admitting: Internal Medicine

## 2022-10-29 ENCOUNTER — Ambulatory Visit (INDEPENDENT_AMBULATORY_CARE_PROVIDER_SITE_OTHER): Payer: Medicare HMO | Admitting: Internal Medicine

## 2022-10-29 VITALS — BP 118/72 | HR 60 | Temp 97.8°F | Ht 70.0 in | Wt 189.0 lb

## 2022-10-29 DIAGNOSIS — Z0000 Encounter for general adult medical examination without abnormal findings: Secondary | ICD-10-CM

## 2022-10-29 DIAGNOSIS — R739 Hyperglycemia, unspecified: Secondary | ICD-10-CM

## 2022-10-29 DIAGNOSIS — Z23 Encounter for immunization: Secondary | ICD-10-CM | POA: Diagnosis not present

## 2022-10-29 DIAGNOSIS — I10 Essential (primary) hypertension: Secondary | ICD-10-CM | POA: Diagnosis not present

## 2022-10-29 DIAGNOSIS — E559 Vitamin D deficiency, unspecified: Secondary | ICD-10-CM

## 2022-10-29 DIAGNOSIS — E7800 Pure hypercholesterolemia, unspecified: Secondary | ICD-10-CM | POA: Diagnosis not present

## 2022-10-29 DIAGNOSIS — Z0001 Encounter for general adult medical examination with abnormal findings: Secondary | ICD-10-CM

## 2022-10-29 DIAGNOSIS — E538 Deficiency of other specified B group vitamins: Secondary | ICD-10-CM

## 2022-10-29 LAB — LIPID PANEL
Cholesterol: 156 mg/dL (ref 0–200)
HDL: 59 mg/dL (ref >39.00)
LDL Cholesterol: 79 mg/dL (ref 0–99)
NonHDL: 96.94
Total CHOL/HDL Ratio: 3
Triglycerides: 90 mg/dL (ref 0.0–149.0)
VLDL: 18 mg/dL (ref 0.0–40.0)

## 2022-10-29 LAB — URINALYSIS, ROUTINE W REFLEX MICROSCOPIC
Bilirubin Urine: NEGATIVE
Hgb urine dipstick: NEGATIVE
Ketones, ur: NEGATIVE
Leukocytes,Ua: NEGATIVE
Nitrite: NEGATIVE
RBC / HPF: NONE SEEN (ref 0–?)
Specific Gravity, Urine: 1.01 (ref 1.000–1.030)
Total Protein, Urine: NEGATIVE
Urine Glucose: NEGATIVE
Urobilinogen, UA: 0.2 (ref 0.0–1.0)
WBC, UA: NONE SEEN (ref 0–?)
pH: 6.5 (ref 5.0–8.0)

## 2022-10-29 LAB — CBC WITH DIFFERENTIAL/PLATELET
Basophils Absolute: 0 10*3/uL (ref 0.0–0.1)
Basophils Relative: 0.8 % (ref 0.0–3.0)
Eosinophils Absolute: 0.2 10*3/uL (ref 0.0–0.7)
Eosinophils Relative: 3.1 % (ref 0.0–5.0)
HCT: 48.5 % (ref 39.0–52.0)
Hemoglobin: 16.1 g/dL (ref 13.0–17.0)
Lymphocytes Relative: 25.6 % (ref 12.0–46.0)
Lymphs Abs: 1.5 10*3/uL (ref 0.7–4.0)
MCHC: 33.2 g/dL (ref 30.0–36.0)
MCV: 91.8 fl (ref 78.0–100.0)
Monocytes Absolute: 0.5 10*3/uL (ref 0.1–1.0)
Monocytes Relative: 8.7 % (ref 3.0–12.0)
Neutro Abs: 3.6 10*3/uL (ref 1.4–7.7)
Neutrophils Relative %: 61.8 % (ref 43.0–77.0)
Platelets: 215 10*3/uL (ref 150.0–400.0)
RBC: 5.28 Mil/uL (ref 4.22–5.81)
RDW: 13.5 % (ref 11.5–15.5)
WBC: 5.9 10*3/uL (ref 4.0–10.5)

## 2022-10-29 LAB — HEPATIC FUNCTION PANEL
ALT: 23 U/L (ref 0–53)
AST: 20 U/L (ref 0–37)
Albumin: 4.4 g/dL (ref 3.5–5.2)
Alkaline Phosphatase: 59 U/L (ref 39–117)
Bilirubin, Direct: 0.2 mg/dL (ref 0.0–0.3)
Total Bilirubin: 0.7 mg/dL (ref 0.2–1.2)
Total Protein: 7.5 g/dL (ref 6.0–8.3)

## 2022-10-29 LAB — BASIC METABOLIC PANEL
BUN: 23 mg/dL (ref 6–23)
CO2: 26 mEq/L (ref 19–32)
Calcium: 9.5 mg/dL (ref 8.4–10.5)
Chloride: 103 mEq/L (ref 96–112)
Creatinine, Ser: 1.16 mg/dL (ref 0.40–1.50)
GFR: 66.06 mL/min (ref >60.00)
Glucose, Bld: 104 mg/dL — ABNORMAL HIGH (ref 70–99)
Potassium: 4.2 mEq/L (ref 3.5–5.1)
Sodium: 139 mEq/L (ref 135–145)

## 2022-10-29 LAB — TSH: TSH: 1.59 u[IU]/mL (ref 0.35–5.50)

## 2022-10-29 LAB — VITAMIN D 25 HYDROXY (VIT D DEFICIENCY, FRACTURES): VITD: 39.03 ng/mL (ref 30.00–100.00)

## 2022-10-29 LAB — HEMOGLOBIN A1C: Hgb A1c MFr Bld: 5.8 % (ref 4.6–6.5)

## 2022-10-29 LAB — VITAMIN B12: Vitamin B-12: 343 pg/mL (ref 211–911)

## 2022-10-29 MED ORDER — REPATHA SURECLICK 140 MG/ML ~~LOC~~ SOAJ: 140.0000 mg | SUBCUTANEOUS | 3 refills | Status: DC

## 2022-10-29 NOTE — Progress Notes (Signed)
Patient ID: Richard Roach, male   DOB: December 05, 1955, 67 y.o.   MRN: [id number]         Chief Complaint:: wellness exam and htn, hld, hyperglycemia, low vit d       HPI:  Richard Roach is a 67 y.o. male here for wellness exam; due for colonscopy but declines for now, due for prevnar 20 today, o/w up to date                Also had PSA testing with [facility] Dr Alvester Morin yesterday, results not yet known.  Pt denies chest pain, increased sob or doe, wheezing, orthopnea, PND, increased LE swelling, palpitations, dizziness or syncope.   Pt denies polydipsia, polyuria, or new focal neuro s/s.    Pt denies fever, wt loss, night sweats, loss of appetite, or other constitutional symptoms     Wt Readings from Last 3 Encounters:  10/30/22 190 lb (86.2 kg)  10/29/22 189 lb (85.7 kg)  10/24/21 184 lb 3.2 oz (83.6 kg)   BP Readings from Last 3 Encounters:  10/30/22 122/70  10/29/22 118/72  10/30/21 (!) 156/89   Immunization History  Administered Date(s) Administered   DTP 04/10/1998   Hep A / Hep B 11/01/2014, 04/10/2015   Hepatitis B, ADULT 11/22/2014   Hepatitis B, PED/ADOLESCENT 11/22/2014   Influenza Split 03/11/2013   Influenza Whole 05/11/2009, 03/11/2010, 01/10/2011, 03/11/2012   Influenza,inj,Quad PF,6+ Mos 01/19/2014, 04/07/2017, 02/20/2018, 01/24/2019, 02/22/2020   Influenza-Unspecified 02/09/2015   PFIZER(Purple Top)SARS-COV-2 Vaccination 06/01/2019, 06/22/2019, 03/26/2020   PNEUMOCOCCAL CONJUGATE-20 10/29/2022   Pneumococcal Polysaccharide-23 05/12/2003   Td 09/23/2007   Tdap 05/06/2013, 07/11/2020   Zoster Recombinat (Shingrix) 12/11/2019, 02/09/2020, 09/22/2021   Health Maintenance Due  Topic Date Due   Medicare Annual Wellness (AWV)  Never done   Colonoscopy  09/14/2022      Past Medical History:  Diagnosis Date   Allergic rhinitis    Anxiety state 05/02/2015   Chronic RLQ pain 05/02/2015   Colon polyps    Coronary artery disease    Fatty liver    GERD (gastroesophageal  reflux disease)    Hypercholesterolemia    Hypertension    Inguinal hernia    Internal hemorrhoids    Liver fibrosis 05/02/2015   PVC's (premature ventricular contractions)    Serrated adenoma of colon    Sinus bradycardia    Past Surgical History:  Procedure Laterality Date   KNEE ARTHROSCOPY Right June 2011   KNEE ARTHROSCOPY Left 2012   TONSILLECTOMY      reports that he quit smoking about 24 years ago. His smoking use included cigarettes. He has never used smokeless tobacco. He reports current alcohol use of about 14.0 standard drinks of alcohol per week. He reports that he does not use drugs. family history includes Asthma in his father; High blood pressure in his mother. No Known Allergies Current Outpatient Medications on File Prior to Visit  Medication Sig Dispense Refill   Ascorbic Acid (VITAMIN C) 1000 MG tablet Take 1,000 mg by mouth daily.     aspirin EC 81 MG tablet Take 1 tablet (81 mg total) by mouth daily. 90 tablet 3   Multiple Vitamin (MULTIVITAMIN) capsule Take 1 capsule by mouth daily.     rosuvastatin (CRESTOR) 40 MG tablet Take 1 tablet (40 mg total) by mouth daily. Must keep 10/29/22 appt for future refills 90 tablet 0   sildenafil (VIAGRA) 100 MG tablet 1 tab by mouth one daily as needed 5 tablet 11  No current facility-administered medications on file prior to visit.        ROS:  All others reviewed and negative.  Objective        PE:  BP 118/72 (BP Location: Right Arm, Patient Position: Sitting, Cuff Size: Normal)   Pulse 60   Temp 97.8 F (36.6 C) (Oral)   Ht 5\' 10"  (1.778 m)   Wt 189 lb (85.7 kg)   SpO2 97%   BMI 27.12 kg/m                 Constitutional: Pt appears in NAD               HENT: Head: NCAT.                Right Ear: External ear normal.                 Left Ear: External ear normal.                Eyes: . Pupils are equal, round, and reactive to light. Conjunctivae and EOM are normal               Nose: without d/c or  deformity               Neck: Neck supple. Gross normal ROM               Cardiovascular: Normal rate and regular rhythm.                 Pulmonary/Chest: Effort normal and breath sounds without rales or wheezing.                Abd:  Soft, NT, ND, + BS, no organomegaly               Neurological: Pt is alert. At baseline orientation, motor grossly intact               Skin: Skin is warm. No rashes, no other new lesions, LE edema - none               Psychiatric: Pt behavior is normal without agitation   Micro: none  Cardiac tracings I have personally interpreted today:  none  Pertinent Radiological findings (summarize): none   Lab Results  Component Value Date   WBC 5.9 10/29/2022   HGB 16.1 10/29/2022   HCT 48.5 10/29/2022   PLT 215.0 10/29/2022   GLUCOSE 104 (H) 10/29/2022   CHOL 156 10/29/2022   TRIG 90.0 10/29/2022   HDL 59.00 10/29/2022   LDLDIRECT 164.0 04/18/2012   LDLCALC 79 10/29/2022   ALT 23 10/29/2022   AST 20 10/29/2022   NA 139 10/29/2022   K 4.2 10/29/2022   CL 103 10/29/2022   CREATININE 1.16 10/29/2022   BUN 23 10/29/2022   CO2 26 10/29/2022   TSH 1.59 10/29/2022   PSA 1.91 09/22/2021   HGBA1C 5.8 10/29/2022   Assessment/Plan:  Richard Roach is a 67 y.o. White or Caucasian [1] male with  has a past medical history of Allergic rhinitis, Anxiety state (05/02/2015), Chronic RLQ pain (05/02/2015), Colon polyps, Coronary artery disease, Fatty liver, GERD (gastroesophageal reflux disease), Hypercholesterolemia, Hypertension, Inguinal hernia, Internal hemorrhoids, Liver fibrosis (05/02/2015), PVC's (premature ventricular contractions), Serrated adenoma of colon, and Sinus bradycardia.  Encounter for well adult exam with abnormal findings Age and sex appropriate education and counseling updated with regular exercise and diet Referrals for preventative services - declines colonscopy Immunizations  addressed - for prevnar 20 today Smoking counseling  - none  needed Evidence for depression or other mood disorder - none significant Most recent labs reviewed. I have personally reviewed and have noted: 1) the patient's medical and social history 2) The patient's current medications and supplements 3) The patient's height, weight, and BMI have been recorded in the chart   Essential hypertension BP Readings from Last 3 Encounters:  10/30/22 122/70  10/29/22 118/72  10/30/21 (!) 156/89   Stable, pt to continue medical treatment toprol xl 12.5 qd   Hypercholesterolemia Lab Results  Component Value Date   LDLCALC 79 10/29/2022   uncontrolled, pt to continue current statin crestor 40 mg every day, but also change zetia to repatha 140 mg  q 2 wks   Hyperglycemia Lab Results  Component Value Date   HGBA1C 5.8 10/29/2022   Stable, pt to continue current medical treatment  - diet, wt control   Vitamin D deficiency Last vitamin D Lab Results  Component Value Date   VD25OH 39.03 10/29/2022   Low, to start oral replacement  Followup: Return in about 1 year (around 10/29/2023).  Oliver Barre, MD 11/01/2022 7:24 AM [facility]

## 2022-10-29 NOTE — Progress Notes (Signed)
Cardiology Office Note:   Date:  10/30/2022  NAME:  Richard Roach    MRN: [id number] DOB:  Jul 28, 1955   PCP:  Corwin Levins, MD  Cardiologist:  Reatha Harps, MD  Electrophysiologist:  None   Referring MD: Corwin Levins, MD   Chief Complaint  Patient presents with   Follow-up         History of Present Illness:   Richard Roach is a 67 y.o. male with a hx of CAD who presents for follow-up.  He reports he is doing well.  Denies any chest pain or trouble breathing.  He is exercising 20 to 30 minutes every other day.  Uses the elliptical.  No chest pains or trouble breathing reported.  He request information on proper diet.  Can get a bit lightheaded when he leans forward or bends down.  Not drinking enough water.  We discussed increasing his water intake.  CV exam unremarkable.  No symptoms of angina.  Cardiac PET test was very normal.   Problem List 1. CAD -CAC score 846 98th percentile -normal PET MPI 10/14/2021 2. PVCs/palpitations (Holter 2016, 0.05% PVC burden) 3. Fatty liver  4. Hyperlipidemia  -T chol 156, HDL 59, LDL 79, TG 90 -A1c 5.8  Past Medical History: Past Medical History:  Diagnosis Date   Allergic rhinitis    Anxiety state 05/02/2015   Chronic RLQ pain 05/02/2015   Colon polyps    Coronary artery disease    Fatty liver    GERD (gastroesophageal reflux disease)    Hypercholesterolemia    Hypertension    Inguinal hernia    Internal hemorrhoids    Liver fibrosis 05/02/2015   PVC's (premature ventricular contractions)    Serrated adenoma of colon    Sinus bradycardia     Past Surgical History: Past Surgical History:  Procedure Laterality Date   KNEE ARTHROSCOPY Right June 2011   KNEE ARTHROSCOPY Left 2012   TONSILLECTOMY      Current Medications: Current Meds  Medication Sig   Ascorbic Acid (VITAMIN C) 1000 MG tablet Take 1,000 mg by mouth daily.   aspirin EC 81 MG tablet Take 1 tablet (81 mg total) by mouth daily.   ezetimibe (ZETIA) 10 MG tablet  Take 10 mg by mouth daily.   Multiple Vitamin (MULTIVITAMIN) capsule Take 1 capsule by mouth daily.   rosuvastatin (CRESTOR) 40 MG tablet Take 1 tablet (40 mg total) by mouth daily. Must keep 10/29/22 appt for future refills   sildenafil (VIAGRA) 100 MG tablet 1 tab by mouth one daily as needed   [DISCONTINUED] metoprolol succinate (TOPROL-XL) 25 MG 24 hr tablet Take 1 tablet (25 mg total) by mouth daily.     Allergies:    Patient has no known allergies.   Social History: Social History   Socioeconomic History   Marital status: Married    Spouse name: Not on file   Number of children: 2   Years of education: Not on file   Highest education level: Not on file  Occupational History   Occupation: Hydrographic surveyor  Tobacco Use   Smoking status: Former    Types: Cigarettes    Quit date: 05/11/1998    Years since quitting: 24.4   Smokeless tobacco: Never   Tobacco comments:    smoked only socially x 5 yrs  Substance and Sexual Activity   Alcohol use: Yes    Alcohol/week: 14.0 standard drinks of alcohol    Types: 14 Glasses of wine  per week   Drug use: No   Sexual activity: Yes  Other Topics Concern   Not on file  Social History Narrative   He lives with his wife, he is a Transport planner, travel, desk job, no smoking.   Social Determinants of Health   Financial Resource Strain: Not on file  Food Insecurity: Not on file  Transportation Needs: Not on file  Physical Activity: Not on file  Stress: Not on file  Social Connections: Not on file     Family History: The patient's family history includes Asthma in his father; High blood pressure in his mother. There is no history of Colon cancer, Colon polyps, Diabetes, Kidney disease, Esophageal cancer, Liver cancer, Rectal cancer, or Stomach cancer.  ROS:   All other ROS reviewed and negative. Pertinent positives noted in the HPI.     EKGs/Labs/Other Studies Reviewed:   The following studies were personally reviewed by me  today:  EKG:  EKG is not ordered today.        Recent Labs: 10/29/2022: ALT 23; BUN 23; Creatinine, Ser 1.16; Hemoglobin 16.1; Platelets 215.0; Potassium 4.2; Sodium 139; TSH 1.59   Recent Lipid Panel    Component Value Date/Time   CHOL 156 10/29/2022 0906   CHOL 141 12/20/2020 0811   TRIG 90.0 10/29/2022 0906   HDL 59.00 10/29/2022 0906   HDL 56 12/20/2020 0811   CHOLHDL 3 10/29/2022 0906   VLDL 18.0 10/29/2022 0906   LDLCALC 79 10/29/2022 0906   LDLCALC 72 12/20/2020 0811   LDLDIRECT 164.0 04/18/2012 0930    Physical Exam:   VS:  BP 122/70   Pulse 61   Ht 5\' 10"  (1.778 m)   Wt 190 lb (86.2 kg)   SpO2 98%   BMI 27.26 kg/m    Wt Readings from Last 3 Encounters:  10/30/22 190 lb (86.2 kg)  10/29/22 189 lb (85.7 kg)  10/24/21 184 lb 3.2 oz (83.6 kg)    General: Well nourished, well developed, in no acute distress Head: Atraumatic, normal size  Eyes: PEERLA, EOMI  Neck: Supple, no JVD Endocrine: No thryomegaly Cardiac: Normal S1, S2; RRR; no murmurs, rubs, or gallops Lungs: Clear to auscultation bilaterally, no wheezing, rhonchi or rales  Abd: Soft, nontender, no hepatomegaly  Ext: No edema, pulses 2+ Musculoskeletal: No deformities, BUE and BLE strength normal and equal Skin: Warm and dry, no rashes   Neuro: Alert and oriented to person, place, time, and situation, CNII-XII grossly intact, no focal deficits  Psych: Normal mood and affect   ASSESSMENT:   Richard Roach is a 67 y.o. male who presents for the following: 1. Coronary artery disease involving native coronary artery of native heart without angina pectoris   2. Hyperlipidemia LDL goal <70   3. PVC (premature ventricular contraction)     PLAN:   1. Coronary artery disease involving native coronary artery of native heart without angina pectoris 2. Hyperlipidemia LDL goal <70 -Elevated coronary calcium score.  Negative PET MPI.  On aspirin 81 mg daily.  Currently on Crestor 40 mg daily and Zetia 10 mg daily.   LDL not at goal.  We discussed Repatha versus Leqvio.  We will see what his cost would be.  He is interested in the every 51-month injection.  We will have pharmacy assist with this.  Otherwise without symptoms of angina.  Overall doing well.  We gave him information on proper diet.  He needs to drink more water.  3. PVC (premature ventricular  contraction) -Dizziness with metoprolol.  Reduce metoprolol succinate to 12.5 mg daily.  If continues to have dizziness despite adequate hydration would recommend to stop metoprolol.      Disposition: Return in about 1 year (around 10/30/2023).  Medication Adjustments/Labs and Tests Ordered: Current medicines are reviewed at length with the patient today.  Concerns regarding medicines are outlined above.  No orders of the defined types were placed in this encounter.  Meds ordered this encounter  Medications   metoprolol succinate (TOPROL-XL) 25 MG 24 hr tablet    Sig: Take 0.5 tablets (12.5 mg total) by mouth daily.    Dispense:  45 tablet    Refill:  3    Dose change new Rx   Patient Instructions  Medication Instructions:  Decrease Metoprolol to 12.5 mg daily   *If you need a refill on your cardiac medications before your next appointment, please call your pharmacy*   Follow-Up: At [facility], you and your health needs are our priority.  As part of our continuing mission to provide you with exceptional heart care, we have created designated Provider Care Teams.  These Care Teams include your primary Cardiologist (physician) and Advanced Practice Providers (APPs -  Physician Assistants and Nurse Practitioners) who all work together to provide you with the care you need, when you need it.  We recommend signing up for the patient portal called "MyChart".  Sign up information is provided on this After Visit Summary.  MyChart is used to connect with patients for Virtual Visits (Telemedicine).  Patients are able to view lab/test results,  encounter notes, upcoming appointments, etc.  Non-urgent messages can be sent to your provider as well.   To learn more about what you can do with MyChart, go to [email or web address].    Your next appointment:   12 month(s)  Provider:   Reatha Harps, MD        Mediterranean Diet A Mediterranean diet refers to food and lifestyle choices that are based on the traditions of countries located on the [place] Sea. It focuses on eating more fruits, vegetables, whole grains, beans, nuts, seeds, and heart-healthy fats, and eating less dairy, meat, eggs, and processed foods with added sugar, salt, and fat. This way of eating has been shown to help prevent certain conditions and improve outcomes for people who have chronic diseases, like kidney disease and heart disease. What are tips for following this plan? Reading food labels Check the serving size of packaged foods. For foods such as rice and pasta, the serving size refers to the amount of cooked product, not dry. Check the total fat in packaged foods. Avoid foods that have saturated fat or trans fats. Check the ingredient list for added sugars, such as corn syrup. Shopping  Buy a variety of foods that offer a balanced diet, including: Fresh fruits and vegetables (produce). Grains, beans, nuts, and seeds. Some of these may be available in unpackaged forms or large amounts (in bulk). Fresh seafood. Poultry and eggs. Low-fat dairy products. Buy whole ingredients instead of prepackaged foods. Buy fresh fruits and vegetables in-season from local farmers markets. Buy plain frozen fruits and vegetables. If you do not have access to quality fresh seafood, buy precooked frozen shrimp or canned fish, such as tuna, salmon, or sardines. Stock your pantry so you always have certain foods on hand, such as olive oil, canned tuna, canned tomatoes, rice, pasta, and beans. Cooking Cook foods with extra-virgin olive oil instead of using butter or  other vegetable oils. Have meat as a side dish, and have vegetables or grains as your main dish. This means having meat in small portions or adding small amounts of meat to foods like pasta or stew. Use beans or vegetables instead of meat in common dishes like chili or lasagna. Experiment with different cooking methods. Try roasting, broiling, steaming, and sauting vegetables. Add frozen vegetables to soups, stews, pasta, or rice. Add nuts or seeds for added healthy fats and plant protein at each meal. You can add these to yogurt, salads, or vegetable dishes. Marinate fish or vegetables using olive oil, lemon juice, garlic, and fresh herbs. Meal planning Plan to eat one vegetarian meal one day each week. Try to work up to two vegetarian meals, if possible. Eat seafood two or more times a week. Have healthy snacks readily available, such as: Vegetable sticks with hummus. Greek yogurt. Fruit and nut trail mix. Eat balanced meals throughout the week. This includes: Fruit: 2-3 servings a day. Vegetables: 4-5 servings a day. Low-fat dairy: 2 servings a day. Fish, poultry, or lean meat: 1 serving a day. Beans and legumes: 2 or more servings a week. Nuts and seeds: 1-2 servings a day. Whole grains: 6-8 servings a day. Extra-virgin olive oil: 3-4 servings a day. Limit red meat and sweets to only a few servings a month. Lifestyle  Cook and eat meals together with your family, when possible. Drink enough fluid to keep your urine pale yellow. Be physically active every day. This includes: Aerobic exercise like running or swimming. Leisure activities like gardening, walking, or housework. Get 7-8 hours of sleep each night. If recommended by your health care provider, drink red wine in moderation. This means 1 glass a day for nonpregnant women and 2 glasses a day for men. A glass of wine equals 5 oz (150 mL). What foods should I eat? Fruits Apples. Apricots. Avocado. Berries. Bananas.  Cherries. Dates. Figs. Grapes. Lemons. Melon. Oranges. Peaches. Plums. Pomegranate. Vegetables Artichokes. Beets. Broccoli. Cabbage. Carrots. Eggplant. Green beans. Chard. Kale. Spinach. Onions. Leeks. Peas. Squash. Tomatoes. Peppers. Radishes. Grains Whole-grain pasta. Brown rice. Bulgur wheat. Polenta. Couscous. Whole-wheat bread. Orpah Cobb. Meats and other proteins Beans. Almonds. Sunflower seeds. Pine nuts. Peanuts. Cod. Salmon. Scallops. Shrimp. Tuna. Tilapia. Clams. Oysters. Eggs. Poultry without skin. Dairy Low-fat milk. Cheese. Greek yogurt. Fats and oils Extra-virgin olive oil. Avocado oil. Grapeseed oil. Beverages Water. Red wine. Herbal tea. Sweets and desserts Greek yogurt with honey. Baked apples. Poached pears. Trail mix. Seasonings and condiments Basil. Cilantro. Coriander. Cumin. Mint. Parsley. Sage. Rosemary. Tarragon. Garlic. Oregano. Thyme. Pepper. Balsamic vinegar. Tahini. Hummus. Tomato sauce. Olives. Mushrooms. The items listed above may not be a complete list of foods and beverages you can eat. Contact a dietitian for more information. What foods should I limit? This is a list of foods that should be eaten rarely or only on special occasions. Fruits Fruit canned in syrup. Vegetables Deep-fried potatoes (french fries). Grains Prepackaged pasta or rice dishes. Prepackaged cereal with added sugar. Prepackaged snacks with added sugar. Meats and other proteins Beef. Pork. Lamb. Poultry with skin. Hot dogs. Tomasa Blase. Dairy Ice cream. Sour cream. Whole milk. Fats and oils Butter. Canola oil. Vegetable oil. Beef fat (tallow). Lard. Beverages Juice. Sugar-sweetened soft drinks. Beer. Liquor and spirits. Sweets and desserts Cookies. Cakes. Pies. Candy. Seasonings and condiments Mayonnaise. Pre-made sauces and marinades. The items listed above may not be a complete list of foods and beverages you should limit. Contact a dietitian for more  information. Summary The Mediterranean diet includes both food and lifestyle choices. Eat a variety of fresh fruits and vegetables, beans, nuts, seeds, and whole grains. Limit the amount of red meat and sweets that you eat. If recommended by your health care provider, drink red wine in moderation. This means 1 glass a day for nonpregnant women and 2 glasses a day for men. A glass of wine equals 5 oz (150 mL). This information is not intended to replace advice given to you by your health care provider. Make sure you discuss any questions you have with your health care provider. Document Revised: 06/02/2019 Document Reviewed: 03/30/2019 Elsevier Patient Education  2023 Elsevier Inc.    Time Spent with Patient: I have spent a total of 25 minutes with patient reviewing hospital notes, telemetry, EKGs, labs and examining the patient as well as establishing an assessment and plan that was discussed with the patient.  > 50% of time was spent in direct patient care.  Signed, Lenna Gilford. Flora Lipps, MD, [facility]  [facility]  [address], [address] [place], [place] [address] [phone]  10/30/2022 8:58 AM

## 2022-10-29 NOTE — Patient Instructions (Signed)
You had the Prevnar 20 pneumonia shot today  Please continue all other medications as before, and refills have been done if requested.  Please have the pharmacy call with any other refills you may need.  Please continue your efforts at being more active, low cholesterol diet, and weight control.  You are otherwise up to date with prevention measures today.  Please keep your appointments with your specialists as you may have planned  Please go to the LAB at the blood drawing area for the tests to be done  You will be contacted by phone if any changes need to be made immediately.  Otherwise, you will receive a letter about your results with an explanation, but please check with MyChart first.  Please remember to sign up for MyChart if you have not done so, as this will be important to you in the future with finding out test results, communicating by private email, and scheduling acute appointments online when needed.  Please make an Appointment to return for your 1 year visit, or sooner if needed 

## 2022-10-30 ENCOUNTER — Ambulatory Visit: Payer: Medicare HMO | Attending: Cardiovascular Disease | Admitting: Cardiovascular Disease

## 2022-10-30 ENCOUNTER — Encounter: Payer: Self-pay | Admitting: Cardiovascular Disease

## 2022-10-30 VITALS — BP 122/70 | HR 61 | Ht 70.0 in | Wt 190.0 lb

## 2022-10-30 DIAGNOSIS — I493 Ventricular premature depolarization: Secondary | ICD-10-CM | POA: Diagnosis not present

## 2022-10-30 DIAGNOSIS — I2510 Atherosclerotic heart disease of native coronary artery without angina pectoris: Secondary | ICD-10-CM | POA: Diagnosis not present

## 2022-10-30 DIAGNOSIS — E785 Hyperlipidemia, unspecified: Secondary | ICD-10-CM | POA: Diagnosis not present

## 2022-10-30 MED ORDER — METOPROLOL SUCCINATE ER 25 MG PO TB24: 12.5000 mg | ORAL_TABLET | Freq: Every day | ORAL | 3 refills | Status: DC

## 2022-10-30 NOTE — Patient Instructions (Addendum)
Medication Instructions:  Decrease Metoprolol to 12.5 mg daily   *If you need a refill on your cardiac medications before your next appointment, please call your pharmacy*   Follow-Up: At [facility], you and your health needs are our priority.  As part of our continuing mission to provide you with exceptional heart care, we have created designated Provider Care Teams.  These Care Teams include your primary Cardiologist (physician) and Advanced Practice Providers (APPs -  Physician Assistants and Nurse Practitioners) who all work together to provide you with the care you need, when you need it.  We recommend signing up for the patient portal called "MyChart".  Sign up information is provided on this After Visit Summary.  MyChart is used to connect with patients for Virtual Visits (Telemedicine).  Patients are able to view lab/test results, encounter notes, upcoming appointments, etc.  Non-urgent messages can be sent to your provider as well.   To learn more about what you can do with MyChart, go to [email or web address].    Your next appointment:   12 month(s)  Provider:   Reatha Harps, MD        Mediterranean Diet A Mediterranean diet refers to food and lifestyle choices that are based on the traditions of countries located on the [place]. It focuses on eating more fruits, vegetables, whole grains, beans, nuts, seeds, and heart-healthy fats, and eating less dairy, meat, eggs, and processed foods with added sugar, salt, and fat. This way of eating has been shown to help prevent certain conditions and improve outcomes for people who have chronic diseases, like kidney disease and heart disease. What are tips for following this plan? Reading food labels Check the serving size of packaged foods. For foods such as rice and pasta, the serving size refers to the amount of cooked product, not dry. Check the total fat in packaged foods. Avoid foods that have saturated fat or  trans fats. Check the ingredient list for added sugars, such as corn syrup. Shopping  Buy a variety of foods that offer a balanced diet, including: Fresh fruits and vegetables (produce). Grains, beans, nuts, and seeds. Some of these may be available in unpackaged forms or large amounts (in bulk). Fresh seafood. Poultry and eggs. Low-fat dairy products. Buy whole ingredients instead of prepackaged foods. Buy fresh fruits and vegetables in-season from local farmers markets. Buy plain frozen fruits and vegetables. If you do not have access to quality fresh seafood, buy precooked frozen shrimp or canned fish, such as tuna, salmon, or sardines. Stock your pantry so you always have certain foods on hand, such as olive oil, canned tuna, canned tomatoes, rice, pasta, and beans. Cooking Cook foods with extra-virgin olive oil instead of using butter or other vegetable oils. Have meat as a side dish, and have vegetables or grains as your main dish. This means having meat in small portions or adding small amounts of meat to foods like pasta or stew. Use beans or vegetables instead of meat in common dishes like chili or lasagna. Experiment with different cooking methods. Try roasting, broiling, steaming, and sauting vegetables. Add frozen vegetables to soups, stews, pasta, or rice. Add nuts or seeds for added healthy fats and plant protein at each meal. You can add these to yogurt, salads, or vegetable dishes. Marinate fish or vegetables using olive oil, lemon juice, garlic, and fresh herbs. Meal planning Plan to eat one vegetarian meal one day each week. Try to work up to two vegetarian meals, if  possible. Eat seafood two or more times a week. Have healthy snacks readily available, such as: Vegetable sticks with hummus. Greek yogurt. Fruit and nut trail mix. Eat balanced meals throughout the week. This includes: Fruit: 2-3 servings a day. Vegetables: 4-5 servings a day. Low-fat dairy: 2  servings a day. Fish, poultry, or lean meat: 1 serving a day. Beans and legumes: 2 or more servings a week. Nuts and seeds: 1-2 servings a day. Whole grains: 6-8 servings a day. Extra-virgin olive oil: 3-4 servings a day. Limit red meat and sweets to only a few servings a month. Lifestyle  Cook and eat meals together with your family, when possible. Drink enough fluid to keep your urine pale yellow. Be physically active every day. This includes: Aerobic exercise like running or swimming. Leisure activities like gardening, walking, or housework. Get 7-8 hours of sleep each night. If recommended by your health care provider, drink red wine in moderation. This means 1 glass a day for nonpregnant women and 2 glasses a day for men. A glass of wine equals 5 oz (150 mL). What foods should I eat? Fruits Apples. Apricots. Avocado. Berries. Bananas. Cherries. Dates. Figs. Grapes. Lemons. Melon. Oranges. Peaches. Plums. Pomegranate. Vegetables Artichokes. Beets. Broccoli. Cabbage. Carrots. Eggplant. Green beans. Chard. Kale. Spinach. Onions. Leeks. Peas. Squash. Tomatoes. Peppers. Radishes. Grains Whole-grain pasta. Brown rice. Bulgur wheat. Polenta. Couscous. Whole-wheat bread. Orpah Cobb. Meats and other proteins Beans. Almonds. Sunflower seeds. Pine nuts. Peanuts. Cod. Salmon. Scallops. Shrimp. Tuna. Tilapia. Clams. Oysters. Eggs. Poultry without skin. Dairy Low-fat milk. Cheese. Greek yogurt. Fats and oils Extra-virgin olive oil. Avocado oil. Grapeseed oil. Beverages Water. Red wine. Herbal tea. Sweets and desserts Greek yogurt with honey. Baked apples. Poached pears. Trail mix. Seasonings and condiments Basil. Cilantro. Coriander. Cumin. Mint. Parsley. Sage. Rosemary. Tarragon. Garlic. Oregano. Thyme. Pepper. Balsamic vinegar. Tahini. Hummus. Tomato sauce. Olives. Mushrooms. The items listed above may not be a complete list of foods and beverages you can eat. Contact a dietitian for  more information. What foods should I limit? This is a list of foods that should be eaten rarely or only on special occasions. Fruits Fruit canned in syrup. Vegetables Deep-fried potatoes (french fries). Grains Prepackaged pasta or rice dishes. Prepackaged cereal with added sugar. Prepackaged snacks with added sugar. Meats and other proteins Beef. Pork. Lamb. Poultry with skin. Hot dogs. Tomasa Blase. Dairy Ice cream. Sour cream. Whole milk. Fats and oils Butter. Canola oil. Vegetable oil. Beef fat (tallow). Lard. Beverages Juice. Sugar-sweetened soft drinks. Beer. Liquor and spirits. Sweets and desserts Cookies. Cakes. Pies. Candy. Seasonings and condiments Mayonnaise. Pre-made sauces and marinades. The items listed above may not be a complete list of foods and beverages you should limit. Contact a dietitian for more information. Summary The Mediterranean diet includes both food and lifestyle choices. Eat a variety of fresh fruits and vegetables, beans, nuts, seeds, and whole grains. Limit the amount of red meat and sweets that you eat. If recommended by your health care provider, drink red wine in moderation. This means 1 glass a day for nonpregnant women and 2 glasses a day for men. A glass of wine equals 5 oz (150 mL). This information is not intended to replace advice given to you by your health care provider. Make sure you discuss any questions you have with your health care provider. Document Revised: 06/02/2019 Document Reviewed: 03/30/2019 Elsevier Patient Education  2023 [employer].

## 2022-11-01 ENCOUNTER — Encounter: Payer: Self-pay | Admitting: Internal Medicine

## 2022-11-01 NOTE — Assessment & Plan Note (Signed)
Lab Results  Component Value Date   LDLCALC 79 10/29/2022   uncontrolled, pt to continue current statin crestor 40 mg every day, but also change zetia to repatha 140 mg  q 2 wks

## 2022-11-01 NOTE — Assessment & Plan Note (Signed)
Last vitamin D Lab Results  Component Value Date   VD25OH 39.03 10/29/2022   Low, to start oral replacement

## 2022-11-01 NOTE — Assessment & Plan Note (Signed)
BP Readings from Last 3 Encounters:  10/30/22 122/70  10/29/22 118/72  10/30/21 (!) 156/89   Stable, pt to continue medical treatment toprol xl 12.5 qd

## 2022-11-01 NOTE — Addendum Note (Signed)
Addended by: Corwin Levins on: 11/01/2022 07:25 AM   Modules accepted: Level of Service

## 2022-11-01 NOTE — Assessment & Plan Note (Signed)
Age and sex appropriate education and counseling updated with regular exercise and diet Referrals for preventative services - declines colonscopy Immunizations addressed - for prevnar 20 today Smoking counseling  - none needed Evidence for depression or other mood disorder - none significant Most recent labs reviewed. I have personally reviewed and have noted: 1) the patient's medical and social history 2) The patient's current medications and supplements 3) The patient's height, weight, and BMI have been recorded in the chart

## 2022-11-01 NOTE — Assessment & Plan Note (Signed)
Lab Results  Component Value Date   HGBA1C 5.8 10/29/2022   Stable, pt to continue current medical treatment  - diet, wt control  

## 2022-11-09 ENCOUNTER — Telehealth: Payer: Self-pay | Admitting: Cardiovascular Disease

## 2022-11-09 NOTE — Telephone Encounter (Signed)
Patient returned call. Will come by office to sign Leqvio enrollment form.

## 2022-11-09 NOTE — Telephone Encounter (Signed)
Patient came in today to inquire about if there was any update on the Repatha vs Leqvio shot and what his cost will be?  Stated he had an appt on 6/21 where this discussed and was told he would receive a call with how much it would cost/if it was covered.  Please give patient a call back to discuss.

## 2022-11-09 NOTE — Telephone Encounter (Signed)
Attempted to call patient. No answer, mailbox full. Can not leave message.

## 2022-11-11 ENCOUNTER — Encounter: Payer: Self-pay | Admitting: Cardiovascular Disease

## 2022-11-11 ENCOUNTER — Encounter: Payer: Self-pay | Admitting: Internal Medicine

## 2022-11-11 LAB — LAB REPORT - SCANNED: A1c: 5.8

## 2022-11-19 ENCOUNTER — Encounter: Payer: Self-pay | Admitting: Internal Medicine

## 2022-11-19 ENCOUNTER — Ambulatory Visit (INDEPENDENT_AMBULATORY_CARE_PROVIDER_SITE_OTHER): Payer: Medicare HMO | Admitting: Internal Medicine

## 2022-11-19 VITALS — BP 120/82 | HR 56 | Temp 98.0°F | Ht 70.0 in | Wt 187.0 lb

## 2022-11-19 DIAGNOSIS — R5383 Other fatigue: Secondary | ICD-10-CM | POA: Diagnosis not present

## 2022-11-19 DIAGNOSIS — R3912 Poor urinary stream: Secondary | ICD-10-CM

## 2022-11-19 DIAGNOSIS — R739 Hyperglycemia, unspecified: Secondary | ICD-10-CM

## 2022-11-19 DIAGNOSIS — N401 Enlarged prostate with lower urinary tract symptoms: Secondary | ICD-10-CM

## 2022-11-19 DIAGNOSIS — N529 Male erectile dysfunction, unspecified: Secondary | ICD-10-CM | POA: Diagnosis not present

## 2022-11-19 DIAGNOSIS — I10 Essential (primary) hypertension: Secondary | ICD-10-CM

## 2022-11-19 LAB — T4, FREE: Free T4: 0.64 ng/dL (ref 0.60–1.60)

## 2022-11-19 LAB — FERRITIN: Ferritin: 147.8 ng/mL (ref 22.0–322.0)

## 2022-11-19 LAB — TESTOSTERONE: Testosterone: 328.42 ng/dL (ref 300.00–890.00)

## 2022-11-19 LAB — CORTISOL: Cortisol, Plasma: 7.5 ug/dL

## 2022-11-19 LAB — IBC PANEL
Iron: 74 ug/dL (ref 42–165)
Saturation Ratios: 22.8 % (ref 20.0–50.0)
TIBC: 324.8 ug/dL (ref 250.0–450.0)
Transferrin: 232 mg/dL (ref 212.0–360.0)

## 2022-11-19 MED ORDER — TADALAFIL 5 MG PO TABS: 5.0000 mg | ORAL_TABLET | Freq: Every day | ORAL | 3 refills | Status: AC

## 2022-11-19 NOTE — Progress Notes (Signed)
The test results show that your current treatment is OK, as the tests are stable.  Please continue the same plan.  There is no other need for change of treatment or further evaluation based on these results, at this time.  thanks 

## 2022-11-19 NOTE — Progress Notes (Signed)
Patient ID: Richard Roach, male   DOB: [birth year], 67 y.o.   MRN: [id number]        Chief Complaint: follow up ED and BPH, low vit d, htn, fatigue, htn, hyperglycemia       HPI:  Richard Roach is a 67 y.o. male here with c/o worsening recent BPH symptoms, was offered flomax but hesitates to take this after reading the side effects.  Also has worsening Ed symptoms x 6 mo, would be ok with cialis 5 mg.  Taking vit d 2000 u every day,  started recently on metoprolol 25 mg 1/2 every day per cardiology and tolerating.  Does have significant fatigue and is requesting multiple labs he has researched on the internet.  Pt denies chest pain, increased sob or doe, wheezing, orthopnea, PND, increased LE swelling, palpitations, dizziness or syncope.   Pt denies polydipsia, polyuria, or new focal neuro s/s.    Pt denies fever, wt loss, night sweats, loss of appetite, or other constitutional symptoms  Does c/o ongoing fatigue, but denies signficant daytime hypersomnolence.        Wt Readings from Last 3 Encounters:  11/19/22 187 lb (84.8 kg)  10/30/22 190 lb (86.2 kg)  10/29/22 189 lb (85.7 kg)   BP Readings from Last 3 Encounters:  11/19/22 120/82  10/30/22 122/70  10/29/22 118/72         Past Medical History:  Diagnosis Date   Allergic rhinitis    Anxiety state 05/02/2015   Chronic RLQ pain 05/02/2015   Colon polyps    Coronary artery disease    Fatty liver    GERD (gastroesophageal reflux disease)    Hypercholesterolemia    Hypertension    Inguinal hernia    Internal hemorrhoids    Liver fibrosis 05/02/2015   PVC's (premature ventricular contractions)    Serrated adenoma of colon    Sinus bradycardia    Past Surgical History:  Procedure Laterality Date   KNEE ARTHROSCOPY Right June 2011   KNEE ARTHROSCOPY Left 2012   TONSILLECTOMY      reports that he quit smoking about 24 years ago. His smoking use included cigarettes. He has never used smokeless tobacco. He reports current alcohol use of about  14.0 standard drinks of alcohol per week. He reports that he does not use drugs. family history includes Asthma in his father; High blood pressure in his mother. No Known Allergies Current Outpatient Medications on File Prior to Visit  Medication Sig Dispense Refill   Ascorbic Acid (VITAMIN C) 1000 MG tablet Take 1,000 mg by mouth daily.     aspirin EC 81 MG tablet Take 1 tablet (81 mg total) by mouth daily. 90 tablet 3   ezetimibe (ZETIA) 10 MG tablet Take 10 mg by mouth daily.     metoprolol succinate (TOPROL-XL) 25 MG 24 hr tablet Take 0.5 tablets (12.5 mg total) by mouth daily. 45 tablet 3   Multiple Vitamin (MULTIVITAMIN) capsule Take 1 capsule by mouth daily.     rosuvastatin (CRESTOR) 40 MG tablet Take 1 tablet (40 mg total) by mouth daily. Must keep 10/29/22 appt for future refills 90 tablet 0   sildenafil (VIAGRA) 100 MG tablet 1 tab by mouth one daily as needed 5 tablet 11   No current facility-administered medications on file prior to visit.        ROS:  All others reviewed and negative.  Objective        PE:  BP 120/82 (BP Location: Left Arm,  Patient Position: Sitting, Cuff Size: Normal)   Pulse (!) 56   Temp 98 F (36.7 C) (Oral)   Ht 5\' 10"  (1.778 m)   Wt 187 lb (84.8 kg)   SpO2 98%   BMI 26.83 kg/m                 Constitutional: Pt appears in NAD               HENT: Head: NCAT.                Right Ear: External ear normal.                 Left Ear: External ear normal.                Eyes: . Pupils are equal, round, and reactive to light. Conjunctivae and EOM are normal               Nose: without d/c or deformity               Neck: Neck supple. Gross normal ROM               Cardiovascular: Normal rate and regular rhythm.                 Pulmonary/Chest: Effort normal and breath sounds without rales or wheezing.                Abd:  Soft, NT, ND, + BS, no organomegaly               Neurological: Pt is alert. At baseline orientation, motor grossly intact                Skin: Skin is warm. No rashes, no other new lesions, LE edema - none               Psychiatric: Pt behavior is normal without agitation , mild nerovus  Micro: none  Cardiac tracings I have personally interpreted today:  none  Pertinent Radiological findings (summarize): none   Lab Results  Component Value Date   WBC 5.9 10/29/2022   HGB 16.1 10/29/2022   HCT 48.5 10/29/2022   PLT 215.0 10/29/2022   GLUCOSE 104 (H) 10/29/2022   CHOL 156 10/29/2022   TRIG 90.0 10/29/2022   HDL 59.00 10/29/2022   LDLDIRECT 164.0 04/18/2012   LDLCALC 79 10/29/2022   ALT 23 10/29/2022   AST 20 10/29/2022   NA 139 10/29/2022   K 4.2 10/29/2022   CL 103 10/29/2022   CREATININE 1.16 10/29/2022   BUN 23 10/29/2022   CO2 26 10/29/2022   TSH 1.59 10/29/2022   PSA 1.91 09/22/2021   HGBA1C 5.8 10/29/2022   Assessment/Plan:  Richard Roach is a 67 y.o. White or Caucasian [1] male with  has a past medical history of Allergic rhinitis, Anxiety state (05/02/2015), Chronic RLQ pain (05/02/2015), Colon polyps, Coronary artery disease, Fatty liver, GERD (gastroesophageal reflux disease), Hypercholesterolemia, Hypertension, Inguinal hernia, Internal hemorrhoids, Liver fibrosis (05/02/2015), PVC's (premature ventricular contractions), Serrated adenoma of colon, and Sinus bradycardia.  Benign prostatic hyperplasia Mild to mod, for cialis 5 mg prn,  to f/u any worsening symptoms or concerns  Erectile dysfunction Also hopefully to improved with cialis 5 mg as well  Essential hypertension BP Readings from Last 3 Encounters:  11/19/22 120/82  10/30/22 122/70  10/29/22 118/72   Stable, pt to continue medical treatment toprolx l 25 1/2 every day, zetia 10  qd   Other fatigue Also for labs including testosterone, cortisol, free t4, iron lab  Hyperglycemia Lab Results  Component Value Date   HGBA1C 5.8 10/29/2022   Stable, pt to continue current medical treatment  - diet, wt control  Followup: Return  if symptoms worsen or fail to improve.  Oliver Barre, MD 11/22/2022 5:21 PM [facility]

## 2022-11-19 NOTE — Patient Instructions (Signed)
Please take all new medication as prescribed - the cialis 5 mg daily  Ok to increase the Vit D3 to 4000 units per day  Please continue all other medications as before, and refills have been done if requested.  Please have the pharmacy call with any other refills you may need.  Please continue your efforts at being more active, low cholesterol diet, and weight control..  Please keep your appointments with your specialists as you may have planned  Please go to the LAB at the blood drawing area for the tests to be done  You will be contacted by phone if any changes need to be made immediately.  Otherwise, you will receive a letter about your results with an explanation, but please check with MyChart first.  Please remember to sign up for MyChart if you have not done so, as this will be important to you in the future with finding out test results, communicating by private email, and scheduling acute appointments online when needed.

## 2022-11-20 DIAGNOSIS — I821 Thrombophlebitis migrans: Secondary | ICD-10-CM | POA: Diagnosis not present

## 2022-11-20 DIAGNOSIS — Z1283 Encounter for screening for malignant neoplasm of skin: Secondary | ICD-10-CM | POA: Diagnosis not present

## 2022-11-22 ENCOUNTER — Encounter: Payer: Self-pay | Admitting: Internal Medicine

## 2022-11-22 DIAGNOSIS — R5383 Other fatigue: Secondary | ICD-10-CM | POA: Insufficient documentation

## 2022-11-22 NOTE — Assessment & Plan Note (Signed)
BP Readings from Last 3 Encounters:  11/19/22 120/82  10/30/22 122/70  10/29/22 118/72   Stable, pt to continue medical treatment toprolx l 25 1/2 every day, zetia 10 qd

## 2022-11-22 NOTE — Assessment & Plan Note (Signed)
Mild to mod, for cialis 5 mg prn,  to f/u any worsening symptoms or concerns

## 2022-11-22 NOTE — Assessment & Plan Note (Signed)
Also hopefully to improved with cialis 5 mg as well

## 2022-11-22 NOTE — Assessment & Plan Note (Signed)
Also for labs including testosterone, cortisol, free t4, iron lab

## 2022-11-22 NOTE — Assessment & Plan Note (Signed)
Lab Results  Component Value Date   HGBA1C 5.8 10/29/2022   Stable, pt to continue current medical treatment  - diet, wt control  

## 2022-11-27 DIAGNOSIS — M65332 Trigger finger, left middle finger: Secondary | ICD-10-CM | POA: Diagnosis not present

## 2022-11-27 NOTE — Telephone Encounter (Signed)
Patient is returning call. Requesting return call.  

## 2022-11-27 NOTE — Telephone Encounter (Signed)
Pt calling back to speak to one of the pharmacists about this encounter

## 2022-11-27 NOTE — Telephone Encounter (Signed)
Called, NA, LVM

## 2022-11-28 ENCOUNTER — Other Ambulatory Visit: Payer: Self-pay | Admitting: Internal Medicine

## 2022-11-28 ENCOUNTER — Encounter: Payer: Self-pay | Admitting: Internal Medicine

## 2022-11-30 ENCOUNTER — Other Ambulatory Visit: Payer: Self-pay

## 2022-11-30 NOTE — Telephone Encounter (Signed)
Re-faxed Leqvio enrollment form to service center

## 2022-11-30 NOTE — Telephone Encounter (Signed)
Noted../lmb 

## 2022-12-04 ENCOUNTER — Telehealth: Payer: Self-pay | Admitting: Pharmacist

## 2022-12-04 NOTE — Telephone Encounter (Signed)
Patient called back. Explained statement of benenefits received from [facility] service center. Patient will not be able to afford. Is interested in pursuing [id number]

## 2022-12-04 NOTE — Telephone Encounter (Signed)
Received statement of benefits from [facility]. Patient has only met 100 dollars towards his 4500 dollar deductible. Contacted patient, lmom to call back

## 2022-12-07 ENCOUNTER — Telehealth: Payer: Self-pay

## 2022-12-07 ENCOUNTER — Other Ambulatory Visit (HOSPITAL_COMMUNITY): Payer: Self-pay

## 2022-12-07 DIAGNOSIS — I2510 Atherosclerotic heart disease of native coronary artery without angina pectoris: Secondary | ICD-10-CM

## 2022-12-07 DIAGNOSIS — E785 Hyperlipidemia, unspecified: Secondary | ICD-10-CM

## 2022-12-07 NOTE — Telephone Encounter (Signed)
Pharmacy Patient Advocate Encounter   Received notification from Physician's Office/PHARMD PAVERO that prior authorization for REPATHA is required/requested.   Insurance verification completed.   The patient is insured through [employer] [facility] .   Per test claim: PA required; PA submitted to [employer] [facility] via CoverMyMeds Key/confirmation #/EOC [id number]           Status is pending

## 2022-12-08 ENCOUNTER — Other Ambulatory Visit (HOSPITAL_COMMUNITY): Payer: Self-pay

## 2022-12-08 MED ORDER — REPATHA SURECLICK 140 MG/ML ~~LOC~~ SOAJ: 1.0000 mL | SUBCUTANEOUS | 5 refills | Status: DC

## 2022-12-08 NOTE — Telephone Encounter (Signed)
Pharmacy Patient Advocate Encounter  Received notification from [employer] MEDICARE that Prior Authorization for REPATHA has been  APPROVED UNTIL 12.31.24  PA #/Case ID/Reference #: [id number]

## 2022-12-08 NOTE — Addendum Note (Signed)
Addended by: Cheree Ditto on: 12/08/2022 05:00 PM   Modules accepted: Orders

## 2022-12-19 ENCOUNTER — Other Ambulatory Visit: Payer: Self-pay | Admitting: Internal Medicine

## 2022-12-21 ENCOUNTER — Other Ambulatory Visit: Payer: Self-pay

## 2022-12-25 DIAGNOSIS — L821 Other seborrheic keratosis: Secondary | ICD-10-CM | POA: Diagnosis not present

## 2022-12-28 ENCOUNTER — Ambulatory Visit (INDEPENDENT_AMBULATORY_CARE_PROVIDER_SITE_OTHER): Payer: Medicare HMO | Admitting: Internal Medicine

## 2022-12-28 VITALS — BP 122/76 | HR 60 | Temp 98.1°F | Ht 70.0 in | Wt 187.0 lb

## 2022-12-28 DIAGNOSIS — I10 Essential (primary) hypertension: Secondary | ICD-10-CM | POA: Diagnosis not present

## 2022-12-28 DIAGNOSIS — E559 Vitamin D deficiency, unspecified: Secondary | ICD-10-CM | POA: Diagnosis not present

## 2022-12-28 DIAGNOSIS — R739 Hyperglycemia, unspecified: Secondary | ICD-10-CM | POA: Diagnosis not present

## 2022-12-28 DIAGNOSIS — Z86010 Personal history of colonic polyps: Secondary | ICD-10-CM | POA: Diagnosis not present

## 2022-12-28 DIAGNOSIS — N401 Enlarged prostate with lower urinary tract symptoms: Secondary | ICD-10-CM

## 2022-12-28 NOTE — Patient Instructions (Signed)
Please continue all other medications as before, and refills have been done if requested.  Please have the pharmacy call with any other refills you may need.  Please continue your efforts at being more active, low cholesterol diet, and weight control.  You are otherwise up to date with prevention measures today. Though you may wish to question the recommendation for the next colonoscopy to be 10 yrs instead of 5 or 7 yrs due to your history of adenomatous polyp  Please keep your appointments with your specialists as you may have planned  Please make an Appointment to return in 6 months, or sooner if needed

## 2022-12-28 NOTE — Progress Notes (Unsigned)
Patient ID: Kaleb Boedeker, male   DOB: December 10, 1955, 67 y.o.   MRN: [id number]        Chief Complaint: follow up low vit d, hx of adenomatous polyp, bph,  htn, hyperglycemia       HPI:  Wynter Meier is a 67 y.o. male here overall doing , has seen urology and asked to start cialis 5 mg but he was not sure about this and has not started, also has hx of adenomatous polyp, and he questions f/u planned for 10 yrs.  Pt denies chest pain, increased sob or doe, wheezing, orthopnea, PND, increased LE swelling, palpitations, dizziness or syncope.   Pt denies polydipsia, polyuria, or new focal neuro s/s.    Pt denies fever, wt loss, night sweats, loss of appetite, or other constitutional symptoms         Wt Readings from Last 3 Encounters:  12/28/22 187 lb (84.8 kg)  11/19/22 187 lb (84.8 kg)  10/30/22 190 lb (86.2 kg)   BP Readings from Last 3 Encounters:  12/28/22 122/76  11/19/22 120/82  10/30/22 122/70         Past Medical History:  Diagnosis Date   Allergic rhinitis    Anxiety state 05/02/2015   Chronic RLQ pain 05/02/2015   Colon polyps    Coronary artery disease    Fatty liver    GERD (gastroesophageal reflux disease)    Hypercholesterolemia    Hypertension    Inguinal hernia    Internal hemorrhoids    Liver fibrosis 05/02/2015   PVC's (premature ventricular contractions)    Serrated adenoma of colon    Sinus bradycardia    Past Surgical History:  Procedure Laterality Date   KNEE ARTHROSCOPY Right June 2011   KNEE ARTHROSCOPY Left 2012   TONSILLECTOMY      reports that he quit smoking about 24 years ago. His smoking use included cigarettes. He has never used smokeless tobacco. He reports current alcohol use of about 14.0 standard drinks of alcohol per week. He reports that he does not use drugs. family history includes Asthma in his father; High blood pressure in his mother. No Known Allergies Current Outpatient Medications on File Prior to Visit  Medication Sig Dispense Refill    Ascorbic Acid (VITAMIN C) 1000 MG tablet Take 1,000 mg by mouth daily.     aspirin EC 81 MG tablet Take 1 tablet (81 mg total) by mouth daily. 90 tablet 3   Evolocumab (REPATHA SURECLICK) 140 MG/ML SOAJ Inject 140 mg into the skin every 14 (fourteen) days. 2 mL 5   ezetimibe (ZETIA) 10 MG tablet TAKE ONE TABLET BY MOUTH ONE TIME DAILY 90 tablet 0   metoprolol succinate (TOPROL-XL) 25 MG 24 hr tablet Take 0.5 tablets (12.5 mg total) by mouth daily. 45 tablet 3   Multiple Vitamin (MULTIVITAMIN) capsule Take 1 capsule by mouth daily.     rosuvastatin (CRESTOR) 40 MG tablet TAKE ONE TABLET BY MOUTH ONE TIME DAILY 90 tablet 3   sildenafil (VIAGRA) 100 MG tablet 1 tab by mouth one daily as needed 5 tablet 11   tadalafil (CIALIS) 5 MG tablet Take 1 tablet (5 mg total) by mouth daily. 90 tablet 3   No current facility-administered medications on file prior to visit.        ROS:  All others reviewed and negative.  Objective        PE:  BP 122/76 (BP Location: Right Arm, Patient Position: Sitting, Cuff Size: Normal)  Pulse 60   Temp 98.1 F (36.7 C) (Oral)   Ht 5\' 10"  (1.778 m)   Wt 187 lb (84.8 kg)   SpO2 96%   BMI 26.83 kg/m                 Constitutional: Pt appears in NAD               HENT: Head: NCAT.                Right Ear: External ear normal.                 Left Ear: External ear normal.                Eyes: . Pupils are equal, round, and reactive to light. Conjunctivae and EOM are normal               Nose: without d/c or deformity               Neck: Neck supple. Gross normal ROM               Cardiovascular: Normal rate and regular rhythm.                 Pulmonary/Chest: Effort normal and breath sounds without rales or wheezing.                Abd:  Soft, NT, ND, + BS, no organomegaly               Neurological: Pt is alert. At baseline orientation, motor grossly intact               Skin: Skin is warm. No rashes, no other new lesions, LE edema - none                Psychiatric: Pt behavior is normal without agitation   Micro: none  Cardiac tracings I have personally interpreted today:  none  Pertinent Radiological findings (summarize): none   Lab Results  Component Value Date   WBC 5.9 10/29/2022   HGB 16.1 10/29/2022   HCT 48.5 10/29/2022   PLT 215.0 10/29/2022   GLUCOSE 104 (H) 10/29/2022   CHOL 156 10/29/2022   TRIG 90.0 10/29/2022   HDL 59.00 10/29/2022   LDLDIRECT 164.0 04/18/2012   LDLCALC 79 10/29/2022   ALT 23 10/29/2022   AST 20 10/29/2022   NA 139 10/29/2022   K 4.2 10/29/2022   CL 103 10/29/2022   CREATININE 1.16 10/29/2022   BUN 23 10/29/2022   CO2 26 10/29/2022   TSH 1.59 10/29/2022   PSA 1.91 09/22/2021   HGBA1C 5.8 10/29/2022   Assessment/Plan:  Jevonte Baucom is a 67 y.o. White or Caucasian [1] male with  has a past medical history of Allergic rhinitis, Anxiety state (05/02/2015), Chronic RLQ pain (05/02/2015), Colon polyps, Coronary artery disease, Fatty liver, GERD (gastroesophageal reflux disease), Hypercholesterolemia, Hypertension, Inguinal hernia, Internal hemorrhoids, Liver fibrosis (05/02/2015), PVC's (premature ventricular contractions), Serrated adenoma of colon, and Sinus bradycardia.  Benign prostatic hyperplasia D;w pt, has mild obstructive symptoms, ok to start the cialis 5 mg  Essential hypertension BP Readings from Last 3 Encounters:  12/28/22 122/76  11/19/22 120/82  10/30/22 122/70   Stable, pt to continue medical treatment toprol xl 12.5 every day,   Hyperglycemia Lab Results  Component Value Date   HGBA1C 5.8 10/29/2022   Stable, pt to continue current medical treatment  - diet, wt control  Vitamin D deficiency Last vitamin D Lab Results  Component Value Date   VD25OH 39.03 10/29/2022   Low, to start oral replacement   History of adenomatous polyp of colon I encouraged pt to f/u again with respect to f/u colonoscopy at 10 yrs instead of I supsect may be more appropriate for 7  yrs  Followup: Return in about 6 months (around 06/30/2023).  Oliver Barre, MD 12/31/2022 9:34 PM [facility]

## 2022-12-31 ENCOUNTER — Encounter: Payer: Self-pay | Admitting: Internal Medicine

## 2022-12-31 DIAGNOSIS — Z86010 Personal history of colonic polyps: Secondary | ICD-10-CM | POA: Insufficient documentation

## 2022-12-31 NOTE — Assessment & Plan Note (Signed)
D;w pt, has mild obstructive symptoms, ok to start the cialis 5 mg

## 2022-12-31 NOTE — Assessment & Plan Note (Signed)
Lab Results  Component Value Date   HGBA1C 5.8 10/29/2022   Stable, pt to continue current medical treatment  - diet, wt control  

## 2022-12-31 NOTE — Assessment & Plan Note (Signed)
I encouraged pt to f/u again with respect to f/u colonoscopy at 11 yrs instead of I supsect may be more appropriate for 7 yrs

## 2022-12-31 NOTE — Assessment & Plan Note (Signed)
BP Readings from Last 3 Encounters:  12/28/22 122/76  11/19/22 120/82  10/30/22 122/70   Stable, pt to continue medical treatment toprol xl 12.5 every day,

## 2022-12-31 NOTE — Assessment & Plan Note (Signed)
Last vitamin D Lab Results  Component Value Date   VD25OH 39.03 10/29/2022   Low, to start oral replacement

## 2023-01-25 ENCOUNTER — Telehealth: Payer: Self-pay | Admitting: Internal Medicine

## 2023-01-25 ENCOUNTER — Encounter: Payer: Self-pay | Admitting: Cardiovascular Disease

## 2023-01-25 DIAGNOSIS — E785 Hyperlipidemia, unspecified: Secondary | ICD-10-CM

## 2023-01-25 DIAGNOSIS — R739 Hyperglycemia, unspecified: Secondary | ICD-10-CM

## 2023-01-25 DIAGNOSIS — I2510 Atherosclerotic heart disease of native coronary artery without angina pectoris: Secondary | ICD-10-CM

## 2023-01-25 NOTE — Telephone Encounter (Signed)
Pt already had the prevnar 20 in June 2024  He is up to date with the pneumonia shot for now.  thanks

## 2023-01-25 NOTE — Telephone Encounter (Signed)
Called and spoke with Pt

## 2023-01-25 NOTE — Telephone Encounter (Signed)
Patient would like to know if they can get a pneumonia vaccine. Best callback is [phone].

## 2023-02-08 ENCOUNTER — Other Ambulatory Visit: Payer: Self-pay

## 2023-02-08 DIAGNOSIS — I2510 Atherosclerotic heart disease of native coronary artery without angina pectoris: Secondary | ICD-10-CM | POA: Diagnosis not present

## 2023-02-08 DIAGNOSIS — R739 Hyperglycemia, unspecified: Secondary | ICD-10-CM | POA: Diagnosis not present

## 2023-02-08 DIAGNOSIS — E785 Hyperlipidemia, unspecified: Secondary | ICD-10-CM

## 2023-02-09 ENCOUNTER — Encounter: Payer: Self-pay | Admitting: Internal Medicine

## 2023-02-09 LAB — LIPID PANEL
Chol/HDL Ratio: 1.7 {ratio} (ref 0.0–5.0)
Cholesterol, Total: 119 mg/dL (ref 100–199)
HDL: 70 mg/dL (ref >39)
LDL Chol Calc (NIH): 38 mg/dL (ref 0–99)
Triglycerides: 43 mg/dL (ref 0–149)
VLDL Cholesterol Cal: 11 mg/dL (ref 5–40)

## 2023-02-09 LAB — HEMOGLOBIN A1C
Est. average glucose Bld gHb Est-mCnc: 117 mg/dL
Hgb A1c MFr Bld: 5.7 % — ABNORMAL HIGH (ref 4.8–5.6)

## 2023-03-13 ENCOUNTER — Encounter: Payer: Self-pay | Admitting: Cardiovascular Disease

## 2023-03-17 ENCOUNTER — Ambulatory Visit (INDEPENDENT_AMBULATORY_CARE_PROVIDER_SITE_OTHER): Payer: Medicare HMO | Admitting: Internal Medicine

## 2023-03-17 ENCOUNTER — Encounter: Payer: Self-pay | Admitting: Internal Medicine

## 2023-03-17 VITALS — BP 128/78 | HR 59 | Temp 97.8°F | Ht 70.0 in | Wt 184.0 lb

## 2023-03-17 DIAGNOSIS — E7800 Pure hypercholesterolemia, unspecified: Secondary | ICD-10-CM

## 2023-03-17 DIAGNOSIS — R739 Hyperglycemia, unspecified: Secondary | ICD-10-CM

## 2023-03-17 DIAGNOSIS — J329 Chronic sinusitis, unspecified: Secondary | ICD-10-CM | POA: Diagnosis not present

## 2023-03-17 DIAGNOSIS — F5101 Primary insomnia: Secondary | ICD-10-CM

## 2023-03-17 DIAGNOSIS — G4700 Insomnia, unspecified: Secondary | ICD-10-CM | POA: Insufficient documentation

## 2023-03-17 MED ORDER — AZITHROMYCIN 250 MG PO TABS: ORAL_TABLET | ORAL | 1 refills | Status: AC

## 2023-03-17 NOTE — Assessment & Plan Note (Signed)
Lab Results  Component Value Date   HGBA1C 5.7 (H) 02/08/2023   Stable, pt to continue current medical treatment  - diet, wt control

## 2023-03-17 NOTE — Assessment & Plan Note (Signed)
Mild to mod, for melatonin 10 mg at bedtime prn,,  to f/u any worsening symptoms or concerns

## 2023-03-17 NOTE — Assessment & Plan Note (Signed)
Mild to mod, for antibx course zpack,  to f/u any worsening symptoms or concerns 

## 2023-03-17 NOTE — Assessment & Plan Note (Signed)
With recent elevated lipo A - pt to continue repatha, declines f/u lab for now

## 2023-03-17 NOTE — Progress Notes (Signed)
Patient ID: Richard Roach, male   DOB: [birth year], 67 y.o.   MRN: [id number]        Chief Complaint: follow up sinusitis, hyperglycemia, hld, insomnia       HPI:  Richard Roach is a 67 y.o. male  Here with 2-3 days acute onset fever, facial pain, pressure, headache, general weakness and malaise, and greenish d/c, with mild ST and cough, but pt denies chest pain, wheezing, increased sob or doe, orthopnea, PND, increased LE swelling, palpitations, dizziness or syncope.   Pt denies polydipsia, polyuria, or new focal neuro s/s.   Unable to sleep mst nights recently.  Did also have recent July 2024 lab with elevated lipoprotein A 202just prior to start repatha and tolerating well         Wt Readings from Last 3 Encounters:  03/17/23 184 lb (83.5 kg)  12/28/22 187 lb (84.8 kg)  11/19/22 187 lb (84.8 kg)   BP Readings from Last 3 Encounters:  03/17/23 128/78  12/28/22 122/76  11/19/22 120/82         Past Medical History:  Diagnosis Date   Allergic rhinitis    Anxiety state 05/02/2015   Chronic RLQ pain 05/02/2015   Colon polyps    Coronary artery disease    Fatty liver    GERD (gastroesophageal reflux disease)    Hypercholesterolemia    Hypertension    Inguinal hernia    Internal hemorrhoids    Liver fibrosis 05/02/2015   PVC's (premature ventricular contractions)    Serrated adenoma of colon    Sinus bradycardia    Past Surgical History:  Procedure Laterality Date   KNEE ARTHROSCOPY Right June 2011   KNEE ARTHROSCOPY Left 2012   TONSILLECTOMY      reports that he quit smoking about 24 years ago. His smoking use included cigarettes. He has never used smokeless tobacco. He reports current alcohol use of about 14.0 standard drinks of alcohol per week. He reports that he does not use drugs. family history includes Asthma in his father; High blood pressure in his mother. No Known Allergies Current Outpatient Medications on File Prior to Visit  Medication Sig Dispense Refill   Ascorbic Acid  (VITAMIN C) 1000 MG tablet Take 1,000 mg by mouth daily.     aspirin EC 81 MG tablet Take 1 tablet (81 mg total) by mouth daily. 90 tablet 3   Evolocumab (REPATHA SURECLICK) 140 MG/ML SOAJ Inject 140 mg into the skin every 14 (fourteen) days. 2 mL 5   ezetimibe (ZETIA) 10 MG tablet TAKE ONE TABLET BY MOUTH ONE TIME DAILY 90 tablet 0   metoprolol succinate (TOPROL-XL) 25 MG 24 hr tablet Take 0.5 tablets (12.5 mg total) by mouth daily. 45 tablet 3   Multiple Vitamin (MULTIVITAMIN) capsule Take 1 capsule by mouth daily.     rosuvastatin (CRESTOR) 40 MG tablet TAKE ONE TABLET BY MOUTH ONE TIME DAILY 90 tablet 3   sildenafil (VIAGRA) 100 MG tablet 1 tab by mouth one daily as needed 5 tablet 11   tadalafil (CIALIS) 5 MG tablet Take 1 tablet (5 mg total) by mouth daily. 90 tablet 3   No current facility-administered medications on file prior to visit.        ROS:  All others reviewed and negative.  Objective        PE:  BP 128/78 (BP Location: Left Arm, Patient Position: Sitting, Cuff Size: Normal)   Pulse (!) 59   Temp 97.8 F (36.6 C) (Oral)  Ht 5\' 10"  (1.778 m)   Wt 184 lb (83.5 kg)   SpO2 98%   BMI 26.40 kg/m                 Constitutional: Pt appears in NAD               HENT: Head: NCAT.                Right Ear: External ear normal.                 Left Ear: External ear normal. Bilat tm's with mild erythema.  Max sinus areas mild tender.  Pharynx with mild erythema, no exudate                Eyes: . Pupils are equal, round, and reactive to light. Conjunctivae and EOM are normal               Nose: without d/c or deformity               Neck: Neck supple. Gross normal ROM               Cardiovascular: Normal rate and regular rhythm.                 Pulmonary/Chest: Effort normal and breath sounds without rales or wheezing.                Abd:  Soft, NT, ND, + BS, no organomegaly               Neurological: Pt is alert. At baseline orientation, motor grossly intact                Skin: Skin is warm. No rashes, no other new lesions, LE edema - none               Psychiatric: Pt behavior is normal without agitation   Micro: none  Cardiac tracings I have personally interpreted today:  none  Pertinent Radiological findings (summarize): none   Lab Results  Component Value Date   WBC 5.9 10/29/2022   HGB 16.1 10/29/2022   HCT 48.5 10/29/2022   PLT 215.0 10/29/2022   GLUCOSE 104 (H) 10/29/2022   CHOL 119 02/08/2023   TRIG 43 02/08/2023   HDL 70 02/08/2023   LDLDIRECT 164.0 04/18/2012   LDLCALC 38 02/08/2023   ALT 23 10/29/2022   AST 20 10/29/2022   NA 139 10/29/2022   K 4.2 10/29/2022   CL 103 10/29/2022   CREATININE 1.16 10/29/2022   BUN 23 10/29/2022   CO2 26 10/29/2022   TSH 1.59 10/29/2022   PSA 1.91 09/22/2021   HGBA1C 5.7 (H) 02/08/2023   Assessment/Plan:  Richard Roach is a 67 y.o. White or Caucasian [1] male with  has a past medical history of Allergic rhinitis, Anxiety state (05/02/2015), Chronic RLQ pain (05/02/2015), Colon polyps, Coronary artery disease, Fatty liver, GERD (gastroesophageal reflux disease), Hypercholesterolemia, Hypertension, Inguinal hernia, Internal hemorrhoids, Liver fibrosis (05/02/2015), PVC's (premature ventricular contractions), Serrated adenoma of colon, and Sinus bradycardia.  Sinusitis Mild to mod, for antibx course zpack,  to f/u any worsening symptoms or concerns  Hypercholesterolemia With recent elevated lipo A - pt to continue repatha, declines f/u lab for now  Hyperglycemia Lab Results  Component Value Date   HGBA1C 5.7 (H) 02/08/2023   Stable, pt to continue current medical treatment  - diet, wt control   Insomnia Mild to mod, for melatonin 10  mg at bedtime prn,,  to f/u any worsening symptoms or concerns  Followup: Return if symptoms worsen or fail to improve.  Oliver Barre, MD 03/17/2023 7:54 PM [facility] - [facility]

## 2023-03-17 NOTE — Patient Instructions (Signed)
Please take all new medication as prescribed - the antibiotic  Please continue all other medications as before, including the repatha for the elevated Lipoprotein A  Ok to take the melatonin up to 10 mg at bedtime for sleep  Please have the pharmacy call with any other refills you may need.  Please continue your efforts at being more active, low cholesterol diet, and weight control to keep the sugar down.  Please keep your appointments with your specialists as you may have planned

## 2023-03-19 DIAGNOSIS — R0781 Pleurodynia: Secondary | ICD-10-CM | POA: Diagnosis not present

## 2023-03-22 ENCOUNTER — Other Ambulatory Visit: Payer: Self-pay | Admitting: Internal Medicine

## 2023-03-23 ENCOUNTER — Other Ambulatory Visit: Payer: Self-pay

## 2023-04-17 DIAGNOSIS — R69 Illness, unspecified: Secondary | ICD-10-CM | POA: Diagnosis not present

## 2023-04-25 DIAGNOSIS — R69 Illness, unspecified: Secondary | ICD-10-CM | POA: Diagnosis not present

## 2023-04-26 ENCOUNTER — Ambulatory Visit: Payer: Medicare HMO | Attending: Cardiovascular Disease

## 2023-04-26 ENCOUNTER — Telehealth: Payer: Self-pay | Admitting: Cardiovascular Disease

## 2023-04-26 DIAGNOSIS — R002 Palpitations: Secondary | ICD-10-CM

## 2023-04-26 DIAGNOSIS — I493 Ventricular premature depolarization: Secondary | ICD-10-CM

## 2023-04-26 MED ORDER — METOPROLOL SUCCINATE ER 25 MG PO TB24: 25.0000 mg | ORAL_TABLET | Freq: Every evening | ORAL | 2 refills | Status: DC

## 2023-04-26 NOTE — Telephone Encounter (Signed)
Patient identification verified by 2 forms. Richard Rail, RN    Called and spoke to patient  Patient states:   -he is experiencing heart palpitation   -Sx off and on for 4 days   -Had palpitation 2 years ago   -currently feelings palpations, feels heart is beating irregular   -also has achy pain in legs for 4 days, unsure if related   -Takes Metoprolol Succinate 12.5mg  daily   -BP this morning: 139/83 HR: 62 Patient denies:   -SOB/difficulty breathing   -chest pain  Informed patient RN will speak to provider and outreach with recommendations  Patient agrees, no questions at this time

## 2023-04-26 NOTE — Progress Notes (Unsigned)
Enrolled for [employer] to mail a ZIO XT long term holter monitor to the patients address on file.  

## 2023-04-26 NOTE — Telephone Encounter (Signed)
Spoke to Dr. Flora Lipps who recommends:   -1 week zio event monitor   -Take Metoprolol Succinate 25mg  at night   -Nature Made Magnesium 400mg   ____________________________________________ Patient identification verified by 2 forms. Marilynn Rail, RN    Called and spoke to patient  Relayed provider recommendations  Informed patient updated Metoprolol Rx sent to pharmacy  Advised patient to start taking Metoprolol 25mg  nightly tomorrow since he took daily dose tomorrow night  RN Reviewed ED warning signs/precautions  Patient verbalized understanding, no questions or concerns at this time

## 2023-04-26 NOTE — Telephone Encounter (Signed)
Patient c/o Palpitations:  STAT if patient reporting lightheadedness, shortness of breath, or chest pain  How long have you had palpitations/irregular HR/ Afib? Patient states he has been having palpitations for about 3 days now. Are you having the symptoms now? Yes   Are you currently experiencing lightheadedness, SOB or CP? no  Do you have a history of afib (atrial fibrillation) or irregular heart rhythm? Patient states he had palpitations about two years ago.   Have you checked your BP or HR? (document readings if available): no  Are you experiencing any other symptoms? Patient states his right leg, his knee down to his ankle feels sore for the past 3-4 days.   He might have over done it in the yard.

## 2023-04-28 DIAGNOSIS — R002 Palpitations: Secondary | ICD-10-CM | POA: Diagnosis not present

## 2023-04-28 DIAGNOSIS — I493 Ventricular premature depolarization: Secondary | ICD-10-CM

## 2023-05-09 DIAGNOSIS — I493 Ventricular premature depolarization: Secondary | ICD-10-CM | POA: Diagnosis not present

## 2023-05-09 DIAGNOSIS — R002 Palpitations: Secondary | ICD-10-CM | POA: Diagnosis not present

## 2023-05-13 DIAGNOSIS — R519 Headache, unspecified: Secondary | ICD-10-CM | POA: Diagnosis not present

## 2023-05-13 DIAGNOSIS — R0981 Nasal congestion: Secondary | ICD-10-CM | POA: Diagnosis not present

## 2023-05-14 ENCOUNTER — Telehealth: Payer: Self-pay | Admitting: Gastroenterology

## 2023-05-14 ENCOUNTER — Ambulatory Visit (INDEPENDENT_AMBULATORY_CARE_PROVIDER_SITE_OTHER): Payer: PPO

## 2023-05-14 DIAGNOSIS — Z0000 Encounter for general adult medical examination without abnormal findings: Secondary | ICD-10-CM

## 2023-05-14 NOTE — Patient Instructions (Signed)
 Mr. Richard Roach , Thank you for taking time to come for your Medicare Wellness Visit. I appreciate your ongoing commitment to your health goals. Please review the following plan we discussed and let me know if I can assist you in the future.   Referrals/Orders/Follow-Ups/Clinician Recommendations: none  This is a list of the screening recommended for you and due dates:  Health Maintenance  Topic Date Due   Colon Cancer Screening  09/14/2022   COVID-19 Vaccine (4 - 2024-25 season) 01/10/2023   Medicare Annual Wellness Visit  05/13/2024   DTaP/Tdap/Td vaccine (5 - Td or Tdap) 07/12/2030   Pneumonia Vaccine  Completed   Flu Shot  Completed   Hepatitis C Screening  Completed   Zoster (Shingles) Vaccine  Completed   HPV Vaccine  Aged Out    Advanced directives: (Copy Requested) Please bring a copy of your health care power of attorney and living will to the office to be added to your chart at your convenience.  Next Medicare Annual Wellness Visit scheduled for next year: No, will make next year  insert Preventive Care attachment Insert FALL PREVENTION attachment if needed

## 2023-05-14 NOTE — Telephone Encounter (Signed)
 The pt has been given information on why the change in recall.  He has questions still about the change.  He would like to make an appt but needs to review the providers and call back to make an appt due to Dr Aneita retirement.  He will review and call back

## 2023-05-14 NOTE — Telephone Encounter (Signed)
 Patient called regarding recall change from 2024 to 2029 stated his primary care physician advised him due to his medical history, he should have one now. The patient requested to speak to the nurse to discuss further concerns.

## 2023-05-14 NOTE — Progress Notes (Signed)
 Subjective:   Richard Roach is a 68 y.o. male who presents for an Initial Medicare Annual Wellness Visit.  Visit Complete: Virtual I connected with  Richard Roach on 05/14/23 by a audio enabled telemedicine application and verified that I am speaking with the correct person using two identifiers.  Interactive audio and video telecommunications were attempted between this provider and patient, however failed, due to patient having technical difficulties OR patient did not have access to video capability.  We continued and completed visit with audio only.   Patient Location: Home  Provider Location: Home Office  I discussed the limitations of evaluation and management by telemedicine. The patient expressed understanding and agreed to proceed.  Vital Signs: Because this visit was a virtual/telehealth visit, some criteria may be missing or patient reported. Any vitals not documented were not able to be obtained and vitals that have been documented are patient reported.    Cardiac Risk Factors include: advanced age (>56men, >1 women);hypertension;male gender     Objective:    Today's Vitals   There is no height or weight on file to calculate BMI.     05/14/2023    8:18 AM 10/30/2021   12:38 AM 09/03/2017    7:24 PM 04/18/2017    3:33 PM 07/14/2014    2:17 PM 04/24/2014    8:34 AM  Advanced Directives  Does Patient Have a Medical Advance Directive? Yes No No No No No  Type of Estate Agent of [place];Living will       Copy of Healthcare Power of Attorney in Chart? No - copy requested       Would patient like information on creating a medical advance directive?  No - Patient declined  Yes (ED - Information included in AVS) No - patient declined information Yes - Educational materials given    Current Medications (verified) Outpatient Encounter Medications as of 05/14/2023  Medication Sig   Ascorbic Acid (VITAMIN C) 1000 MG tablet Take 1,000 mg by mouth daily.   aspirin   EC 81 MG tablet Take 1 tablet (81 mg total) by mouth daily.   Evolocumab  (REPATHA  SURECLICK) 140 MG/ML SOAJ Inject 140 mg into the skin every 14 (fourteen) days.   ezetimibe  (ZETIA ) 10 MG tablet TAKE ONE TABLET BY MOUTH ONE TIME DAILY (Patient not taking: Reported on 05/14/2023)   metoprolol  succinate (TOPROL -XL) 25 MG 24 hr tablet Take 1 tablet (25 mg total) by mouth at bedtime.   Multiple Vitamin (MULTIVITAMIN) capsule Take 1 capsule by mouth daily.   rosuvastatin  (CRESTOR ) 40 MG tablet TAKE ONE TABLET BY MOUTH ONE TIME DAILY   tadalafil  (CIALIS ) 5 MG tablet Take 1 tablet (5 mg total) by mouth daily.   sildenafil  (VIAGRA ) 100 MG tablet 1 tab by mouth one daily as needed (Patient not taking: Reported on 05/14/2023)   No facility-administered encounter medications on file as of 05/14/2023.    Allergies (verified) Patient has no known allergies.   History: Past Medical History:  Diagnosis Date   Allergic rhinitis    Anxiety state 05/02/2015   Chronic RLQ pain 05/02/2015   Colon polyps    Coronary artery disease    Fatty liver    GERD (gastroesophageal reflux disease)    Hypercholesterolemia    Hypertension    Inguinal hernia    Internal hemorrhoids    Liver fibrosis 05/02/2015   PVC's (premature ventricular contractions)    Serrated adenoma of colon    Sinus bradycardia    Past Surgical  History:  Procedure Laterality Date   KNEE ARTHROSCOPY Right June 2011   KNEE ARTHROSCOPY Left 2012   TONSILLECTOMY     Family History  Problem Relation Age of Onset   Asthma Father    High blood pressure Mother    Colon cancer Neg Hx    Colon polyps Neg Hx    Diabetes Neg Hx    Kidney disease Neg Hx    Esophageal cancer Neg Hx    Liver cancer Neg Hx    Rectal cancer Neg Hx    Stomach cancer Neg Hx    Social History   Socioeconomic History   Marital status: Married    Spouse name: Not on file   Number of children: 2   Years of education: Not on file   Highest education level: Not  on file  Occupational History   Occupation: Hydrographic Surveyor  Tobacco Use   Smoking status: Former    Current packs/day: 0.00    Types: Cigarettes    Quit date: 05/11/1998    Years since quitting: 25.0   Smokeless tobacco: Never   Tobacco comments:    smoked only socially x 5 yrs  Substance and Sexual Activity   Alcohol use: Yes    Alcohol/week: 14.0 standard drinks of alcohol    Types: 14 Glasses of wine per week   Drug use: No   Sexual activity: Yes  Other Topics Concern   Not on file  Social History Narrative   He lives with his wife, he is a transport planner, travel, desk job, no smoking.   Social Drivers of Corporate Investment Banker Strain: Low Risk  (05/14/2023)   Overall Financial Resource Strain (CARDIA)    Difficulty of Paying Living Expenses: Not hard at all  Food Insecurity: No Food Insecurity (05/14/2023)   Hunger Vital Sign    Worried About Running Out of Food in the Last Year: Never true    Ran Out of Food in the Last Year: Never true  Transportation Needs: No Transportation Needs (05/14/2023)   PRAPARE - Administrator, Civil Service (Medical): No    Lack of Transportation (Non-Medical): No  Physical Activity: Sufficiently Active (05/14/2023)   Exercise Vital Sign    Days of Exercise per Week: 5 days    Minutes of Exercise per Session: 30 min  Stress: No Stress Concern Present (05/14/2023)   [employer] of Occupational Health - Occupational Stress Questionnaire    Feeling of Stress : Not at all  Social Connections: Moderately Isolated (05/14/2023)   Social Connection and Isolation Panel [NHANES]    Frequency of Communication with Friends and Family: Three times a week    Frequency of Social Gatherings with Friends and Family: Not on file    Attends Religious Services: Never    Database Administrator or Organizations: No    Attends Engineer, Structural: Never    Marital Status: Married    Tobacco Counseling Counseling given: Not  Answered Tobacco comments: smoked only socially x 5 yrs   Clinical Intake:  Pre-visit preparation completed: Yes  Pain : No/denies pain     Nutritional Risks: None Diabetes: No  How often do you need to have someone help you when you read instructions, pamphlets, or other written materials from your doctor or pharmacy?: 1 - Never  Interpreter Needed?: No  Information entered by :: NAllen LPN   Activities of Daily Living    05/14/2023  8:12 AM  In your present state of health, do you have any difficulty performing the following activities:  Hearing? 0  Vision? 0  Difficulty concentrating or making decisions? 0  Walking or climbing stairs? 0  Dressing or bathing? 0  Doing errands, shopping? 0  Preparing Food and eating ? N  Using the Toilet? N  In the past six months, have you accidently leaked urine? N  Do you have problems with loss of bowel control? N  Managing your Medications? N  Managing your Finances? N  Housekeeping or managing your Housekeeping? N    Patient Care Team: Norleen Lynwood ORN, MD as PCP - General (Internal Medicine) O'Neal, Darryle Ned, MD as PCP - Cardiology (Cardiology)  Indicate any recent Medical Services you may have received from other than [facility] providers in the past year (date may be approximate).     Assessment:   This is a routine wellness examination for Richard Roach.  Hearing/Vision screen Hearing Screening - Comments:: Denies hearing issues Vision Screening - Comments:: Regular eye exams, Dr. Wilma   Goals Addressed             This Visit's Progress    Patient Stated       05/14/2023, denies goals       Depression Screen    05/14/2023    8:20 AM 03/17/2023    3:24 PM 12/28/2022    8:20 AM 11/19/2022    8:51 AM 10/29/2022    8:19 AM 09/22/2021    8:20 AM 09/22/2021    8:06 AM  PHQ 2/9 Scores  PHQ - 2 Score 0 0 0 0 0 0 0  PHQ- 9 Score       0    Fall Risk    05/14/2023    8:19 AM 03/17/2023    3:24 PM 12/28/2022    8:19 AM  11/19/2022    8:51 AM 10/29/2022    8:19 AM  Fall Risk   Falls in the past year? 0 0 0 0 0  Number falls in past yr: 0 0 0 0 0  Injury with Fall? 0 0 0 0 0  Risk for fall due to : Medication side effect No Fall Risks No Fall Risks No Fall Risks No Fall Risks  Follow up Falls prevention discussed;Falls evaluation completed Falls evaluation completed Falls evaluation completed Falls evaluation completed Falls evaluation completed    MEDICARE RISK AT HOME: Medicare Risk at Home Any stairs in or around the home?: Yes If so, are there any without handrails?: No Home free of loose throw rugs in walkways, pet beds, electrical cords, etc?: Yes Adequate lighting in your home to reduce risk of falls?: Yes Life alert?: No Use of a cane, walker or w/c?: No Grab bars in the bathroom?: No Shower chair or bench in shower?: Yes Elevated toilet seat or a handicapped toilet?: Yes  TIMED UP AND GO:  Was the test performed? No    Cognitive Function:        05/14/2023    8:20 AM  6CIT Screen  What Year? 0 points  What month? 0 points  What time? 0 points  Count back from 20 0 points  Months in reverse 0 points  Repeat phrase 0 points  Total Score 0 points    Immunizations Immunization History  Administered Date(s) Administered   DTP 04/10/1998   Fluad Quad(high Dose 65+) 02/18/2023   Hep A / Hep B 11/01/2014, 04/10/2015   Hepatitis B, ADULT 11/22/2014  Hepatitis B, PED/ADOLESCENT 11/22/2014   Influenza Split 03/11/2013   Influenza Whole 05/11/2009, 03/11/2010, 01/10/2011, 03/11/2012   Influenza,inj,Quad PF,6+ Mos 01/19/2014, 04/07/2017, 02/20/2018, 01/24/2019, 02/22/2020   Influenza-Unspecified 02/09/2015   PFIZER(Purple Top)SARS-COV-2 Vaccination 06/01/2019, 06/22/2019, 03/26/2020   PNEUMOCOCCAL CONJUGATE-20 10/29/2022   Pneumococcal Polysaccharide-23 05/12/2003   Rsv, Mab, [place], 0.5 Ml, Neonate To 24 Mos(Beyfortus) 02/18/2023   Td 09/23/2007   Tdap 05/06/2013,  07/11/2020   Zoster Recombinant(Shingrix ) 12/11/2019, 02/09/2020, 09/22/2021    TDAP status: Up to date  Flu Vaccine status: Up to date  Pneumococcal vaccine status: Up to date  Covid-19 vaccine status: Information provided on how to obtain vaccines.   Qualifies for Shingles Vaccine? Yes   Zostavax completed Yes   Shingrix  Completed?: Yes  Screening Tests Health Maintenance  Topic Date Due   Colonoscopy  09/14/2022   COVID-19 Vaccine (4 - 2024-25 season) 01/10/2023   Medicare Annual Wellness (AWV)  05/13/2024   DTaP/Tdap/Td (5 - Td or Tdap) 07/12/2030   Pneumonia Vaccine 61+ Years old  Completed   INFLUENZA VACCINE  Completed   Hepatitis C Screening  Completed   Zoster Vaccines- Shingrix   Completed   HPV VACCINES  Aged Out    Health Maintenance  Health Maintenance Due  Topic Date Due   Colonoscopy  09/14/2022   COVID-19 Vaccine (4 - 2024-25 season) 01/10/2023    Colorectal cancer screening: Type of screening: Colonoscopy. Completed 09/13/2017. Repeat every 5 years  Lung Cancer Screening: (Low Dose CT Chest recommended if Age 66-80 years, 20 pack-year currently smoking OR have quit w/in 15years.) does not qualify.   Lung Cancer Screening Referral: no  Additional Screening:  Hepatitis C Screening: does qualify; Completed 07/10/2014  Vision Screening: Recommended annual ophthalmology exams for early detection of glaucoma and other disorders of the eye. Is the patient up to date with their annual eye exam?  Yes  Who is the provider or what is the name of the office in which the patient attends annual eye exams? Dr. Wilma If pt is not established with a provider, would they like to be referred to a provider to establish care? No .   Dental Screening: Recommended annual dental exams for proper oral hygiene  Diabetic Foot Exam: n/a  Community Resource Referral / Chronic Care Management: CRR required this visit?  No   CCM required this visit?  No    Plan:      I have personally reviewed and noted the following in the patient's chart:   Medical and social history Use of alcohol, tobacco or illicit drugs  Current medications and supplements including opioid prescriptions. Patient is not currently taking opioid prescriptions. Functional ability and status Nutritional status Physical activity Advanced directives List of other physicians Hospitalizations, surgeries, and ER visits in previous 12 months Vitals Screenings to include cognitive, depression, and falls Referrals and appointments  In addition, I have reviewed and discussed with patient certain preventive protocols, quality metrics, and best practice recommendations. A written personalized care plan for preventive services as well as general preventive health recommendations were provided to patient.     Ardella FORBES Dawn, LPN   12/14/7972   After Visit Summary: (MyChart) Due to this being a telephonic visit, the after visit summary with patients personalized plan was offered to patient via MyChart   Nurse Notes: none

## 2023-05-21 ENCOUNTER — Telehealth: Payer: Self-pay

## 2023-05-21 NOTE — Telephone Encounter (Signed)
-----   Message from Reatha Harps sent at 05/13/2023 10:27 PM EST ----- Normal monitor. My chart. -W

## 2023-05-21 NOTE — Telephone Encounter (Signed)
 Patient identification verified by 2 forms. Shade Flood, RN     Called patient but unable to leave vm due to vm being full. Letter sent to address on file

## 2023-05-31 ENCOUNTER — Other Ambulatory Visit: Payer: Self-pay | Admitting: Cardiovascular Disease

## 2023-05-31 DIAGNOSIS — I2510 Atherosclerotic heart disease of native coronary artery without angina pectoris: Secondary | ICD-10-CM

## 2023-05-31 DIAGNOSIS — E785 Hyperlipidemia, unspecified: Secondary | ICD-10-CM

## 2023-06-09 ENCOUNTER — Telehealth: Payer: Self-pay | Admitting: Pharmacy Technician

## 2023-06-09 ENCOUNTER — Telehealth: Payer: Self-pay | Admitting: Cardiovascular Disease

## 2023-06-09 NOTE — Telephone Encounter (Signed)
Pharmacy Patient Advocate Encounter   Received notification from Pt Calls Messages that prior authorization for Repatha 140mg /ml is required/requested.   Insurance verification completed.   The patient is insured through [facility] ADVANTAGE/RX ADVANCE .   Per test claim: PA required; PA submitted to above mentioned insurance via Phone Key/confirmation #/EOC req id: [id number] Status is pending   Pt started the prior auth so we had to send documents and answer questions over the phone. I faxed documents to insurance

## 2023-06-09 NOTE — Telephone Encounter (Signed)
Caller Suzy Bouchard) stated they will need further information regarding prior authorization for patient's Evolocumab (REPATHA SURECLICK) 140 MG/ML SOAJ .  Caller stated he will also be faxing the request  for further information.

## 2023-06-10 NOTE — Telephone Encounter (Signed)
Pharmacy Patient Advocate Encounter  Received notification from [facility] ADVANTAGE/RX ADVANCE that Prior Authorization for Repatha SureClick 140MG /ML auto-injectors has been APPROVED from 06/09/23 to 12/07/23. Unable to obtain price due to refill too soon rejection, last fill date 06/01/23 next available fill date2/11/25  I called their pharmacy and they said the patient picked up already   PA #/Case ID/Reference #: [id number]

## 2023-07-27 ENCOUNTER — Ambulatory Visit
Admission: EM | Admit: 2023-07-27 | Discharge: 2023-07-27 | Disposition: A | Discharge status: Home / Self Care | Attending: Family Medicine | Admitting: Family Medicine

## 2023-07-27 ENCOUNTER — Encounter: Payer: Self-pay | Admitting: Emergency Medicine

## 2023-07-27 ENCOUNTER — Other Ambulatory Visit: Payer: Self-pay

## 2023-07-27 DIAGNOSIS — J069 Acute upper respiratory infection, unspecified: Secondary | ICD-10-CM

## 2023-07-27 MED ORDER — AMOXICILLIN-POT CLAVULANATE 875-125 MG PO TABS: 1.0000 | ORAL_TABLET | Freq: Two times a day (BID) | ORAL | 0 refills | Status: DC

## 2023-07-27 NOTE — Discharge Instructions (Addendum)
 Continue the over-the-counter medications.  If your symptoms last longer than 2 weeks or you develop fever you can fill the prescription for antibiotics

## 2023-07-27 NOTE — ED Triage Notes (Signed)
 Pt c/o chest congestion, headache, and a little bit of a cough for 1.5weeks

## 2023-07-27 NOTE — ED Provider Notes (Signed)
 [name]    CSN: [id number] Arrival date & time: 07/27/23  0901      History   Chief Complaint Chief Complaint  Patient presents with   Nasal Congestion    HPI Richard Roach is a 68 y.o. male.   HPI Has had a cough for approximately 1 week, coughs up occasional sputum, has had nasal congestion and postnasal drip. States his wife was sick for a month and a half and he is hoping to get a medication to prevent his illness from lasting a month. He has been taking over-the-counter Mucinex, Flonase and cough medicines without relief. Has occasional rhinorrhea that is clear to yellow. Denies fever, chills, sweats, body aches, fatigue, earache, sinus pain or pressure, sore throat, chest pain, shortness of breath, nausea, vomiting, diarrhea. He is not a smoker.    Past Medical History:  Diagnosis Date   Allergic rhinitis    Anxiety state 05/02/2015   Chronic RLQ pain 05/02/2015   Colon polyps    Coronary artery disease    Fatty liver    GERD (gastroesophageal reflux disease)    Hypercholesterolemia    Hypertension    Inguinal hernia    Internal hemorrhoids    Liver fibrosis 05/02/2015   PVC's (premature ventricular contractions)    Serrated adenoma of colon    Sinus bradycardia     Patient Active Problem List   Diagnosis Date Noted   Sinusitis 03/17/2023   Insomnia 03/17/2023   History of adenomatous polyp of colon 12/31/2022   Other fatigue 11/22/2022   Vitamin D deficiency 09/25/2021   Coronary artery calcification seen on CT scan 09/19/2020   Right groin pain 07/11/2020   Nasal sore 01/09/2019   Right-sided chest pain 12/14/2017   Groin lump 09/09/2017   Hyperglycemia 04/28/2017   Chest pain 04/28/2017   Cough 03/11/2017   Abnormal breath sounds 03/11/2017   Upper respiratory tract infection 01/05/2017   Erectile dysfunction 04/24/2016   Hypercholesterolemia 02/06/2016   Anxiety state 05/02/2015   Liver fibrosis 05/02/2015   Chronic RLQ pain  05/02/2015   Benign prostatic hyperplasia 05/02/2015   Colon polyps    Serrated adenoma of colon    PVC's (premature ventricular contractions)    Fatty liver 03/14/2014   Encounter for well adult exam with abnormal findings 04/25/2013   Palpitations 04/17/2013   Impacted cerumen of right ear 09/24/2012   Nontraumatic hematoma of penis 04/29/2012   Spermatocele 04/29/2012   Abdominal pain 12/06/2010   HYPERHIDROSIS 04/30/2009   Essential hypertension 06/09/2007   Allergic rhinitis 06/09/2007    Past Surgical History:  Procedure Laterality Date   KNEE ARTHROSCOPY Right June 2011   KNEE ARTHROSCOPY Left 2012   TONSILLECTOMY         Home Medications    Prior to Admission medications   Medication Sig Start Date End Date Taking? Authorizing Provider  amoxicillin-clavulanate (AUGMENTIN) 875-125 MG tablet Take 1 tablet by mouth every 12 (twelve) hours. 07/27/23  Yes Meliton Rattan, PA  ezetimibe (ZETIA) 10 MG tablet TAKE ONE TABLET BY MOUTH ONE TIME DAILY Patient not taking: Reported on 05/14/2023 03/23/23   Corwin Levins, MD  Ascorbic Acid (VITAMIN C) 1000 MG tablet Take 1,000 mg by mouth daily.    [provider]  aspirin EC 81 MG tablet Take 1 tablet (81 mg total) by mouth daily. 10/06/19   Sande Rives, MD  Evolocumab (REPATHA SURECLICK) 140 MG/ML SOAJ INJECT 140 MG UNDER THE SKIN EVERY TWO WEEKS  INTO THE THIGH, STOMACH, OR UPPER ARM 06/01/23   O'Neal, Ronnald Ramp, MD  metoprolol succinate (TOPROL-XL) 25 MG 24 hr tablet Take 1 tablet (25 mg total) by mouth at bedtime. 04/26/23   O'NealRonnald Ramp, MD  Multiple Vitamin (MULTIVITAMIN) capsule Take 1 capsule by mouth daily.    [provider]  rosuvastatin (CRESTOR) 40 MG tablet TAKE ONE TABLET BY MOUTH ONE TIME DAILY 12/21/22   Corwin Levins, MD  sildenafil (VIAGRA) 100 MG tablet 1 tab by mouth one daily as needed Patient not taking: Reported on 05/14/2023 11/12/20   Corwin Levins, MD  tadalafil (CIALIS) 5  MG tablet Take 1 tablet (5 mg total) by mouth daily. 11/19/22   Corwin Levins, MD    Family History Family History  Problem Relation Age of Onset   Asthma Father    High blood pressure Mother    Colon cancer Neg Hx    Colon polyps Neg Hx    Diabetes Neg Hx    Kidney disease Neg Hx    Esophageal cancer Neg Hx    Liver cancer Neg Hx    Rectal cancer Neg Hx    Stomach cancer Neg Hx     Social History Social History   Tobacco Use   Smoking status: Former    Current packs/day: 0.00    Types: Cigarettes    Quit date: 05/11/1998    Years since quitting: 25.2   Smokeless tobacco: Never   Tobacco comments:    smoked only socially x 5 yrs  Substance Use Topics   Alcohol use: Yes    Alcohol/week: 14.0 standard drinks of alcohol    Types: 14 Glasses of wine per week   Drug use: No     Allergies   Patient has no known allergies.   Review of Systems Review of Systems  Constitutional:  Negative for appetite change, chills, diaphoresis, fatigue and fever.  HENT:  Positive for congestion, postnasal drip and rhinorrhea. Negative for sinus pressure, sinus pain and sore throat.   Respiratory:  Positive for cough. Negative for shortness of breath and wheezing.   Cardiovascular:  Negative for chest pain.  Gastrointestinal:  Negative for abdominal pain, diarrhea, nausea and vomiting.  Musculoskeletal:  Negative for arthralgias.  Neurological:  Negative for dizziness and headaches.     Physical Exam Triage Vital Signs ED Triage Vitals  Encounter Vitals Group     BP 07/27/23 0908 (!) 160/81     Systolic BP Percentile --      Diastolic BP Percentile --      Pulse Rate 07/27/23 0908 71     Resp 07/27/23 0908 17     Temp 07/27/23 0911 98.2 F (36.8 C)     Temp Source 07/27/23 0908 Oral     SpO2 07/27/23 0908 98 %     Weight --      Height --      Head Circumference --      Peak Flow --      Pain Score 07/27/23 0911 0     Pain Loc --      Pain Education --      Exclude from  Growth Chart --    No data found.  Updated Vital Signs BP (!) 160/81   Pulse 71   Temp 98.2 F (36.8 C) (Temporal)   Resp 17   SpO2 98%   Visual Acuity Right Eye Distance:   Left Eye Distance:   Bilateral Distance:  Right Eye Near:   Left Eye Near:    Bilateral Near:     Physical Exam Vitals and nursing note reviewed.  Constitutional:      Appearance: He is not ill-appearing.  HENT:     Head: Normocephalic.     Right Ear: Tympanic membrane and ear canal normal.     Left Ear: Tympanic membrane and ear canal normal.     Nose: No congestion or rhinorrhea.     Right Sinus: No maxillary sinus tenderness or frontal sinus tenderness.     Left Sinus: No maxillary sinus tenderness or frontal sinus tenderness.     Mouth/Throat:     Pharynx: Oropharynx is clear. Uvula midline. No oropharyngeal exudate, uvula swelling or postnasal drip.     Tonsils: No tonsillar exudate or tonsillar abscesses.  Cardiovascular:     Rate and Rhythm: Normal rate and regular rhythm.     Heart sounds: Normal heart sounds.  Pulmonary:     Effort: Pulmonary effort is normal.     Breath sounds: Normal breath sounds.  Musculoskeletal:     Cervical back: Neck supple.  Lymphadenopathy:     Cervical: No cervical adenopathy.  Skin:    General: Skin is warm and dry.  Neurological:     Mental Status: He is alert and oriented to person, place, and time.  Psychiatric:        Mood and Affect: Mood normal.      UC Treatments / Results  Labs (all labs ordered are listed, but only abnormal results are displayed) Labs Reviewed - No data to display  EKG   Radiology No results found.  Procedures Procedures (including critical care time)  Medications Ordered in UC Medications - No data to display  Initial Impression / Assessment and Plan / UC Course  I have reviewed the triage vital signs and the nursing notes.  Pertinent labs & imaging results that were available during my care of the patient  were reviewed by me and considered in my medical decision making (see chart for details).    68 year old non-smoker with productive cough for 1 week, no evidence of bacterial infection on exam.  He is concerned because he has a business trip next week and his wife had a recent illness that lasted a month and a half.  Discussed with patient likely viral etiology recommend he continue over-the-counter medications if symptoms last longer than 2 weeks or he develops fever he can fill the pocket prescription he was given.  Warning signs and follow-up reviewed with patient  Final Clinical Impressions(s) / UC Diagnoses   Final diagnoses:  Acute upper respiratory infection     Discharge Instructions      Continue the over-the-counter medications.  If your symptoms last longer than 2 weeks or you develop fever you can fill the prescription for antibiotics   ED Prescriptions     Medication Sig Dispense Auth. Provider   amoxicillin-clavulanate (AUGMENTIN) 875-125 MG tablet Take 1 tablet by mouth every 12 (twelve) hours. 14 tablet Meliton Rattan, [place]      PDMP not reviewed this encounter.   Meliton Rattan, [place] 07/27/23 [phone]

## 2023-08-20 DIAGNOSIS — K760 Fatty (change of) liver, not elsewhere classified: Secondary | ICD-10-CM | POA: Diagnosis not present

## 2023-09-01 DIAGNOSIS — K567 Ileus, unspecified: Secondary | ICD-10-CM | POA: Diagnosis not present

## 2023-09-01 DIAGNOSIS — R109 Unspecified abdominal pain: Secondary | ICD-10-CM | POA: Diagnosis not present

## 2023-09-01 DIAGNOSIS — K5900 Constipation, unspecified: Secondary | ICD-10-CM | POA: Diagnosis not present

## 2023-09-01 DIAGNOSIS — R1031 Right lower quadrant pain: Secondary | ICD-10-CM | POA: Diagnosis not present

## 2023-09-09 ENCOUNTER — Ambulatory Visit: Admitting: Internal Medicine

## 2023-09-17 ENCOUNTER — Telehealth: Payer: Self-pay | Admitting: Cardiovascular Disease

## 2023-09-17 NOTE — Telephone Encounter (Signed)
 Patient brought in a insurance form for provider to fill out. Will leave in provider box

## 2023-09-27 ENCOUNTER — Ambulatory Visit (INDEPENDENT_AMBULATORY_CARE_PROVIDER_SITE_OTHER)
Admission: RE | Admit: 2023-09-27 | Discharge: 2023-09-27 | Disposition: A | Discharge status: Home / Self Care | Source: Ambulatory Visit | Attending: Physician Assistant | Admitting: Physician Assistant

## 2023-09-27 ENCOUNTER — Encounter: Payer: Self-pay | Admitting: Physician Assistant

## 2023-09-27 ENCOUNTER — Ambulatory Visit (INDEPENDENT_AMBULATORY_CARE_PROVIDER_SITE_OTHER): Admitting: Physician Assistant

## 2023-09-27 VITALS — BP 122/70 | HR 86 | Ht 70.0 in | Wt 184.0 lb

## 2023-09-27 DIAGNOSIS — K760 Fatty (change of) liver, not elsewhere classified: Secondary | ICD-10-CM

## 2023-09-27 DIAGNOSIS — R109 Unspecified abdominal pain: Secondary | ICD-10-CM | POA: Diagnosis not present

## 2023-09-27 DIAGNOSIS — Z860101 Personal history of adenomatous and serrated colon polyps: Secondary | ICD-10-CM

## 2023-09-27 DIAGNOSIS — K5904 Chronic idiopathic constipation: Secondary | ICD-10-CM

## 2023-09-27 DIAGNOSIS — K567 Ileus, unspecified: Secondary | ICD-10-CM | POA: Diagnosis not present

## 2023-09-27 DIAGNOSIS — K5900 Constipation, unspecified: Secondary | ICD-10-CM

## 2023-09-27 DIAGNOSIS — Z87891 Personal history of nicotine dependence: Secondary | ICD-10-CM | POA: Diagnosis not present

## 2023-09-27 DIAGNOSIS — I2510 Atherosclerotic heart disease of native coronary artery without angina pectoris: Secondary | ICD-10-CM

## 2023-09-27 NOTE — Patient Instructions (Addendum)
 Your provider has requested that you have an abdominal x ray before leaving today. Please go to the basement floor to our Radiology department for the test.   No aleve, ibuprofen , goody powders, as these are antiinflammatories and can cause inflammation in your stomach, increase bleeding risk and cause ulcers.  You can talk with PCP about alternative pain options.  Can do tyelnol max 3000 mg a day, salon pas patches are over the counter and voltern gel is topical antiinflammatory that is safe.   Miralax  is an osmotic laxative.  It only brings more water into the stool.  This is safe to take daily.  Can take up to 17 gram of miralax  twice a day.  Mix with juice or coffee.  Start 1 capful at night for 3-4 days and reassess your response in 3-4 days.  You can increase and decrease the dose based on your response.  Remember, it can take up to 3-4 days to take effect OR for the effects to wear off.   I often pair this with benefiber in the morning to help assure the stool is not too loose.   Toileting tips to help with your constipation - Drink at least 64-80 ounces of water/liquid per day. - Establish a time to try to move your bowels every day.  For many people, this is after a cup of coffee or after a meal such as breakfast. - Sit all of the way back on the toilet keeping your back fairly straight and while sitting up, try to rest the tops of your forearms on your upper thighs.   - Raising your feet with a step stool/squatty potty can be helpful to improve the angle that allows your stool to pass through the rectum. - Relax the rectum feeling it bulge toward the toilet water.  If you feel your rectum raising toward your body, you are contracting rather than relaxing. - Breathe in and slowly exhale. "Belly breath" by expanding your belly towards your belly button. Keep belly expanded as you gently direct pressure down and back to the anus.  A low pitched GRRR sound can assist with increasing  intra-abdominal pressure.  (Can also trying to blow on a pinwheel and make it move, this helps with the same belly breathing) - Repeat 3-4 times. If unsuccessful, contract the pelvic floor to restore normal tone and get off the toilet.  Avoid excessive straining. - To reduce excessive wiping by teaching your anus to normally contract, place hands on outer aspect of knees and resist knee movement outward.  Hold 5-10 second then place hands just inside of knees and resist inward movement of knees.  Hold 5 seconds.  Repeat a few times each way.  Go to the ER if unable to pass gas, severe AB pain, unable to hold down food, any shortness of breath of chest pain.  Follow up as needed  Due to recent changes in healthcare laws, you may see the results of your imaging and laboratory studies on MyChart before your provider has had a chance to review them.  We understand that in some cases there may be results that are confusing or concerning to you. Not all laboratory results come back in the same time frame and the provider may be waiting for multiple results in order to interpret others.  Please give us  48 hours in order for your provider to thoroughly review all the results before contacting the office for clarification of your results.    I  appreciate the  opportunity to care for you  Thank You   [facility]

## 2023-09-27 NOTE — Progress Notes (Signed)
 09/27/2023 Richard Roach [id number] 1955/10/22  Referring provider: Roslyn Coombe, MD Primary GI doctor: Dr. Karene Oto ( Dr. Sandrea Cruel)  ASSESSMENT AND PLAN:  Right flank pain x 4 month, CAT scan 04/23 in ER showed Ileus and constipation, he was passing gas/having BM Flank pain can be worse with laying on it, worse with movement 07/25/2020 CT abdomen pelvis with contrast for abdominal pain fat-containing left inguinal hernia small fat-containing umbilical hernia 09/01/2023 CT abdomen pelvis with contrast in the ER at [facility] for right sided abdominal pain radiating to flank air-fluid levels suggesting generalized ileus and constipation CBC and kidney liver unremarkable, normal lipase No history of surgery, no new medications - possible MSK versus constipation - try salon pas patches at night, check with PCP/ortho for radicular pain - get KUB - Increase fiber/ water intake, decrease caffeine, increase activity level. -Will add on Miralax  daily  Personal history of serrated adenomatous polyps 09/13/2017 colonoscopy excellent prep with Dr. Sandrea Cruel internal hemorrhoids grade 1 otherwise unremarkable recall 5 years New guidelines patient was scheduled for 09/2027  Hepatic steatosis/MALFLD    Latest Ref Rng & Units 10/29/2022    9:06 AM 09/22/2021    8:56 AM 09/13/2020    7:49 AM  Hepatic Function  Total Protein 6.0 - 8.3 g/dL 7.5  6.9  6.7   Albumin 3.5 - 5.2 g/dL 4.4  4.4  4.1   AST 0 - 37 U/L 20  18  17    ALT 0 - 53 U/L 23  21  19    Alk Phosphatase 39 - 117 U/L 59  60  58   Total Bilirubin 0.2 - 1.2 mg/dL 0.7  0.8  0.9   Bilirubin, Direct 0.0 - 0.3 mg/dL 0.2  0.2  0.2   Platelets 215.0  Follows with digestive health services at [facility] last seen 02/06/2022 08/20/2023 FibroScan median stiffness 5.7K PA  CAD 09/2021 echo 60 to 65% unremarkable valves 10/14/2021 cardiac perfusion study low risk for ischemia  Patient Care Team: Roslyn Coombe, MD as PCP - General (Internal Medicine) O'Neal,  Cathay Clonts, MD as PCP - Cardiology (Cardiology)  HISTORY OF PRESENT ILLNESS: 68 y.o. male with a past medical history listed below presents for evaluation of right flank/AB pain.   Discussed the use of AI scribe software for clinical note transcription with the patient, who gave verbal consent to proceed.  History of Present Illness   Richard Roach is a 68 year old male who presents with abdominal pain and constipation.  He experiences abdominal pain that worsens with movement, particularly noticeable at night when turning over in bed. The pain is located around the periumbilical area and sometimes extends to the sides. It is not triggered by eating or bowel movements but is felt during physical movement. No nausea, vomiting, heartburn, changes in stool color, diarrhea, or rectal bleeding.  He visited the emergency room in [place] about three weeks ago due to this pain, where a CT scan showed air-fluid levels, and he was diagnosed with ileus and constipation. He reports having one bowel movement per day, which he describes as regular and formed. He acknowledges that he may not be drinking enough water and has been quite active, recently involved in building a beach house and lifting heavy items, which could contribute to his symptoms. He also notes experiencing increased gas lately.  He has a history of fatty liver disease and follows up with digestive health at [facility]. He recently had a FibroScan which showed good results,  and his liver function tests have been normal. His lifestyle includes entertaining with some alcohol consumption, but he does not believe it is excessive. He denies any recent changes in medication or supplements, although he regularly takes vitamin E, C, and a multivitamin. No new symptoms such as rash or changes in bowel habits aside from the abdominal discomfort, though he does report occasional back pain.      He  reports that he quit smoking about 25 years ago. His smoking  use included cigarettes. He has never used smokeless tobacco. He reports current alcohol use of about 14.0 standard drinks of alcohol per week. He reports that he does not use drugs.  RELEVANT GI HISTORY, IMAGING AND LABS: Results   LABS Electrolytes: Normal (09/01/2023) CBC: Normal (09/01/2023) Lipase: Normal (09/01/2023)  RADIOLOGY CT abdomen, pelvis: Air fluid levels, generalized ileus, constipation (09/01/2023)  DIAGNOSTIC Colonoscopy: No polyps, hemorrhoids present (2019) FibroScan: Normal      CBC    Component Value Date/Time   WBC 5.9 10/29/2022 0906   RBC 5.28 10/29/2022 0906   HGB 16.1 10/29/2022 0906   HGB 16.6 05/22/2019 0811   HCT 48.5 10/29/2022 0906   HCT 48.2 05/22/2019 0811   PLT 215.0 10/29/2022 0906   PLT 205 05/22/2019 0811   MCV 91.8 10/29/2022 0906   MCV 89 05/22/2019 0811   MCH 30.6 05/22/2019 0811   MCH 31.3 04/18/2017 1534   MCHC 33.2 10/29/2022 0906   RDW 13.5 10/29/2022 0906   RDW 12.5 05/22/2019 0811   LYMPHSABS 1.5 10/29/2022 0906   LYMPHSABS 1.5 05/22/2019 0811   MONOABS 0.5 10/29/2022 0906   EOSABS 0.2 10/29/2022 0906   EOSABS 0.2 05/22/2019 0811   BASOSABS 0.0 10/29/2022 0906   BASOSABS 0.0 05/22/2019 0811   Recent Labs    10/29/22 0906  HGB 16.1    CMP     Component Value Date/Time   NA 139 10/29/2022 0906   NA 140 05/22/2019 0811   K 4.2 10/29/2022 0906   CL 103 10/29/2022 0906   CO2 26 10/29/2022 0906   GLUCOSE 104 (H) 10/29/2022 0906   BUN 23 10/29/2022 0906   BUN 15 05/22/2019 0811   CREATININE 1.16 10/29/2022 0906   CREATININE 1.03 12/07/2014 1342   CALCIUM  9.5 10/29/2022 0906   PROT 7.5 10/29/2022 0906   PROT 6.9 05/22/2019 0811   ALBUMIN 4.4 10/29/2022 0906   ALBUMIN 4.4 05/22/2019 0811   AST 20 10/29/2022 0906   ALT 23 10/29/2022 0906   ALKPHOS 59 10/29/2022 0906   BILITOT 0.7 10/29/2022 0906   BILITOT 0.7 05/22/2019 0811   GFRNONAA 73 05/22/2019 0811   GFRAA 85 05/22/2019 0811      Latest Ref Rng &  Units 10/29/2022    9:06 AM 09/22/2021    8:56 AM 09/13/2020    7:49 AM  Hepatic Function  Total Protein 6.0 - 8.3 g/dL 7.5  6.9  6.7   Albumin 3.5 - 5.2 g/dL 4.4  4.4  4.1   AST 0 - 37 U/L 20  18  17    ALT 0 - 53 U/L 23  21  19    Alk Phosphatase 39 - 117 U/L 59  60  58   Total Bilirubin 0.2 - 1.2 mg/dL 0.7  0.8  0.9   Bilirubin, Direct 0.0 - 0.3 mg/dL 0.2  0.2  0.2       Current Medications:    Current Outpatient Medications (Cardiovascular):    Evolocumab  (REPATHA  SURECLICK) 140 MG/ML  SOAJ, INJECT 140 MG UNDER THE SKIN EVERY TWO WEEKS INTO THE THIGH, STOMACH, OR UPPER ARM   metoprolol  succinate (TOPROL -XL) 25 MG 24 hr tablet, Take 1 tablet (25 mg total) by mouth at bedtime.   rosuvastatin  (CRESTOR ) 40 MG tablet, TAKE ONE TABLET BY MOUTH ONE TIME DAILY (Patient taking differently: Take 40 mg by mouth daily.)   ezetimibe  (ZETIA ) 10 MG tablet, TAKE ONE TABLET BY MOUTH ONE TIME DAILY (Patient not taking: Reported on 09/27/2023)   sildenafil  (VIAGRA ) 100 MG tablet, 1 tab by mouth one daily as needed (Patient taking differently: Place into feeding tube. 1 tab by mouth one daily as needed)   tadalafil  (CIALIS ) 5 MG tablet, Take 1 tablet (5 mg total) by mouth daily. (Patient not taking: Reported on 09/27/2023)   Current Outpatient Medications (Analgesics):    aspirin  EC 81 MG tablet, Take 1 tablet (81 mg total) by mouth daily. (Patient taking differently: Take 81 mg by mouth daily.)   Current Outpatient Medications (Other):    amoxicillin -clavulanate (AUGMENTIN ) 875-125 MG tablet, Take 1 tablet by mouth every 12 (twelve) hours.   Ascorbic Acid (VITAMIN C) 1000 MG tablet, Take 1,000 mg by mouth daily. (Patient not taking: Reported on 09/27/2023)   Multiple Vitamin (MULTIVITAMIN) capsule, Take 1 capsule by mouth daily.  Medical History:  Past Medical History:  Diagnosis Date   Allergic rhinitis    Anxiety state 05/02/2015   Chronic RLQ pain 05/02/2015   Colon polyps    Coronary artery  disease    Fatty liver    GERD (gastroesophageal reflux disease)    Hypercholesterolemia    Inguinal hernia    Internal hemorrhoids    Liver fibrosis 05/02/2015   PVC's (premature ventricular contractions)    Serrated adenoma of colon    Sinus bradycardia    Allergies: No Known Allergies   Surgical History:  He  has a past surgical history that includes Knee arthroscopy (Right, June 2011); Tonsillectomy; and Knee arthroscopy (Left, 2012). Family History:  His family history includes Asthma in his father; High blood pressure in his mother.  REVIEW OF SYSTEMS  : All other systems reviewed and negative except where noted in the History of Present Illness.  PHYSICAL EXAM: BP 122/70   Pulse 86   Ht 5\' 10"  (1.778 m)   Wt 184 lb (83.5 kg)   BMI 26.40 kg/m  Physical Exam   GENERAL APPEARANCE: Well nourished, in no apparent distress. HEENT: No cervical lymphadenopathy, unremarkable thyroid , sclerae anicteric, conjunctiva pink. RESPIRATORY: Respiratory effort normal, breath sounds equal bilaterally without rales, rhonchi, or wheezing. CARDIO: Regular rate and rhythm with no murmurs, rubs, or gallops, peripheral pulses intact. ABDOMEN: Soft, non-distended, active bowel sounds in all four quadrants, no tenderness to palpation, no rebound, no mass appreciated. RECTAL: Declines. MUSCULOSKELETAL: Full range of motion, normal gait, without edema. FROM right hip without pain.  SKIN: Dry, intact without rashes or lesions. No jaundice. NEURO: Alert, oriented, no focal deficits. PSYCH: Cooperative, normal mood and affect.      Edmonia Gottron, PA-C 8:55 AM

## 2023-09-30 ENCOUNTER — Ambulatory Visit: Payer: Self-pay | Admitting: Physician Assistant

## 2023-09-30 NOTE — Progress Notes (Signed)
 Agree with the assessment and plan as outlined by Quentin Mulling, PA-C. ? ?Keron Neenan, DO, FACG ? ?

## 2023-10-07 ENCOUNTER — Other Ambulatory Visit (HOSPITAL_COMMUNITY): Payer: Self-pay

## 2023-10-07 ENCOUNTER — Telehealth: Payer: Self-pay

## 2023-10-07 ENCOUNTER — Telehealth: Payer: Self-pay | Admitting: Pharmacy Technician

## 2023-10-07 NOTE — Telephone Encounter (Signed)
 Pharmacy Patient Advocate Encounter   Received notification from Pt Calls Messages that prior authorization for Repatha  is required/requested.   Insurance verification completed.   The patient is insured through [facility] ADVANTAGE/RX ADVANCE .   Per test claim: Refill too soon. PA is not needed at this time. Medication was filled 10/07/23. Next eligible fill date is 11/19/23.  Will time out to try to renew PA later in June

## 2023-10-07 NOTE — Telephone Encounter (Signed)
 Patient identification verified by 2 forms. Sims Duck, RN     Called and spoke to patient.   Informed patient I received his approval letter for repatha  in mail. Letter states patient will need new prescription sent in on 12/07/2023 when his coverage ends. Patient will also need our office to re-submit a request for continued coverage through ([employer]) or by sending a fax to [employer] at [phone] by November 07, 2023 (30 days before coverage ends).   Patient verbalized understanding and agrees with plan, no questions at this time

## 2023-10-13 DIAGNOSIS — H52201 Unspecified astigmatism, right eye: Secondary | ICD-10-CM | POA: Diagnosis not present

## 2023-10-13 DIAGNOSIS — H5203 Hypermetropia, bilateral: Secondary | ICD-10-CM | POA: Diagnosis not present

## 2023-10-13 DIAGNOSIS — H43393 Other vitreous opacities, bilateral: Secondary | ICD-10-CM | POA: Diagnosis not present

## 2023-10-13 DIAGNOSIS — H2513 Age-related nuclear cataract, bilateral: Secondary | ICD-10-CM | POA: Diagnosis not present

## 2023-10-13 DIAGNOSIS — H524 Presbyopia: Secondary | ICD-10-CM | POA: Diagnosis not present

## 2023-10-13 DIAGNOSIS — H43813 Vitreous degeneration, bilateral: Secondary | ICD-10-CM | POA: Diagnosis not present

## 2023-10-13 DIAGNOSIS — H04123 Dry eye syndrome of bilateral lacrimal glands: Secondary | ICD-10-CM | POA: Diagnosis not present

## 2023-10-14 ENCOUNTER — Telehealth: Payer: Self-pay | Admitting: Pharmacy Technician

## 2023-10-14 ENCOUNTER — Other Ambulatory Visit (HOSPITAL_COMMUNITY): Payer: Self-pay

## 2023-10-14 NOTE — Telephone Encounter (Signed)
 Pharmacy Patient Advocate Encounter  Received notification from [employer] that Prior Authorization for Repatha  has been APPROVED from 10/14/23 to 10/13/24   PA #/Case ID/Reference #: [id number]

## 2023-10-14 NOTE — Telephone Encounter (Signed)
 Pharmacy Patient Advocate Encounter   Received notification from Physician's Office that prior authorization for REpatha  renewal is required/requested.   Insurance verification completed.   The patient is insured through [facility] ADVANTAGE/RX ADVANCE .   Per test claim: PA required; PA submitted to above mentioned insurance via Fax Key/confirmation #/EOC faxed Status is pending

## 2023-11-01 ENCOUNTER — Encounter: Payer: Medicare HMO | Admitting: Internal Medicine

## 2023-11-03 ENCOUNTER — Ambulatory Visit: Payer: Medicare HMO | Admitting: Cardiovascular Disease

## 2023-11-05 ENCOUNTER — Other Ambulatory Visit: Payer: Self-pay | Admitting: Cardiovascular Disease

## 2023-11-05 DIAGNOSIS — S61244A Puncture wound with foreign body of right ring finger without damage to nail, initial encounter: Secondary | ICD-10-CM | POA: Diagnosis not present

## 2023-11-05 DIAGNOSIS — Z23 Encounter for immunization: Secondary | ICD-10-CM | POA: Diagnosis not present

## 2023-11-11 ENCOUNTER — Encounter: Admitting: Internal Medicine

## 2023-11-17 ENCOUNTER — Ambulatory Visit: Payer: Self-pay | Admitting: Internal Medicine

## 2023-11-17 ENCOUNTER — Ambulatory Visit (INDEPENDENT_AMBULATORY_CARE_PROVIDER_SITE_OTHER): Admitting: Internal Medicine

## 2023-11-17 ENCOUNTER — Encounter: Payer: Self-pay | Admitting: Internal Medicine

## 2023-11-17 VITALS — BP 128/72 | HR 56 | Temp 97.7°F | Ht 70.0 in | Wt 176.8 lb

## 2023-11-17 DIAGNOSIS — Z0001 Encounter for general adult medical examination with abnormal findings: Secondary | ICD-10-CM

## 2023-11-17 DIAGNOSIS — I2583 Coronary atherosclerosis due to lipid rich plaque: Secondary | ICD-10-CM | POA: Diagnosis not present

## 2023-11-17 DIAGNOSIS — I2510 Atherosclerotic heart disease of native coronary artery without angina pectoris: Secondary | ICD-10-CM | POA: Diagnosis not present

## 2023-11-17 DIAGNOSIS — I10 Essential (primary) hypertension: Secondary | ICD-10-CM

## 2023-11-17 DIAGNOSIS — E538 Deficiency of other specified B group vitamins: Secondary | ICD-10-CM | POA: Diagnosis not present

## 2023-11-17 DIAGNOSIS — Z125 Encounter for screening for malignant neoplasm of prostate: Secondary | ICD-10-CM

## 2023-11-17 DIAGNOSIS — Z860101 Personal history of adenomatous and serrated colon polyps: Secondary | ICD-10-CM | POA: Diagnosis not present

## 2023-11-17 DIAGNOSIS — R739 Hyperglycemia, unspecified: Secondary | ICD-10-CM | POA: Diagnosis not present

## 2023-11-17 DIAGNOSIS — E559 Vitamin D deficiency, unspecified: Secondary | ICD-10-CM

## 2023-11-17 DIAGNOSIS — E7800 Pure hypercholesterolemia, unspecified: Secondary | ICD-10-CM | POA: Diagnosis not present

## 2023-11-17 LAB — CBC WITH DIFFERENTIAL/PLATELET
Basophils Absolute: 0 K/uL (ref 0.0–0.1)
Basophils Relative: 0.4 % (ref 0.0–3.0)
Eosinophils Absolute: 0.1 K/uL (ref 0.0–0.7)
Eosinophils Relative: 1.6 % (ref 0.0–5.0)
HCT: 45.7 % (ref 39.0–52.0)
Hemoglobin: 15.3 g/dL (ref 13.0–17.0)
Lymphocytes Relative: 20.9 % (ref 12.0–46.0)
Lymphs Abs: 1.1 K/uL (ref 0.7–4.0)
MCHC: 33.5 g/dL (ref 30.0–36.0)
MCV: 91.4 fl (ref 78.0–100.0)
Monocytes Absolute: 0.4 K/uL (ref 0.1–1.0)
Monocytes Relative: 7.8 % (ref 3.0–12.0)
Neutro Abs: 3.5 K/uL (ref 1.4–7.7)
Neutrophils Relative %: 69.3 % (ref 43.0–77.0)
Platelets: 217 K/uL (ref 150.0–400.0)
RBC: 5 Mil/uL (ref 4.22–5.81)
RDW: 13.6 % (ref 11.5–15.5)
WBC: 5.1 K/uL (ref 4.0–10.5)

## 2023-11-17 LAB — URINALYSIS, ROUTINE W REFLEX MICROSCOPIC
Bilirubin Urine: NEGATIVE
Hgb urine dipstick: NEGATIVE
Ketones, ur: NEGATIVE
Leukocytes,Ua: NEGATIVE
Nitrite: NEGATIVE
RBC / HPF: NONE SEEN (ref 0–?)
Specific Gravity, Urine: 1.015 (ref 1.000–1.030)
Total Protein, Urine: NEGATIVE
Urine Glucose: NEGATIVE
Urobilinogen, UA: 0.2 (ref 0.0–1.0)
WBC, UA: NONE SEEN (ref 0–?)
pH: 7.5 (ref 5.0–8.0)

## 2023-11-17 LAB — HEMOGLOBIN A1C: Hgb A1c MFr Bld: 5.9 % (ref 4.6–6.5)

## 2023-11-17 LAB — LIPID PANEL
Cholesterol: 112 mg/dL (ref 0–200)
HDL: 69.4 mg/dL (ref >39.00)
LDL Cholesterol: 34 mg/dL (ref 0–99)
NonHDL: 42.36
Total CHOL/HDL Ratio: 2
Triglycerides: 41 mg/dL (ref 0.0–149.0)
VLDL: 8.2 mg/dL (ref 0.0–40.0)

## 2023-11-17 LAB — HEPATIC FUNCTION PANEL
ALT: 18 U/L (ref 0–53)
AST: 17 U/L (ref 0–37)
Albumin: 4.2 g/dL (ref 3.5–5.2)
Alkaline Phosphatase: 57 U/L (ref 39–117)
Bilirubin, Direct: 0.2 mg/dL (ref 0.0–0.3)
Total Bilirubin: 0.7 mg/dL (ref 0.2–1.2)
Total Protein: 6.5 g/dL (ref 6.0–8.3)

## 2023-11-17 LAB — BASIC METABOLIC PANEL WITH GFR
BUN: 16 mg/dL (ref 6–23)
CO2: 30 meq/L (ref 19–32)
Calcium: 9.1 mg/dL (ref 8.4–10.5)
Chloride: 105 meq/L (ref 96–112)
Creatinine, Ser: 0.95 mg/dL (ref 0.40–1.50)
GFR: 83.34 mL/min (ref >60.00)
Glucose, Bld: 94 mg/dL (ref 70–99)
Potassium: 4.5 meq/L (ref 3.5–5.1)
Sodium: 139 meq/L (ref 135–145)

## 2023-11-17 LAB — VITAMIN B12: Vitamin B-12: 474 pg/mL (ref 211–911)

## 2023-11-17 LAB — VITAMIN D 25 HYDROXY (VIT D DEFICIENCY, FRACTURES): VITD: 43.17 ng/mL (ref 30.00–100.00)

## 2023-11-17 LAB — PSA: PSA: 2.35 ng/mL (ref 0.10–4.00)

## 2023-11-17 LAB — TSH: TSH: 1.33 u[IU]/mL (ref 0.35–5.50)

## 2023-11-17 NOTE — Patient Instructions (Signed)
 Please continue all other medications as before, and refills have been done if requested.  Please have the pharmacy call with any other refills you may need.  Please continue your efforts at being more active, low cholesterol diet, and weight control.  You are otherwise up to date with prevention measures today.  Please keep your appointments with your specialists as you may have planned  You will be contacted regarding the referral for: Cardiac CT score, and colonoscopy  Please go to the LAB at the blood drawing area for the tests to be done  You will be contacted by phone if any changes need to be made immediately.  Otherwise, you will receive a letter about your results with an explanation, but please check with MyChart first.  Please make an Appointment to return for your 1 year visit, or sooner if needed

## 2023-11-17 NOTE — Progress Notes (Unsigned)
 Patient ID: Richard Roach, male   DOB: 04/21/1956, 68 y.o.   MRN: [id number]         Chief Complaint:: wellness exam and Annual Exam (Annual. Recent diagnosis of ileus FYI. Patient is fasting)  , hx of adenomatous polyp, hld, htn, hypergycemia, low vit d       HPI:  Richard Roach is a 68 y.o. male here for wellness exam, due for colonoscopy, declines hep B vax, o/w up to date                        Also has c/o of improved but still mild intermittent RLQ right sided abd pain, seen at [facility] ED with CT c/w ileus o/w no significant abnormal.  Denies worsening reflux, abd pain, dysphagia, n/v, bowel change or blood.  Pt denies chest pain, increased sob or doe, wheezing, orthopnea, PND, increased LE swelling, palpitations, dizziness or syncope.   Pt denies polydipsia, polyuria, or new focal neuro s/s.      Wt Readings from Last 3 Encounters:  11/17/23 176 lb 12.8 oz (80.2 kg)  09/27/23 184 lb (83.5 kg)  03/17/23 184 lb (83.5 kg)   BP Readings from Last 3 Encounters:  11/17/23 128/72  09/27/23 122/70  07/27/23 (!) 160/81   Immunization History  Administered Date(s) Administered   DTP 04/10/1998   Fluad Quad(high Dose 65+) 02/18/2023   Hep A / Hep B 11/01/2014, 04/10/2015   Hepatitis B, ADULT 11/22/2014   Hepatitis B, PED/ADOLESCENT 11/22/2014   Influenza Split 03/11/2013   Influenza Whole 05/11/2009, 03/11/2010, 01/10/2011, 03/11/2012   Influenza,inj,Quad PF,6+ Mos 01/19/2014, 04/07/2017, 02/20/2018, 01/24/2019, 02/22/2020   Influenza-Unspecified 02/09/2015   PFIZER(Purple Top)SARS-COV-2 Vaccination 06/01/2019, 06/22/2019, 03/26/2020   PNEUMOCOCCAL CONJUGATE-20 10/29/2022   Pneumococcal Polysaccharide-23 05/12/2003   Rsv, Mab, [place], 0.5 Ml, Neonate To 24 Mos(Beyfortus) 02/18/2023   Td 09/23/2007   Tdap 05/06/2013, 07/11/2020   Zoster Recombinant(Shingrix ) 12/11/2019, 02/09/2020, 09/22/2021   Health Maintenance Due  Topic Date Due   Hepatitis B Vaccines (3 of 3 - 19+ 3-dose  series) 06/05/2015      Past Medical History:  Diagnosis Date   Allergic rhinitis    Anxiety state 05/02/2015   Chronic RLQ pain 05/02/2015   Colon polyps    Coronary artery disease    Fatty liver    GERD (gastroesophageal reflux disease)    Hypercholesterolemia    Inguinal hernia    Internal hemorrhoids    Liver fibrosis 05/02/2015   PVC's (premature ventricular contractions)    Serrated adenoma of colon    Sinus bradycardia    Past Surgical History:  Procedure Laterality Date   KNEE ARTHROSCOPY Right June 2011   KNEE ARTHROSCOPY Left 2012   TONSILLECTOMY      reports that he quit smoking about 25 years ago. His smoking use included cigarettes. He has never used smokeless tobacco. He reports current alcohol use of about 14.0 standard drinks of alcohol per week. He reports that he does not use drugs. family history includes Asthma in his father; High blood pressure in his mother. No Known Allergies Current Outpatient Medications on File Prior to Visit  Medication Sig Dispense Refill   Ascorbic Acid (VITAMIN C) 1000 MG tablet Take 1,000 mg by mouth daily.     aspirin  EC 81 MG tablet Take 1 tablet (81 mg total) by mouth daily. 90 tablet 3   Evolocumab  (REPATHA  SURECLICK) 140 MG/ML SOAJ INJECT 140 MG UNDER THE SKIN EVERY TWO WEEKS INTO  THE THIGH, STOMACH, OR UPPER ARM 2 mL 5   metoprolol  succinate (TOPROL -XL) 25 MG 24 hr tablet Take 1 tablet (25 mg total) by mouth at bedtime. 90 tablet 2   Multiple Vitamin (MULTIVITAMIN) capsule Take 1 capsule by mouth daily.     rosuvastatin  (CRESTOR ) 40 MG tablet TAKE ONE TABLET BY MOUTH ONE TIME DAILY 90 tablet 3   sildenafil  (VIAGRA ) 100 MG tablet 1 tab by mouth one daily as needed (Patient taking differently: Place into feeding tube. 1 tab by mouth one daily as needed) 5 tablet 11   amoxicillin -clavulanate (AUGMENTIN ) 875-125 MG tablet Take 1 tablet by mouth every 12 (twelve) hours. 14 tablet 0   ezetimibe  (ZETIA ) 10 MG tablet TAKE ONE  TABLET BY MOUTH ONE TIME DAILY (Patient not taking: Reported on 11/17/2023) 90 tablet 3   tadalafil  (CIALIS ) 5 MG tablet Take 1 tablet (5 mg total) by mouth daily. (Patient not taking: Reported on 11/17/2023) 90 tablet 3   No current facility-administered medications on file prior to visit.        ROS:  All others reviewed and negative.  Objective        PE:  BP 128/72   Pulse (!) 56   Temp 97.7 F (36.5 C)   Ht 5' 10 (1.778 m)   Wt 176 lb 12.8 oz (80.2 kg)   SpO2 99%   BMI 25.37 kg/m                 Constitutional: Pt appears in NAD               HENT: Head: NCAT.                Right Ear: External ear normal.                 Left Ear: External ear normal.                Eyes: . Pupils are equal, round, and reactive to light. Conjunctivae and EOM are normal               Nose: without d/c or deformity               Neck: Neck supple. Gross normal ROM               Cardiovascular: Normal rate and regular rhythm.                 Pulmonary/Chest: Effort normal and breath sounds without rales or wheezing.                Abd:  Soft, NT, ND, + BS, no organomegaly               Neurological: Pt is alert. At baseline orientation, motor grossly intact               Skin: Skin is warm. No rashes, no other new lesions, LE edema - none               Psychiatric: Pt behavior is normal without agitation   Micro: none  Cardiac tracings I have personally interpreted today:  none  Pertinent Radiological findings (summarize): none   Lab Results  Component Value Date   WBC 5.1 11/17/2023   HGB 15.3 11/17/2023   HCT 45.7 11/17/2023   PLT 217.0 11/17/2023   GLUCOSE 94 11/17/2023   CHOL 112 11/17/2023   TRIG 41.0 11/17/2023   HDL 69.40 11/17/2023  LDLDIRECT 164.0 04/18/2012   LDLCALC 34 11/17/2023   ALT 18 11/17/2023   AST 17 11/17/2023   NA 139 11/17/2023   K 4.5 11/17/2023   CL 105 11/17/2023   CREATININE 0.95 11/17/2023   BUN 16 11/17/2023   CO2 30 11/17/2023   TSH 1.33  11/17/2023   PSA 2.35 11/17/2023   HGBA1C 5.9 11/17/2023   Assessment/Plan:  Richard Roach is a 68 y.o. White or Caucasian [1] male with  has a past medical history of Allergic rhinitis, Anxiety state (05/02/2015), Chronic RLQ pain (05/02/2015), Colon polyps, Coronary artery disease, Fatty liver, GERD (gastroesophageal reflux disease), Hypercholesterolemia, Inguinal hernia, Internal hemorrhoids, Liver fibrosis (05/02/2015), PVC's (premature ventricular contractions), Serrated adenoma of colon, and Sinus bradycardia.  Encounter for well adult exam with abnormal findings Age and sex appropriate education and counseling updated with regular exercise and diet Referrals for preventative services - for colonoscopy with hx of adenomatous polyp Immunizations addressed - none needed Smoking counseling  - none needed Evidence for depression or other mood disorder - none significant Most recent labs reviewed. I have personally reviewed and have noted: 1) the patient's medical and social history 2) The patient's current medications and supplements 3) The patient's height, weight, and BMI have been recorded in the chart   Coronary artery calcification seen on CT scan Also for f/u cardiac cT score  Vitamin D  deficiency Last vitamin D  Lab Results  Component Value Date   VD25OH 43.17 11/17/2023   Stable, cont oral replacement   Hyperglycemia Lab Results  Component Value Date   HGBA1C 5.9 11/17/2023   Stable, pt to continue current medical treatment  - diet, wt control   Hypercholesterolemia Lab Results  Component Value Date   LDLCALC 34 11/17/2023   Stable, pt to continue current statin repatha  140 mg, crestor  40 mg every day, zetia  10 every day; also for lipoprotein A with labs   Essential hypertension BP Readings from Last 3 Encounters:  11/17/23 128/72  09/27/23 122/70  07/27/23 (!) 160/81   Stable, pt to continue medical treatment toprol  xl 25 qd   History of adenomatous polyp  of colon Also due for colonscopy , previous GI Dr Aneita now retired, will need new GI  Followup: Return in about 1 year (around 11/16/2024).  Richard Rush, MD 11/18/2023 7:07 PM [facility] [place] [facility] Internal Medicine

## 2023-11-18 ENCOUNTER — Encounter: Payer: Self-pay | Admitting: Internal Medicine

## 2023-11-18 NOTE — Assessment & Plan Note (Signed)
 Age and sex appropriate education and counseling updated with regular exercise and diet Referrals for preventative services - for colonoscopy with hx of adenomatous polyp Immunizations addressed - none needed Smoking counseling  - none needed Evidence for depression or other mood disorder - none significant Most recent labs reviewed. I have personally reviewed and have noted: 1) the patient's medical and social history 2) The patient's current medications and supplements 3) The patient's height, weight, and BMI have been recorded in the chart

## 2023-11-18 NOTE — Assessment & Plan Note (Signed)
 Also due for colonscopy , previous GI Dr Aneita now retired, will need new GI

## 2023-11-18 NOTE — Assessment & Plan Note (Signed)
 Last vitamin D  Lab Results  Component Value Date   VD25OH 43.17 11/17/2023   Stable, cont oral replacement

## 2023-11-18 NOTE — Assessment & Plan Note (Signed)
 Lab Results  Component Value Date   HGBA1C 5.9 11/17/2023   Stable, pt to continue current medical treatment  - diet, wt control

## 2023-11-18 NOTE — Assessment & Plan Note (Addendum)
 Lab Results  Component Value Date   LDLCALC 34 11/17/2023   Stable, pt to continue current statin repatha  140 mg, crestor  40 mg every day, zetia  10 every day; also for lipoprotein A with labs

## 2023-11-18 NOTE — Assessment & Plan Note (Signed)
 BP Readings from Last 3 Encounters:  11/17/23 128/72  09/27/23 122/70  07/27/23 (!) 160/81   Stable, pt to continue medical treatment toprol  xl 25 qd

## 2023-11-18 NOTE — Assessment & Plan Note (Signed)
 Also for f/u cardiac cT score

## 2023-11-19 LAB — LIPOPROTEIN A (LPA): Lipoprotein (a): 253 nmol/L — ABNORMAL HIGH (ref <75)

## 2023-11-23 ENCOUNTER — Ambulatory Visit (HOSPITAL_COMMUNITY)
Admission: RE | Admit: 2023-11-23 | Discharge: 2023-11-23 | Disposition: A | Discharge status: Home / Self Care | Payer: Self-pay | Source: Ambulatory Visit | Attending: Internal Medicine | Admitting: Internal Medicine

## 2023-11-23 DIAGNOSIS — I2510 Atherosclerotic heart disease of native coronary artery without angina pectoris: Secondary | ICD-10-CM | POA: Insufficient documentation

## 2023-11-23 DIAGNOSIS — I2583 Coronary atherosclerosis due to lipid rich plaque: Secondary | ICD-10-CM | POA: Insufficient documentation

## 2023-11-23 DIAGNOSIS — E7800 Pure hypercholesterolemia, unspecified: Secondary | ICD-10-CM | POA: Insufficient documentation

## 2023-11-23 DIAGNOSIS — I10 Essential (primary) hypertension: Secondary | ICD-10-CM | POA: Insufficient documentation

## 2023-11-24 DIAGNOSIS — R351 Nocturia: Secondary | ICD-10-CM | POA: Diagnosis not present

## 2023-11-24 DIAGNOSIS — N5201 Erectile dysfunction due to arterial insufficiency: Secondary | ICD-10-CM | POA: Diagnosis not present

## 2023-11-24 DIAGNOSIS — N401 Enlarged prostate with lower urinary tract symptoms: Secondary | ICD-10-CM | POA: Diagnosis not present

## 2023-12-06 ENCOUNTER — Telehealth: Payer: Self-pay | Admitting: Physician Assistant

## 2023-12-06 NOTE — Telephone Encounter (Signed)
 Good Afternoon Dr. San, I received a call from this patient requesting to get a sooner colonoscopy scheduled due to him having history of polyps. Patient stated that his recall for colonoscopy should be every 5 years. Patient was a Dr. Aneita patient was last seen with Alan Coombs back in May of this years. Patient had his colonoscopy done May 06 th of 2019. Would you please advise on how to schedule this patient.   Thank you.

## 2023-12-07 ENCOUNTER — Encounter: Payer: Self-pay | Admitting: Gastroenterology

## 2023-12-09 ENCOUNTER — Other Ambulatory Visit: Payer: Self-pay | Admitting: Internal Medicine

## 2023-12-11 ENCOUNTER — Other Ambulatory Visit: Payer: Self-pay | Admitting: Cardiovascular Disease

## 2023-12-11 DIAGNOSIS — E785 Hyperlipidemia, unspecified: Secondary | ICD-10-CM

## 2023-12-11 DIAGNOSIS — I2510 Atherosclerotic heart disease of native coronary artery without angina pectoris: Secondary | ICD-10-CM

## 2023-12-12 NOTE — H&P (View-Only) (Signed)
 Cardiology Office Note:  .   Date:  12/16/2023  ID:  [place], [place] 09/15/1955, MRN [id number] PCP: Norleen Lynwood ORN, MD  [facility] Providers Cardiologist:  Darryle ONEIDA Decent, MD {  History of Present Illness: .    Chief Complaint  Patient presents with   Follow-up    12 months. Patient says the bottoms of his feet feel numb.    Richard [place] is a 68 y.o. male with history of CAD who presents for follow-up.    History of Present Illness   Richard [place] is a 68 year old male with elevated coronary calcium  score and elevated LP(a) who presents for follow-up. He is accompanied by his daughter.  He has a history of elevated coronary calcium  score, which has increased since his last CT scan in 2021. A PET perfusion study in 2023 was negative for ischemia. He experiences occasional fatigue and dizziness, particularly during physical activities like yard work. No significant chest pain is reported, but he mentions occasional chest tightness when bending over.  His hyperlipidemia is well controlled with Repatha  and Crestor , with a recent LDL level of 34. He has discontinued Zetia  but continues on a baby aspirin  regimen. He has an elevated LP(a) level of 253, with a family history of elevated LP(a).  He has a history of premature ventricular contractions (PVCs) with a low burden, and his most recent cardiac monitor was normal. He takes metoprolol  succinate 25 mg daily to manage this condition.  He generally feels well, though he sometimes experiences fatigue, which he attributes to insufficient sleep. He typically sleeps about seven hours per night and does not believe he has sleep apnea, as he does not snore according to his wife.  He works from home and leads an active lifestyle, although he does not engage in structured exercise regularly.          Problem List 1. CAD -CAC score 1281 (93rd percentile) -normal PET MPI 10/14/2021 -Lpa 253 2. PVCs/palpitations (Holter 2016, 0.05% PVC  burden) 3. Fatty liver  4. Hyperlipidemia  -T chol 112, HDL 69, LDL 34, TG 41 -A1c 5.8    ROS: All other ROS reviewed and negative. Pertinent positives noted in the HPI.     Studies Reviewed: SABRA   EKG Interpretation Date/Time:  Thursday December 16 2023 08:20:47 EDT Ventricular Rate:  59 PR Interval:  164 QRS Duration:  82 QT Interval:  396 QTC Calculation: 392 R Axis:   24  Text Interpretation: Sinus bradycardia Confirmed by Decent Darryle [phone]) on 12/16/2023 8:32:39 AM   Physical Exam:   VS:  BP 120/64 (BP Location: Left Arm, Patient Position: Sitting, Cuff Size: Normal)   Pulse (!) 59   Ht 5' 10 (1.778 m)   Wt 177 lb (80.3 kg)   BMI 25.40 kg/m    Wt Readings from Last 3 Encounters:  12/16/23 177 lb (80.3 kg)  11/17/23 176 lb 12.8 oz (80.2 kg)  09/27/23 184 lb (83.5 kg)    GEN: Well nourished, well developed in no acute distress NECK: No JVD; No carotid bruits CARDIAC: RRR, no murmurs, rubs, gallops RESPIRATORY:  Clear to auscultation without rales, wheezing or rhonchi  ABDOMEN: Soft, non-tender, non-distended EXTREMITIES:  No edema; No deformity  ASSESSMENT AND PLAN: .   Assessment and Plan    Coronary atherosclerosis with elevated coronary calcium  score and elevated lipoprotein(a) Fatigue (possibly anginal equivalent) Coronary atherosclerosis with calcium  score of 1281 (93rd percentile) and lipoprotein(a) at 253. Increase in calcium  score  expected due to plaque stabilization. Previous PET negative for significant obstructive disease. EKG shows sinus bradycardia, no acute ischemic changes. Discussed coronary CTA to exclude significant obstructive disease. - Order coronary CTA to evaluate for significant obstructive disease. - Continue Repatha  and Crestor . - Continue aspirin  therapy.  Hyperlipidemia, well controlled on current therapy Hyperlipidemia well controlled. LDL at 34, at goal. Current regimen includes Repatha  and Crestor . - Continue Repatha  and Crestor . -  Monitor lipid levels regularly.  Premature ventricular contractions, low burden PVCs with low burden. Recent monitor normal. Managed with metoprolol  succinate 25 mg daily. - Continue metoprolol  succinate 25 mg daily.  Fatigue, under evaluation Fatigue possibly related to insufficient sleep or hydration. No cardiac cause on EKG or PET scan. Discussed importance of hydration and sleep. - Encourage hydration with 6-8 glasses of water  daily. - Encourage regular sleep schedule with 7-8 hours per night. - CCTA as above              Follow-up: Return in about 1 year (around 12/15/2024).  Signed, Darryle DASEN. Barbaraann, MD, [facility]  [facility]  [address] [place], [place] [address] [phone]  8:44 AM

## 2023-12-12 NOTE — Progress Notes (Unsigned)
 Cardiology Office Note:  .   Date:  12/16/2023  ID:  [place], Richard Aug 02, 1955, MRN [id number] PCP: Norleen Lynwood ORN, MD   [facility] Prov[facility] Cardiologist:  Darryle ONEIDA Decent, MD {  History of Present Illness: .    Chief Complaint  Patient presents with   Follow-up    12 months. Patient says the bottoms of his feet feel numb.    Richard [place] is a 68 y.o. male with history of CAD who presents for follow-up.    History of Present Illness   Richard [place] is a 68 year old male with elevated coronary calcium  score and elevated LP(a) who presents for follow-up. He is accompanied by his daughter.  He has a history of elevated coronary calcium  score, which has increased since his last CT scan in 2021. A PET perfusion study in 2023 was negative for ischemia. He experiences occasional fatigue and dizziness, particularly during physical activities like yard work. No significant chest pain is reported, but he mentions occasional chest tightness when bending over.  His hyperlipidemia is well controlled with Repatha  and Crestor , with a recent LDL level of 34. He has discontinued Zetia  but continues on a baby aspirin  regimen. He has an elevated LP(a) level of 253, with a family history of elevated LP(a).  He has a history of premature ventricular contractions (PVCs) with a low burden, and his most recent cardiac monitor was normal. He takes metoprolol  succinate 25 mg daily to manage this condition.  He generally feels well, though he sometimes experiences fatigue, which he attributes to insufficient sleep. He typically sleeps about seven hours per night and does not believe he has sleep apnea, as he does not snore according to his wife.  He works from home and leads an active lifestyle, although he does not engage in structured exercise regularly.          Problem List 1. CAD -CAC score 1281 (93rd percentile) -normal PET MPI 10/14/2021 -Lpa 253 2. PVCs/palpitations (Holter 2016, 0.05% PVC  burden) 3. Fatty liver  4. Hyperlipidemia  -T chol 112, HDL 69, LDL 34, TG 41 -A1c 5.8    ROS: All other ROS reviewed and negative. Pertinent positives noted in the HPI.     Studies Reviewed: SABRA   EKG Interpretation Date/Time:  Thursday December 16 2023 08:20:47 EDT Ventricular Rate:  59 PR Interval:  164 QRS Duration:  82 QT Interval:  396 QTC Calculation: 392 R Axis:   24  Text Interpretation: Sinus bradycardia Confirmed by Decent Darryle [phone]) on 12/16/2023 8:32:39 AM   Physical Exam:   VS:  BP 120/64 (BP Location: Left Arm, Patient Position: Sitting, Cuff Size: Normal)   Pulse (!) 59   Ht 5' 10 (1.778 m)   Wt 177 lb (80.3 kg)   BMI 25.40 kg/m    Wt Readings from Last 3 Encounters:  12/16/23 177 lb (80.3 kg)  11/17/23 176 lb 12.8 oz (80.2 kg)  09/27/23 184 lb (83.5 kg)    GEN: Well nourished, well developed in no acute distress NECK: No JVD; No carotid bruits CARDIAC: RRR, no murmurs, rubs, gallops RESPIRATORY:  Clear to auscultation without rales, wheezing or rhonchi  ABDOMEN: Soft, non-tender, non-distended EXTREMITIES:  No edema; No deformity  ASSESSMENT AND PLAN: .   Assessment and Plan    Coronary atherosclerosis with elevated coronary calcium  score and elevated lipoprotein(a) Fatigue (possibly anginal equivalent) Coronary atherosclerosis with calcium  score of 1281 (93rd percentile) and lipoprotein(a) at 253. Increase in calcium  score  expected due to plaque stabilization. Previous PET negative for significant obstructive disease. EKG shows sinus bradycardia, no acute ischemic changes. Discussed coronary CTA to exclude significant obstructive disease. - Order coronary CTA to evaluate for significant obstructive disease. - Continue Repatha  and Crestor . - Continue aspirin  therapy.  Hyperlipidemia, well controlled on current therapy Hyperlipidemia well controlled. LDL at 34, at goal. Current regimen includes Repatha  and Crestor . - Continue Repatha  and Crestor . -  Monitor lipid levels regularly.  Premature ventricular contractions, low burden PVCs with low burden. Recent monitor normal. Managed with metoprolol  succinate 25 mg daily. - Continue metoprolol  succinate 25 mg daily.  Fatigue, under evaluation Fatigue possibly related to insufficient sleep or hydration. No cardiac cause on EKG or PET scan. Discussed importance of hydration and sleep. - Encourage hydration with 6-8 glasses of water daily. - Encourage regular sleep schedule with 7-8 hours per night. - CCTA as above              Follow-up: Return in about 1 year (around 12/15/2024).  Signed, Darryle DASEN. Barbaraann, MD, [facility]  [facility]  [address] [place], [place] [address] [phone]  8:44 AM

## 2023-12-16 ENCOUNTER — Ambulatory Visit: Payer: Self-pay | Attending: Cardiovascular Disease | Admitting: Cardiovascular Disease

## 2023-12-16 ENCOUNTER — Encounter: Payer: Self-pay | Admitting: Cardiovascular Disease

## 2023-12-16 VITALS — BP 120/64 | HR 59 | Ht 70.0 in | Wt 177.0 lb

## 2023-12-16 DIAGNOSIS — T733XXA Exhaustion due to excessive exertion, initial encounter: Secondary | ICD-10-CM

## 2023-12-16 DIAGNOSIS — E782 Mixed hyperlipidemia: Secondary | ICD-10-CM | POA: Diagnosis not present

## 2023-12-16 DIAGNOSIS — T733XXD Exhaustion due to excessive exertion, subsequent encounter: Secondary | ICD-10-CM

## 2023-12-16 DIAGNOSIS — I2510 Atherosclerotic heart disease of native coronary artery without angina pectoris: Secondary | ICD-10-CM

## 2023-12-16 NOTE — Patient Instructions (Signed)
   Testing/Procedure    Your cardiac CT will be scheduled at    [facility] [address]  [place], [place] [address]   If scheduled at the Heart and V[facility] at [address], please enter the parking lot using the [address] entrance and use the FREE valet service at the patient drop-off area. Enter the building and check-in with registration on the main floor.   Please follow these instructions carefully (unless otherwise directed):  An IV will be required for this test and Nitroglycerin will be given.  Hold all erectile dysfunction medications at least 3 days (72 hrs) prior to test. (Ie viagra , cialis , sildenafil , tadalafil , etc)   On the Night Before the Test: Be sure to Drink plenty of water. Do not consume any caffeinated/decaffeinated beverages or chocolate 12 hours prior to your test. Do not take any antihistamines 12 hours prior to your test.   On the Day of the Test: Drink plenty of water until 1 hour prior to the test. Do not eat any food 1 hour prior to test. You may take your regular medications prior to the test.       After the Test: Drink plenty of water. After receiving IV contrast, you may experience a mild flushed feeling. This is normal. On occasion, you may experience a mild rash up to 24 hours after the test. This is not dangerous. If this occurs, you can take Benadryl  25 mg, Zyrtec, Claritin, or Allegra  and increase your fluid intake. (Patients taking Tikosyn should avoid Benadryl , and may take Zyrtec, Claritin, or Allegra ) If you experience trouble breathing, this can be serious. If it is severe call [phone] IMMEDIATELY. If it is mild, please call our office.  We will call to schedule your test 2-4 weeks out understanding that some insurance companies will need an authorization prior to the service being performed.   For more information and frequently asked questions, please visit our website :  [email or web address]  For non-scheduling related questions, please contact the cardiac imaging nurse navigator should you have any questions/concerns: Cardiac Imaging Nurse Navigators Direct Office Dial: [phone]   For scheduling needs, including cancellations and rescheduling, please call [place], [phone].   Follow-Up: At [facility], you and your health needs are our priority.  As part of our continuing mission to provide you with exceptional heart care, our providers are all part of one team.  This team includes your primary Cardiologist (physician) and Advanced Practice Providers or APPs (Physician Assistants and Nurse Practitioners) who all work together to provide you with the care you need, when you need it.  Your next appointment:   12 month(s)  Provider:   Darryle ONEIDA Decent, MD

## 2023-12-17 ENCOUNTER — Ambulatory Visit: Payer: Self-pay | Admitting: Cardiovascular Disease

## 2023-12-17 LAB — BASIC METABOLIC PANEL WITH GFR
BUN/Creatinine Ratio: 23 (ref 10–24)
BUN: 24 mg/dL (ref 8–27)
CO2: 21 mmol/L (ref 20–29)
Calcium: 9.7 mg/dL (ref 8.6–10.2)
Chloride: 102 mmol/L (ref 96–106)
Creatinine, Ser: 1.05 mg/dL (ref 0.76–1.27)
Glucose: 73 mg/dL (ref 70–99)
Potassium: 4.6 mmol/L (ref 3.5–5.2)
Sodium: 138 mmol/L (ref 134–144)
eGFR: 78 mL/min/1.73 (ref >59)

## 2023-12-28 ENCOUNTER — Encounter (HOSPITAL_COMMUNITY): Payer: Self-pay

## 2023-12-29 ENCOUNTER — Ambulatory Visit (AMBULATORY_SURGERY_CENTER): Admitting: *Deleted

## 2023-12-29 ENCOUNTER — Encounter: Payer: Self-pay | Admitting: Gastroenterology

## 2023-12-29 VITALS — Ht 70.0 in | Wt 180.0 lb

## 2023-12-29 DIAGNOSIS — Z860101 Personal history of adenomatous and serrated colon polyps: Secondary | ICD-10-CM

## 2023-12-29 MED ORDER — PEG 3350-KCL-NA BICARB-NACL 420 G PO SOLR: 4000.0000 mL | Freq: Once | ORAL | 0 refills | Status: AC

## 2023-12-29 NOTE — Progress Notes (Signed)
 Pt's name and DOB verified at the beginning of the pre-visit with 2 identifiers   Pt denies any difficulty with ambulating,sitting, laying down or rolling side to side   Pt uses ambulation assistance device or has issues with mobiiity  Pt has no issues moving head neck or swallowing  No egg or soy allergy known to patient   No issues known to pt with past sedation with any surgeries or procedures  No FH of Malignant Hyperthermia  Pt is not on home 02   Pt is not on blood thinners   Pt denies issues with constipation   Pt is not on dialysis  Pt hx of PVC's,SB as well  Pt to do a stress test 8/22   Patient's chart reviewed by Norleen Schillings CNRA prior to pre-visit and patient appropriate for the [facility].  Pre-visit completed and red dot placed by patient's name on their procedure day (on provider's schedule).    Visit by phone  Pt states weight is 180 lb  Instructions reviewed. Pt given  both [facility] main # and MD on call # prior to instructions.   Informed pt to come in at the time discussed and is shown on PV instructions. Pt informed that they are coming in i.e. cloths change.,IV placement , Consent signing and meeting CRNA. Pt states understanding after  given opportunity to ask questions t after all  instructions given.  Pt instructed to use Singlecare.com or GoodRx for a price reduction on prep  Instructed pt to review instructions again prior to procedure and call main # given if has any questions.. Pt states they will.  Instructed pt where to find PV instructions in My Chart  Instructed pt on all aspects of written instructions including med holds clothing to wear and foods to eat and not eat as well as after procedure legal restrictions and to cal MD on call if needed.. Pt states understanding.

## 2023-12-30 ENCOUNTER — Telehealth (HOSPITAL_COMMUNITY): Payer: Self-pay | Admitting: Emergency Medicine

## 2023-12-30 NOTE — Telephone Encounter (Signed)
 Reaching out to patient to offer assistance regarding upcoming cardiac imaging study; pt verbalizes understanding of appt date/time, parking situation and where to check in, pre-test NPO status and medications ordered, and verified current allergies; name and call back number provided for further questions should they arise [place] RN Navigator Cardiac Imaging [facility] [phone] office [phone] cell

## 2023-12-31 ENCOUNTER — Ambulatory Visit (HOSPITAL_COMMUNITY)
Admission: RE | Admit: 2023-12-31 | Discharge: 2023-12-31 | Disposition: A | Discharge status: Home / Self Care | Source: Ambulatory Visit | Attending: Cardiovascular Disease | Admitting: Cardiovascular Disease

## 2023-12-31 ENCOUNTER — Other Ambulatory Visit: Payer: Self-pay | Admitting: Internal Medicine

## 2023-12-31 ENCOUNTER — Ambulatory Visit (HOSPITAL_BASED_OUTPATIENT_CLINIC_OR_DEPARTMENT_OTHER)
Admission: RE | Admit: 2023-12-31 | Discharge: 2023-12-31 | Disposition: A | Discharge status: Home / Self Care | Source: Ambulatory Visit | Attending: Internal Medicine | Admitting: Internal Medicine

## 2023-12-31 ENCOUNTER — Encounter: Payer: Self-pay | Admitting: Gastroenterology

## 2023-12-31 DIAGNOSIS — I7121 Aneurysm of the ascending aorta, without rupture: Secondary | ICD-10-CM | POA: Diagnosis not present

## 2023-12-31 DIAGNOSIS — I2510 Atherosclerotic heart disease of native coronary artery without angina pectoris: Secondary | ICD-10-CM | POA: Insufficient documentation

## 2023-12-31 DIAGNOSIS — R931 Abnormal findings on diagnostic imaging of heart and coronary circulation: Secondary | ICD-10-CM

## 2023-12-31 MED ORDER — NITROGLYCERIN 0.4 MG SL SUBL
0.8000 mg | SUBLINGUAL_TABLET | Freq: Once | SUBLINGUAL | Status: AC
  Administered 2023-12-31: 0.8 mg via SUBLINGUAL

## 2023-12-31 MED ORDER — IOHEXOL 350 MG/ML SOLN
100.0000 mL | Freq: Once | INTRAVENOUS | Status: AC | PRN
  Administered 2023-12-31: 100 mL via INTRAVENOUS

## 2023-12-31 NOTE — Telephone Encounter (Signed)
 Called Richard [place].  Discussed the results of his coronary CTA.  Concerning for severe stenosis in the proximal LAD and obstructive lesion in the mid RCA.  Reports no chest pain or trouble breathing.  Well-controlled lipids and on aspirin  and metoprolol .  We discussed that he needs a cardiac catheterization to define his anatomy.  We will try to set him up for this on Tuesday of next week 01/04/2024.  I discussed the risk and benefits of the procedure.  He understands.  He is willing to proceed.  Informed Consent   Shared Decision Making/Informed Consent The risks [stroke (1 in 1000), death (1 in 1000), kidney failure [usually temporary] (1 in 500), bleeding (1 in 200), allergic reaction [possibly serious] (1 in 200)], benefits (diagnostic support and management of coronary artery disease) and alternatives of a cardiac catheterization were discussed in detail with Richard [place] and he is willing to proceed.  Signed, Darryle DASEN. Barbaraann, MD, [facility]  [facility]  [address] [place], [place] [address] [phone]  6:06 PM

## 2024-01-02 ENCOUNTER — Encounter: Payer: Self-pay | Admitting: Internal Medicine

## 2024-01-02 ENCOUNTER — Encounter: Payer: Self-pay | Admitting: Cardiovascular Disease

## 2024-01-03 ENCOUNTER — Other Ambulatory Visit: Payer: Self-pay | Admitting: *Deleted

## 2024-01-03 ENCOUNTER — Encounter: Payer: Self-pay | Admitting: *Deleted

## 2024-01-03 DIAGNOSIS — R931 Abnormal findings on diagnostic imaging of heart and coronary circulation: Secondary | ICD-10-CM | POA: Diagnosis not present

## 2024-01-03 LAB — CBC
Hematocrit: 44.6 % (ref 37.5–51.0)
Hemoglobin: 14.9 g/dL (ref 13.0–17.7)
MCH: 31.6 pg (ref 26.6–33.0)
MCHC: 33.4 g/dL (ref 31.5–35.7)
MCV: 95 fL (ref 79–97)
Platelets: 203 x10E3/uL (ref 150–450)
RBC: 4.72 x10E6/uL (ref 4.14–5.80)
RDW: 12.7 % (ref 11.6–15.4)
WBC: 5.7 x10E3/uL (ref 3.4–10.8)

## 2024-01-04 ENCOUNTER — Telehealth: Payer: Self-pay | Admitting: *Deleted

## 2024-01-04 ENCOUNTER — Ambulatory Visit: Payer: Self-pay | Admitting: Cardiovascular Disease

## 2024-01-04 NOTE — Telephone Encounter (Signed)
 Cardiac Catheterization scheduled at [facility] for: Wednesday January 05, 2024 1:30 PM Arrival time [facility] Main Entrance A at: 11:30 AM-time change per Cath Lab  Diet: -May have light meal until  7:30 AM. (6 hours before procedure time) Approved light meal consists of plain toast, fruit, light soups, crackers.  Hydration: -May drink clear liquids until 2 hours before the procedure.  Approved liquids: Water , clear tea, black coffee, fruit juices-non-citric and without pulp,Gatorade, plain Jello/popsicles.   Please drink 16 oz bottle of water  2 hours before procedure.  Medication instructions: -Usual morning medications can be taken including aspirin  81 mg.  Plan to go home the same day, you will only stay overnight if medically necessary.  You must have responsible adult to drive you home.  Someone must be with you the first 24 hours after you arrive home.  Reviewed procedure instructions with patient.

## 2024-01-05 ENCOUNTER — Encounter (HOSPITAL_COMMUNITY)
Admission: RE | Disposition: A | Discharge status: Home / Self Care | Payer: Self-pay | Source: Home / Self Care | Attending: Cardiovascular Disease

## 2024-01-05 ENCOUNTER — Ambulatory Visit (HOSPITAL_COMMUNITY)
Admission: RE | Admit: 2024-01-05 | Discharge: 2024-01-05 | Disposition: A | Discharge status: Home / Self Care | Attending: Cardiovascular Disease | Admitting: Cardiovascular Disease

## 2024-01-05 ENCOUNTER — Other Ambulatory Visit: Payer: Self-pay

## 2024-01-05 DIAGNOSIS — R5383 Other fatigue: Secondary | ICD-10-CM | POA: Diagnosis not present

## 2024-01-05 DIAGNOSIS — I2584 Coronary atherosclerosis due to calcified coronary lesion: Secondary | ICD-10-CM | POA: Insufficient documentation

## 2024-01-05 DIAGNOSIS — E785 Hyperlipidemia, unspecified: Secondary | ICD-10-CM | POA: Diagnosis not present

## 2024-01-05 DIAGNOSIS — I2510 Atherosclerotic heart disease of native coronary artery without angina pectoris: Secondary | ICD-10-CM | POA: Insufficient documentation

## 2024-01-05 DIAGNOSIS — I10 Essential (primary) hypertension: Secondary | ICD-10-CM | POA: Diagnosis not present

## 2024-01-05 DIAGNOSIS — I493 Ventricular premature depolarization: Secondary | ICD-10-CM | POA: Insufficient documentation

## 2024-01-05 DIAGNOSIS — Z7982 Long term (current) use of aspirin: Secondary | ICD-10-CM | POA: Insufficient documentation

## 2024-01-05 DIAGNOSIS — R931 Abnormal findings on diagnostic imaging of heart and coronary circulation: Secondary | ICD-10-CM

## 2024-01-05 DIAGNOSIS — Z79899 Other long term (current) drug therapy: Secondary | ICD-10-CM | POA: Diagnosis not present

## 2024-01-05 HISTORY — PX: LEFT HEART CATH AND CORONARY ANGIOGRAPHY: CATH118249

## 2024-01-05 SURGERY — LEFT HEART CATH AND CORONARY ANGIOGRAPHY
Anesthesia: LOCAL

## 2024-01-05 MED ORDER — ACETAMINOPHEN 325 MG PO TABS: 650.0000 mg | ORAL_TABLET | ORAL | Status: DC | PRN

## 2024-01-05 MED ORDER — LABETALOL HCL 5 MG/ML IV SOLN: 10.0000 mg | INTRAVENOUS | Status: DC | PRN

## 2024-01-05 MED ORDER — ASPIRIN 81 MG PO CHEW: 81.0000 mg | CHEWABLE_TABLET | ORAL | Status: DC

## 2024-01-05 MED ORDER — SODIUM CHLORIDE 0.9% FLUSH: 3.0000 mL | Freq: Two times a day (BID) | INTRAVENOUS | Status: DC

## 2024-01-05 MED ORDER — SODIUM CHLORIDE 0.9 % IV SOLN: 250.0000 mL | INTRAVENOUS | Status: DC | PRN

## 2024-01-05 MED ORDER — LIDOCAINE HCL (PF) 1 % IJ SOLN
INTRAMUSCULAR | Status: AC
  Filled 2024-01-05: qty 30

## 2024-01-05 MED ORDER — VERAPAMIL HCL 2.5 MG/ML IV SOLN
INTRAVENOUS | Status: DC | PRN
  Administered 2024-01-05: 10 mL via INTRA_ARTERIAL

## 2024-01-05 MED ORDER — SODIUM CHLORIDE 0.9% FLUSH: 3.0000 mL | INTRAVENOUS | Status: DC | PRN

## 2024-01-05 MED ORDER — IOHEXOL 350 MG/ML SOLN
INTRAVENOUS | Status: DC | PRN
  Administered 2024-01-05: 45 mL

## 2024-01-05 MED ORDER — HEPARIN SODIUM (PORCINE) 1000 UNIT/ML IJ SOLN
INTRAMUSCULAR | Status: AC
  Filled 2024-01-05: qty 10

## 2024-01-05 MED ORDER — MIDAZOLAM HCL 2 MG/2ML IJ SOLN
INTRAMUSCULAR | Status: DC | PRN
  Administered 2024-01-05: 2 mg via INTRAVENOUS

## 2024-01-05 MED ORDER — FREE WATER: 500.0000 mL | Freq: Once | Status: DC

## 2024-01-05 MED ORDER — ONDANSETRON HCL 4 MG/2ML IJ SOLN: 4.0000 mg | Freq: Four times a day (QID) | INTRAMUSCULAR | Status: DC | PRN

## 2024-01-05 MED ORDER — HEPARIN (PORCINE) IN NACL 1000-0.9 UT/500ML-% IV SOLN
INTRAVENOUS | Status: DC | PRN
  Administered 2024-01-05 (×2): 500 mL

## 2024-01-05 MED ORDER — FENTANYL CITRATE (PF) 100 MCG/2ML IJ SOLN
INTRAMUSCULAR | Status: AC
  Filled 2024-01-05: qty 2

## 2024-01-05 MED ORDER — FENTANYL CITRATE (PF) 100 MCG/2ML IJ SOLN
INTRAMUSCULAR | Status: DC | PRN
  Administered 2024-01-05: 50 ug via INTRAVENOUS

## 2024-01-05 MED ORDER — HYDRALAZINE HCL 20 MG/ML IJ SOLN: 10.0000 mg | INTRAMUSCULAR | Status: DC | PRN

## 2024-01-05 MED ORDER — VERAPAMIL HCL 2.5 MG/ML IV SOLN
INTRAVENOUS | Status: AC
  Filled 2024-01-05: qty 2

## 2024-01-05 MED ORDER — LIDOCAINE HCL (PF) 1 % IJ SOLN
INTRAMUSCULAR | Status: DC | PRN
  Administered 2024-01-05: 2 mL via INTRADERMAL

## 2024-01-05 MED ORDER — HEPARIN SODIUM (PORCINE) 1000 UNIT/ML IJ SOLN
INTRAMUSCULAR | Status: DC | PRN
  Administered 2024-01-05: 4000 [IU] via INTRAVENOUS

## 2024-01-05 MED ORDER — MIDAZOLAM HCL 2 MG/2ML IJ SOLN
INTRAMUSCULAR | Status: AC
  Filled 2024-01-05: qty 2

## 2024-01-05 SURGICAL SUPPLY — 7 items
CATH 5FR JL3.5 JR4 ANG PIG MP (CATHETERS) IMPLANT
DEVICE RAD COMP TR BAND LRG (VASCULAR PRODUCTS) IMPLANT
GLIDESHEATH SLEND SS 6F .021 (SHEATH) IMPLANT
GUIDEWIRE INQWIRE 1.5J.035X260 (WIRE) IMPLANT
KIT SINGLE USE MANIFOLD (KITS) IMPLANT
PACK CARDIAC CATHETERIZATION (CUSTOM PROCEDURE TRAY) ×1 IMPLANT
SET ATX-X65L (MISCELLANEOUS) IMPLANT

## 2024-01-05 NOTE — Interval H&P Note (Signed)
 History and Physical Interval Note:  01/05/2024 12:55 PM  Mance Sarli  has presented today for surgery, with the diagnosis of abnormal ct.  The various methods of treatment have been discussed with the patient and family. After consideration of risks, benefits and other options for treatment, the patient has consented to  Procedure(s): LEFT HEART CATH AND CORONARY ANGIOGRAPHY (N/A) as a surgical intervention.  The patient's history has been reviewed, patient examined, no change in status, stable for surgery.  I have reviewed the patient's chart and labs.  Questions were answered to the patient's satisfaction.    Cath Lab Visit (complete for each Cath Lab visit)  Clinical Evaluation Leading to the Procedure:   ACS: No.  Non-ACS:    Anginal Classification: No Symptoms  Anti-ischemic medical therapy: Minimal Therapy (1 class of medications)  Non-Invasive Test Results: High-risk stress test findings: cardiac mortality >3%/year (Severe two vessel CAD on coronary CTA)  Prior CABG: No previous CABG        Lonni Cash

## 2024-01-05 NOTE — Discharge Instructions (Signed)

## 2024-01-06 ENCOUNTER — Encounter (HOSPITAL_COMMUNITY): Payer: Self-pay | Admitting: Cardiovascular Disease

## 2024-01-06 ENCOUNTER — Encounter: Payer: Self-pay | Admitting: Gastroenterology

## 2024-01-11 ENCOUNTER — Telehealth: Payer: Self-pay | Admitting: Gastroenterology

## 2024-01-11 NOTE — Telephone Encounter (Signed)
 RN returned call to patient who was unclear about the total amount of Golytely  to drink in the evening and total amount to drink the following morning. RN explained that after filling the Golytely  container with water  up to the fill line, he is to drink 8 cups in the evening and 8 cups in the morning, following the time frames as outlined in the patient's prep instructions.  Patient stated understanding.

## 2024-01-11 NOTE — Telephone Encounter (Signed)
 Inbound call from patient requesting a call back to discuss hoe to mix his prep. Patient is scheduled for procedure on 9/4 at 8:30. Please advise.

## 2024-01-13 ENCOUNTER — Encounter: Payer: Self-pay | Admitting: Gastroenterology

## 2024-01-13 ENCOUNTER — Ambulatory Visit: Admitting: Gastroenterology

## 2024-01-13 VITALS — BP 134/76 | HR 57 | Temp 97.6°F | Resp 11 | Ht 70.0 in | Wt 180.0 lb

## 2024-01-13 DIAGNOSIS — D124 Benign neoplasm of descending colon: Secondary | ICD-10-CM | POA: Diagnosis not present

## 2024-01-13 DIAGNOSIS — K635 Polyp of colon: Secondary | ICD-10-CM | POA: Diagnosis not present

## 2024-01-13 DIAGNOSIS — Z860101 Personal history of adenomatous and serrated colon polyps: Secondary | ICD-10-CM | POA: Diagnosis not present

## 2024-01-13 DIAGNOSIS — K648 Other hemorrhoids: Secondary | ICD-10-CM | POA: Diagnosis not present

## 2024-01-13 DIAGNOSIS — K641 Second degree hemorrhoids: Secondary | ICD-10-CM | POA: Diagnosis not present

## 2024-01-13 DIAGNOSIS — F419 Anxiety disorder, unspecified: Secondary | ICD-10-CM | POA: Diagnosis not present

## 2024-01-13 DIAGNOSIS — E7800 Pure hypercholesterolemia, unspecified: Secondary | ICD-10-CM | POA: Diagnosis not present

## 2024-01-13 DIAGNOSIS — D122 Benign neoplasm of ascending colon: Secondary | ICD-10-CM

## 2024-01-13 DIAGNOSIS — Z1211 Encounter for screening for malignant neoplasm of colon: Secondary | ICD-10-CM

## 2024-01-13 DIAGNOSIS — I10 Essential (primary) hypertension: Secondary | ICD-10-CM | POA: Diagnosis not present

## 2024-01-13 DIAGNOSIS — I493 Ventricular premature depolarization: Secondary | ICD-10-CM | POA: Diagnosis not present

## 2024-01-13 DIAGNOSIS — I2510 Atherosclerotic heart disease of native coronary artery without angina pectoris: Secondary | ICD-10-CM | POA: Diagnosis not present

## 2024-01-13 MED ORDER — SODIUM CHLORIDE 0.9 % IV SOLN: 500.0000 mL | INTRAVENOUS | Status: DC

## 2024-01-13 NOTE — Progress Notes (Signed)
 Called to room to assist during endoscopic procedure.  Patient ID and intended procedure confirmed with present staff. Received instructions for my participation in the procedure from the performing physician.

## 2024-01-13 NOTE — Progress Notes (Signed)
 Sedate, gd SR, tolerated procedure well, VSS, report to RN

## 2024-01-13 NOTE — Progress Notes (Signed)
 VS by CL  Pt's states no medical or surgical changes since previsit or office visit.

## 2024-01-13 NOTE — Op Note (Signed)
 [facility] Patient Name: Richard Roach Procedure Date: 01/13/2024 8:53 AM MRN: [id number] Endoscopist: Sandor Flatter , MD, [id number] Age: 68 Referring MD:  Date of Birth: May 05, 1956 Gender: Male Account #: [id number] Procedure:                Colonoscopy Indications:              Surveillance: Personal history of adenomatous                            polyps on last colonoscopy 5 years ago, High risk                            colon cancer surveillance: Personal history of                            sessile serrated colon polyp (less than 10 mm in                            size) with no dysplasia                           Colonoscopy in 09/2007 with 5 mm transverse colon                            polyp, 5 mm sigmoid polyp (path: Serrated adenoma,                            hyperplastic polyp). Colonoscopy in 09/2012 with 6                            mm transverse colon serrated adenoma, 5 mm sigmoid                            hyperplastic polyp, small internal hemorrhoids.                            Repeat colonoscopy in 09/2017 and notable for grade                            1 internal hemorrhoids and otherwise unremarkable,                            with recommendation to repeat in 5 years due to                            prior history of serrated adenomatous polyps. Medicines:                Monitored Anesthesia Care Procedure:                Pre-Anesthesia Assessment:                           - Prior to the procedure, a History and Physical  was performed, and patient medications and                            allergies were reviewed. The patient's tolerance of                            previous anesthesia was also reviewed. The risks                            and benefits of the procedure and the sedation                            options and risks were discussed with the patient.                            All questions were answered, and  informed consent                            was obtained. Prior Anticoagulants: The patient has                            taken no anticoagulant or antiplatelet agents. ASA                            Grade Assessment: II - A patient with mild systemic                            disease. After reviewing the risks and benefits,                            the patient was deemed in satisfactory condition to                            undergo the procedure.                           After obtaining informed consent, the colonoscope                            was passed under direct vision. Throughout the                            procedure, the patient's blood pressure, pulse, and                            oxygen saturations were monitored continuously. The                            Olympus Scope SN [id number] was introduced through the                            anus and advanced to the the terminal ileum. The  colonoscopy was performed without difficulty. The                            patient tolerated the procedure well. The quality                            of the bowel preparation was good. The terminal                            ileum, ileocecal valve, appendiceal orifice, and                            rectum were photographed. Scope In: 9:27:22 AM Scope Out: 9:45:07 AM Scope Withdrawal Time: 0 hours 14 minutes 10 seconds  Total Procedure Duration: 0 hours 17 minutes 45 seconds  Findings:                 The perianal and digital rectal examinations were                            normal.                           Two mucous-capped and sessile polyps were found in                            the descending colon and ascending colon. The                            polyps were 2 to 4 mm in size. These polyps were                            removed with a cold snare. Resection and retrieval                            were complete. Estimated blood loss was  minimal.                           The exam was otherwise normal throughout the                            remainder of the colon.                           Non-bleeding internal hemorrhoids were found during                            retroflexion. The hemorrhoids were small.                           The terminal ileum appeared normal. Complications:            No immediate complications. Estimated Blood Loss:     Estimated blood loss was minimal. Impression:               - Two 2 to 4  mm polyps in the descending colon and                            in the ascending colon, removed with a cold snare.                            Resected and retrieved.                           - Non-bleeding internal hemorrhoids.                           - The examined portion of the ileum was normal. Recommendation:           - Patient has a contact number available for                            emergencies. The signs and symptoms of potential                            delayed complications were discussed with the                            patient. Return to normal activities tomorrow.                            Written discharge instructions were provided to the                            patient.                           - Resume previous diet.                           - Continue present medications.                           - Await pathology results.                           - Repeat colonoscopy for surveillance based on                            pathology results.                           - Return to GI office PRN. Sandor Flatter, MD 01/13/2024 9:54:02 AM

## 2024-01-13 NOTE — Progress Notes (Signed)
 GASTROENTEROLOGY PROCEDURE H&P NOTE   Primary Care Physician: Norleen Lynwood ORN, MD    Reason for Procedure:  Colon polyp surveillance  Plan:    Colonoscopy  Patient is appropriate for endoscopic procedure(s) in the ambulatory ([facility]) setting.  The nature of the procedure, as well as the risks, benefits, and alternatives were carefully and thoroughly reviewed with the patient. Ample time for discussion and questions allowed. The patient understood, was satisfied, and agreed to proceed.     HPI: Richard Roach is a 68 y.o. male who presents for colonoscopy for ongoing polyp surveillance.  Colonoscopy in 09/2007 with 5 mm transverse colon polyp, 5 mm sigmoid polyp (path: Serrated adenoma, hyperplastic polyp). Colonoscopy in 09/2012 with 6 mm transverse colon serrated adenoma, 5 mm sigmoid hyperplastic polyp, small internal hemorrhoids. Repeat colonoscopy in 09/2017 and notable for grade 1 internal hemorrhoids and otherwise unremarkable, with recommendation to repeat in 5 years due to prior history of serrated adenomatous polyps.   Was seen in the GI clinic on 09/27/2023 for constipation and associated right flank/abdominal pain.  Abdominal x-ray with mild retained stool burden and started on MiraLAX .  Recently underwent cardiac catheterization and nonobstructive plaque, normal ejection fraction and not on any blood thinner other than baby aspirin .  CBC normal.  Past Medical History:  Diagnosis Date   Allergic rhinitis    Anxiety state 05/02/2015   Chronic RLQ pain 05/02/2015   Colon polyps    Coronary artery disease    Fatty liver    GERD (gastroesophageal reflux disease)    Hypercholesterolemia    Hypertension    Inguinal hernia    Internal hemorrhoids    Liver fibrosis 05/02/2015   PVC's (premature ventricular contractions)    Serrated adenoma of colon    Sinus bradycardia     Past Surgical History:  Procedure Laterality Date   COLONOSCOPY     KNEE ARTHROSCOPY Right 10/09/2009    KNEE ARTHROSCOPY Left 05/11/2010   LEFT HEART CATH AND CORONARY ANGIOGRAPHY N/A 01/05/2024   Procedure: LEFT HEART CATH AND CORONARY ANGIOGRAPHY;  Surgeon: Verlin Lonni BIRCH, MD;  Location: [facility];  Service: Cardiovascular;  Laterality: N/A;   TONSILLECTOMY      Prior to Admission medications   Medication Sig Start Date End Date Taking? Authorizing Provider  Ascorbic Acid (VITAMIN C PO) Take 282 mg by mouth daily. Gummy    [provider]  aspirin  EC 81 MG tablet Take 1 tablet (81 mg total) by mouth daily. 10/06/19   O'NealDarryle Ned, MD  Cholecalciferol (VITAMIN D3) 50 MCG (2000 UT) capsule Take 2,000 Units by mouth daily.    [provider]  cyanocobalamin  (VITAMIN B12) 1000 MCG tablet Take 1,000 mcg by mouth daily. Gummy    [provider]  Evolocumab  (REPATHA  SURECLICK) 140 MG/ML SOAJ INJECT 140 MG UNDER THE SKIN EVERY TWO WEEKS INTO THE THIGH, STOMACH, OR UPPER ARM 12/13/23   O'Neal, Darryle Ned, MD  ezetimibe  (ZETIA ) 10 MG tablet TAKE ONE TABLET BY MOUTH ONE TIME DAILY Patient not taking: Reported on 12/29/2023 03/23/23   Norleen Lynwood ORN, MD  ibuprofen  (ADVIL ) 800 MG tablet Take 800 mg by mouth every 8 (eight) hours as needed for moderate pain (pain score 4-6) or mild pain (pain score 1-3).    [provider]  metoprolol  succinate (TOPROL -XL) 25 MG 24 hr tablet Take 1 tablet (25 mg total) by mouth at bedtime. 04/26/23   O'NealDarryle Ned, MD  Multiple Vitamin (MULTIVITAMIN ADULT  PO) Take 1 tablet by mouth daily. One a day gummy    [provider]  rosuvastatin  (CRESTOR ) 40 MG tablet TAKE ONE TABLET BY MOUTH ONE TIME DAILY 12/10/23   Norleen Lynwood ORN, MD  sildenafil  (VIAGRA ) 100 MG tablet 1 tab by mouth one daily as needed Patient taking differently: Take 25 mg by mouth as needed for erectile dysfunction. 11/12/20   Norleen Lynwood ORN, MD  tadalafil  (CIALIS ) 5 MG tablet Take 1 tablet (5 mg total) by mouth daily. Patient taking  differently: Take 5 mg by mouth as needed. 11/19/22   Norleen Lynwood ORN, MD    Current Outpatient Medications  Medication Sig Dispense Refill   Ascorbic Acid (VITAMIN C PO) Take 282 mg by mouth daily. Gummy     aspirin  EC 81 MG tablet Take 1 tablet (81 mg total) by mouth daily. 90 tablet 3   Cholecalciferol (VITAMIN D3) 50 MCG (2000 UT) capsule Take 2,000 Units by mouth daily.     cyanocobalamin  (VITAMIN B12) 1000 MCG tablet Take 1,000 mcg by mouth daily. Gummy     Evolocumab  (REPATHA  SURECLICK) 140 MG/ML SOAJ INJECT 140 MG UNDER THE SKIN EVERY TWO WEEKS INTO THE THIGH, STOMACH, OR UPPER ARM 6 mL 3   ezetimibe  (ZETIA ) 10 MG tablet TAKE ONE TABLET BY MOUTH ONE TIME DAILY (Patient not taking: Reported on 12/29/2023) 90 tablet 3   ibuprofen  (ADVIL ) 800 MG tablet Take 800 mg by mouth every 8 (eight) hours as needed for moderate pain (pain score 4-6) or mild pain (pain score 1-3).     metoprolol  succinate (TOPROL -XL) 25 MG 24 hr tablet Take 1 tablet (25 mg total) by mouth at bedtime. 90 tablet 2   Multiple Vitamin (MULTIVITAMIN ADULT PO) Take 1 tablet by mouth daily. One a day gummy     rosuvastatin  (CRESTOR ) 40 MG tablet TAKE ONE TABLET BY MOUTH ONE TIME DAILY 90 tablet 3   sildenafil  (VIAGRA ) 100 MG tablet 1 tab by mouth one daily as needed (Patient taking differently: Take 25 mg by mouth as needed for erectile dysfunction.) 5 tablet 11   tadalafil  (CIALIS ) 5 MG tablet Take 1 tablet (5 mg total) by mouth daily. (Patient taking differently: Take 5 mg by mouth as needed.) 90 tablet 3   No current facility-administered medications for this visit.    Allergies as of 01/13/2024   (No Known Allergies)    Family History  Problem Relation Age of Onset   Asthma Father    High blood pressure Mother    Colon cancer Neg Hx    Colon polyps Neg Hx    Diabetes Neg Hx    Kidney disease Neg Hx    Esophageal cancer Neg Hx    Liver cancer Neg Hx    Rectal cancer Neg Hx    Stomach cancer Neg Hx     Social  History   Socioeconomic History   Marital status: Married    Spouse name: Not on file   Number of children: 2   Years of education: Not on file   Highest education level: Not on file  Occupational History   Occupation: Hydrographic surveyor  Tobacco Use   Smoking status: Former    Current packs/day: 0.00    Types: Cigarettes    Quit date: 05/11/1998    Years since quitting: 25.6   Smokeless tobacco: Never   Tobacco comments:    smoked only socially x 5 yrs  Vaping Use   Vaping status: Never  Used  Substance and Sexual Activity   Alcohol use: Yes    Alcohol/week: 14.0 standard drinks of alcohol    Types: 14 Glasses of wine per week   Drug use: No   Sexual activity: Yes  Other Topics Concern   Not on file  Social History Narrative   He lives with his wife, he is a Transport planner, travel, desk job, no smoking.   Social Drivers of Corporate investment banker Strain: Low Risk  (05/14/2023)   Overall Financial Resource Strain (CARDIA)    Difficulty of Paying Living Expenses: Not hard at all  Food Insecurity: No Food Insecurity (05/14/2023)   Hunger Vital Sign    Worried About Running Out of Food in the Last Year: Never true    Ran Out of Food in the Last Year: Never true  Transportation Needs: No Transportation Needs (05/14/2023)   PRAPARE - Administrator, Civil Service (Medical): No    Lack of Transportation (Non-Medical): No  Physical Activity: Sufficiently Active (05/14/2023)   Exercise Vital Sign    Days of Exercise per Week: 5 days    Minutes of Exercise per Session: 30 min  Stress: No Stress Concern Present (05/14/2023)   [employer] of Occupational Health - Occupational Stress Questionnaire    Feeling of Stress : Not at all  Social Connections: Moderately Isolated (05/14/2023)   Social Connection and Isolation Panel    Frequency of Communication with Friends and Family: Three times a week    Frequency of Social Gatherings with Friends and Family: Not on file     Attends Religious Services: Never    Active Member of Clubs or Organizations: No    Attends Banker Meetings: Never    Marital Status: Married  Catering manager Violence: Not At Risk (05/14/2023)   Humiliation, Afraid, Rape, and Kick questionnaire    Fear of Current or Ex-Partner: No    Emotionally Abused: No    Physically Abused: No    Sexually Abused: No    Physical Exam: Vital signs in last 24 hours: @There  were no vitals taken for this visit. GEN: NAD EYE: Sclerae anicteric ENT: MMM CV: Non-tachycardic Pulm: CTA b/l GI: Soft, NT/ND NEURO:  Alert & Oriented x 3   Sandor Flatter, DO [facility]   01/13/2024 8:53 AM

## 2024-01-13 NOTE — Patient Instructions (Addendum)
 Resume previous diet.                           - Continue present medications.                           - Await pathology results.                           - Repeat colonoscopy for surveillance based on                            pathology results.                           - Return to GI office PRN. Handout on polyps given      YOU HAD AN ENDOSCOPIC PROCEDURE TODAY AT THE [facility]:   Refer to the procedure report that was given to you for any specific questions about what was found during the examination.  If the procedure report does not answer your questions, please call your gastroenterologist to clarify.  If you requested that your care partner not be given the details of your procedure findings, then the procedure report has been included in a sealed envelope for you to review at your convenience later.  YOU SHOULD EXPECT: Some feelings of bloating in the abdomen. Passage of more gas than usual.  Walking can help get rid of the air that was put into your GI tract during the procedure and reduce the bloating. If you had a lower endoscopy (such as a colonoscopy or flexible sigmoidoscopy) you may notice spotting of blood in your stool or on the toilet paper. If you underwent a bowel prep for your procedure, you may not have a normal bowel movement for a few days.  Please Note:  You might notice some irritation and congestion in your nose or some drainage.  This is from the oxygen used during your procedure.  There is no need for concern and it should clear up in a day or so.  SYMPTOMS TO REPORT IMMEDIATELY:  Following lower endoscopy (colonoscopy or flexible sigmoidoscopy):  Excessive amounts of blood in the stool  Significant tenderness or worsening of abdominal pains  Swelling of the abdomen that is new, acute  Fever of 100F or higher   For urgent or emergent issues, a gastroenterologist can be reached at any hour by calling [phone]. Do not use MyChart  messaging for urgent concerns.    DIET:  We do recommend a small meal at first, but then you may proceed to your regular diet.  Drink plenty of fluids but you should avoid alcoholic beverages for 24 hours.  ACTIVITY:  You should plan to take it easy for the rest of today and you should NOT DRIVE or use heavy machinery until tomorrow (because of the sedation medicines used during the test).    FOLLOW UP: Our staff will call the number listed on your records the next business day following your procedure.  We will call around 7:15- 8:00 am to check on you and address any questions or concerns that you may have regarding the information given to you following your procedure. If we do not reach you, we will leave a message.     If any biopsies were taken  you will be contacted by phone or by letter within the next 1-3 weeks.  Please call us at [phone] if you have not heard about the biopsies in 3 weeks.    SIGNATURES/CONFIDENTIALITY: You and/or your care partner have signed paperwork which will be entered into your electronic medical record.  These signatures attest to the fact that that the information above on your After Visit Summary has been reviewed and is understood.  Full responsibility of the confidentiality of this discharge information lies with you and/or your care-partner.

## 2024-01-14 ENCOUNTER — Telehealth: Payer: Self-pay | Admitting: *Deleted

## 2024-01-14 ENCOUNTER — Encounter: Payer: Self-pay | Admitting: Internal Medicine

## 2024-01-14 ENCOUNTER — Ambulatory Visit (INDEPENDENT_AMBULATORY_CARE_PROVIDER_SITE_OTHER): Admitting: Internal Medicine

## 2024-01-14 VITALS — BP 122/78 | HR 65 | Temp 97.7°F | Ht 70.0 in | Wt 180.0 lb

## 2024-01-14 DIAGNOSIS — E7800 Pure hypercholesterolemia, unspecified: Secondary | ICD-10-CM

## 2024-01-14 DIAGNOSIS — I10 Essential (primary) hypertension: Secondary | ICD-10-CM | POA: Diagnosis not present

## 2024-01-14 DIAGNOSIS — E559 Vitamin D deficiency, unspecified: Secondary | ICD-10-CM | POA: Diagnosis not present

## 2024-01-14 DIAGNOSIS — T7840XA Allergy, unspecified, initial encounter: Secondary | ICD-10-CM | POA: Diagnosis not present

## 2024-01-14 DIAGNOSIS — R739 Hyperglycemia, unspecified: Secondary | ICD-10-CM

## 2024-01-14 MED ORDER — TRIAMCINOLONE ACETONIDE 0.1 % EX CREA: 1.0000 | TOPICAL_CREAM | Freq: Two times a day (BID) | CUTANEOUS | 1 refills | Status: AC

## 2024-01-14 MED ORDER — METHYLPREDNISOLONE ACETATE 80 MG/ML IJ SUSP
80.0000 mg | Freq: Once | INTRAMUSCULAR | Status: AC
Start: 2024-01-14 — End: 2024-01-14
  Administered 2024-01-14: 80 mg via INTRAMUSCULAR

## 2024-01-14 MED ORDER — PREDNISONE 10 MG PO TABS: ORAL_TABLET | ORAL | 0 refills | Status: DC

## 2024-01-14 NOTE — Telephone Encounter (Signed)
  Follow up Call-     01/13/2024    9:05 AM  Call back number  Post procedure Call Back phone  # [phone]  Permission to leave phone message Yes     Patient questions:  Do you have a fever, pain , or abdominal swelling? No. Pain Score  0 *  Have you tolerated food without any problems? Yes.    Have you been able to return to your normal activities? Yes.    Do you have any questions about your discharge instructions: Diet   No. Medications  No. Follow up visit  No.  Do you have questions or concerns about your Care? No.  Actions: * If pain score is 4 or above: No action needed, pain <4.

## 2024-01-14 NOTE — Progress Notes (Signed)
 Patient ID: Richard Roach, male   DOB: 12/12/1955, 68 y.o.   MRN: [id number]        Chief Complaint: follow up allergic rash, recent colon polyp, htn, hld, hyperglycemia       HPI:  [name] is a 68 y.o. male here with c/o 3 days onset numerous red itchy small red lesions with intense pruritus  and has scratched to bleeding with several.  Had been working in the yard prior to this onset,  After reading on google he is concerned about possible parasites.  Pt denies chest pain, increased sob or doe, wheezing, orthopnea, PND, increased LE swelling, palpitations, dizziness or syncope.   Pt denies polydipsia, polyuria, or new focal neuro s/s.   Did have recent colonoscopy with colon polyp - path pending still.         Wt Readings from Last 3 Encounters:  01/14/24 180 lb (81.6 kg)  01/13/24 180 lb (81.6 kg)  01/05/24 180 lb (81.6 kg)   BP Readings from Last 3 Encounters:  01/14/24 122/78  01/13/24 134/76  01/05/24 128/80         Past Medical History:  Diagnosis Date   Allergic rhinitis    Anxiety state 05/02/2015   Chronic RLQ pain 05/02/2015   Colon polyps    Coronary artery disease    Fatty liver    GERD (gastroesophageal reflux disease)    Hypercholesterolemia    Hypertension    Inguinal hernia    Internal hemorrhoids    Liver fibrosis 05/02/2015   PVC's (premature ventricular contractions)    Serrated adenoma of colon    Sinus bradycardia    Past Surgical History:  Procedure Laterality Date   COLONOSCOPY     KNEE ARTHROSCOPY Right 10/09/2009   KNEE ARTHROSCOPY Left 05/11/2010   LEFT HEART CATH AND CORONARY ANGIOGRAPHY N/A 01/05/2024   Procedure: LEFT HEART CATH AND CORONARY ANGIOGRAPHY;  Surgeon: Verlin Lonni BIRCH, MD;  Location: [facility];  Service: Cardiovascular;  Laterality: N/A;   TONSILLECTOMY      reports that he quit smoking about 25 years ago. His smoking use included cigarettes. He has never used smokeless tobacco. He reports current alcohol use of about  14.0 standard drinks of alcohol per week. He reports that he does not use drugs. family history includes Asthma in his father; High blood pressure in his mother. No Known Allergies Current Outpatient Medications on File Prior to Visit  Medication Sig Dispense Refill   Ascorbic Acid (VITAMIN C PO) Take 282 mg by mouth daily. Gummy     aspirin  EC 81 MG tablet Take 1 tablet (81 mg total) by mouth daily. 90 tablet 3   Cholecalciferol (VITAMIN D3) 50 MCG (2000 UT) capsule Take 2,000 Units by mouth daily.     cyanocobalamin  (VITAMIN B12) 1000 MCG tablet Take 1,000 mcg by mouth daily. Gummy     Evolocumab  (REPATHA  SURECLICK) 140 MG/ML SOAJ INJECT 140 MG UNDER THE SKIN EVERY TWO WEEKS INTO THE THIGH, STOMACH, OR UPPER ARM 6 mL 3   metoprolol  succinate (TOPROL -XL) 25 MG 24 hr tablet Take 1 tablet (25 mg total) by mouth at bedtime. 90 tablet 2   Multiple Vitamin (MULTIVITAMIN ADULT PO) Take 1 tablet by mouth daily. One a day gummy     rosuvastatin  (CRESTOR ) 40 MG tablet TAKE ONE TABLET BY MOUTH ONE TIME DAILY 90 tablet 3   tadalafil  (CIALIS ) 5 MG tablet Take 1 tablet (5 mg total) by mouth daily. (Patient taking differently:  Take 5 mg by mouth as needed.) 90 tablet 3   No current facility-administered medications on file prior to visit.        ROS:  All others reviewed and negative.  Objective        PE:  BP 122/78   Pulse 65   Temp 97.7 F (36.5 C)   Ht 5' 10 (1.778 m)   Wt 180 lb (81.6 kg)   SpO2 98%   BMI 25.83 kg/m                 Constitutional: Pt appears in NAD               HENT: Head: NCAT.                Right Ear: External ear normal.                 Left Ear: External ear normal.                Eyes: . Pupils are equal, round, and reactive to light. Conjunctivae and EOM are normal               Nose: without d/c or deformity               Neck: Neck supple. Gross normal ROM               Cardiovascular: Normal rate and regular rhythm.                 Pulmonary/Chest: Effort  normal and breath sounds without rales or wheezing.                Abd:  Soft, NT, ND, + BS, no organomegaly               Neurological: Pt is alert. At baseline orientation, motor grossly intact               Skin: Skin is warm. LE edema - none, has numerous red itchy lesions to extremities below the waist and elbows., several with excoriations               Psychiatric: Pt behavior is normal without agitation   Micro: none  Cardiac tracings I have personally interpreted today:  none  Pertinent Radiological findings (summarize): none   Lab Results  Component Value Date   WBC 5.7 01/03/2024   HGB 14.9 01/03/2024   HCT 44.6 01/03/2024   PLT 203 01/03/2024   GLUCOSE 73 12/16/2023   CHOL 112 11/17/2023   TRIG 41.0 11/17/2023   HDL 69.40 11/17/2023   LDLDIRECT 164.0 04/18/2012   LDLCALC 34 11/17/2023   ALT 18 11/17/2023   AST 17 11/17/2023   NA 138 12/16/2023   K 4.6 12/16/2023   CL 102 12/16/2023   CREATININE 1.05 12/16/2023   BUN 24 12/16/2023   CO2 21 12/16/2023   TSH 1.33 11/17/2023   PSA 2.35 11/17/2023   HGBA1C 5.9 11/17/2023   Assessment/Plan:  Richard Roach is a 68 y.o. White or Caucasian [1] male with  has a past medical history of Allergic rhinitis, Anxiety state (05/02/2015), Chronic RLQ pain (05/02/2015), Colon polyps, Coronary artery disease, Fatty liver, GERD (gastroesophageal reflux disease), Hypercholesterolemia, Hypertension, Inguinal hernia, Internal hemorrhoids, Liver fibrosis (05/02/2015), PVC's (premature ventricular contractions), Serrated adenoma of colon, and Sinus bradycardia.  Essential hypertension BP Readings from Last 3 Encounters:  01/14/24 122/78  01/13/24 134/76  01/05/24 128/80   Stable,  pt to continue medical treatment toprol  xl 25 qd   Hypercholesterolemia Lab Results  Component Value Date   LDLCALC 34 11/17/2023   Stable, pt to continue current statin crestor  40 mg qd   Hyperglycemia Lab Results  Component Value Date   HGBA1C 5.9  11/17/2023   Stable, pt to continue current medical treatment  - diet, wt control   Vitamin D  deficiency Last vitamin D  Lab Results  Component Value Date   VD25OH 43.17 11/17/2023   Stable, cont oral replacement   Allergic rash present on examination D/w pt, no likely parasite related but allergic after working outside, now for depomedrol 80 mg IM, prednisone  taper, triam 0.1 % cream asd prn  Followup: Return if symptoms worsen or fail to improve.  Lynwood Rush, MD 01/15/2024 8:35 PM [facility]

## 2024-01-14 NOTE — Patient Instructions (Signed)
 You had the steroid shot today  Please take all new medication as prescribed - the prednisone  and the cream as needed  Please continue all other medications as before, and refills have been done if requested.  Please have the pharmacy call with any other refills you may need.  Please keep your appointments with your specialists as you may have planned

## 2024-01-15 ENCOUNTER — Encounter: Payer: Self-pay | Admitting: Internal Medicine

## 2024-01-15 NOTE — Assessment & Plan Note (Signed)
 Lab Results  Component Value Date   HGBA1C 5.9 11/17/2023   Stable, pt to continue current medical treatment  - diet, wt control

## 2024-01-15 NOTE — Assessment & Plan Note (Signed)
 Lab Results  Component Value Date   LDLCALC 34 11/17/2023   Stable, pt to continue current statin crestor  40 mg qd

## 2024-01-15 NOTE — Assessment & Plan Note (Signed)
 Last vitamin D  Lab Results  Component Value Date   VD25OH 43.17 11/17/2023   Stable, cont oral replacement

## 2024-01-15 NOTE — Assessment & Plan Note (Signed)
 D/w pt, no likely parasite related but allergic after working outside, now for depomedrol 80 mg IM, prednisone  taper, triam 0.1 % cream asd prn

## 2024-01-15 NOTE — Assessment & Plan Note (Signed)
 BP Readings from Last 3 Encounters:  01/14/24 122/78  01/13/24 134/76  01/05/24 128/80   Stable, pt to continue medical treatment toprol  xl 25 qd

## 2024-01-17 ENCOUNTER — Ambulatory Visit: Payer: Self-pay | Admitting: Gastroenterology

## 2024-01-17 LAB — SURGICAL PATHOLOGY

## 2024-02-03 ENCOUNTER — Other Ambulatory Visit: Payer: Self-pay | Admitting: Cardiovascular Disease

## 2024-02-03 ENCOUNTER — Encounter: Payer: Self-pay | Admitting: Pharmacist

## 2024-02-03 NOTE — Progress Notes (Signed)
 Pharmacy Quality Measure Review  This patient is appearing on a report for being at risk of failing the adherence measure for cholesterol (statin) medications this calendar year.   Medication: Rosuvastatin  40 mg Last fill date: 12/11/23 for 90 day supply  Insurance report was not up to date. No action needed at this time.   Darrelyn Drum, PharmD, BCPS, CPP Clinical Pharmacist Practitioner [facility] [phone]

## 2024-02-04 ENCOUNTER — Ambulatory Visit
Admission: RE | Admit: 2024-02-04 | Discharge: 2024-02-04 | Disposition: A | Discharge status: Home / Self Care | Payer: Self-pay | Source: Ambulatory Visit | Attending: Emergency Medicine | Admitting: Emergency Medicine

## 2024-02-04 VITALS — BP 164/91 | HR 73 | Temp 97.7°F | Resp 17

## 2024-02-04 DIAGNOSIS — U071 COVID-19: Secondary | ICD-10-CM | POA: Diagnosis not present

## 2024-02-04 MED ORDER — PAXLOVID (300/100) 20 X 150 MG & 10 X 100MG PO TBPK: 3.0000 | ORAL_TABLET | Freq: Two times a day (BID) | ORAL | 0 refills | Status: AC

## 2024-02-04 NOTE — ED Triage Notes (Signed)
 Pt c/o congestion, fatigue, chills, sinus pressure Monday afternoon while in [place].  Had positive at home covid test last night.

## 2024-02-04 NOTE — ED Provider Notes (Signed)
 [facility]    CSN: [id number] Arrival date & time: 02/04/24  0905      History   Chief Complaint Chief Complaint  Patient presents with   Influenza    Tested positive for COVID 19 have aches severe congestion and chills - Entered by patient    HPI Richard Roach is a 68 y.o. male.   Patient presents to clinic for concern of testing positive for COVID-19 last night.  5 days ago he was in [place] for business and he noticed that he had nasal congestion, sinus pressure and fatigue.  Symptoms continued and he decided to test at home for COVID-19.  Has a mild cough.  Denies wheezing or shortness of breath.  Cough is nonproductive.  Has not had fevers.  Has been taking NyQuil.  Medical history of coronary artery disease, hypertension, hypercholesterolemia, PVCs and fatty liver.  The history is provided by the patient and medical records.  Influenza   Past Medical History:  Diagnosis Date   Allergic rhinitis    Anxiety state 05/02/2015   Chronic RLQ pain 05/02/2015   Colon polyps    Coronary artery disease    Fatty liver    GERD (gastroesophageal reflux disease)    Hypercholesterolemia    Hypertension    Inguinal hernia    Internal hemorrhoids    Liver fibrosis 05/02/2015   PVC's (premature ventricular contractions)    Serrated adenoma of colon    Sinus bradycardia     Patient Active Problem List   Diagnosis Date Noted   Allergic rash present on examination 01/14/2024   Sinusitis 03/17/2023   Insomnia 03/17/2023   History of adenomatous polyp of colon 12/31/2022   Other fatigue 11/22/2022   Vitamin D  deficiency 09/25/2021   Coronary artery calcification seen on CT scan 09/19/2020   Right groin pain 07/11/2020   Nasal sore 01/09/2019   Right-sided chest pain 12/14/2017   Groin lump 09/09/2017   Hyperglycemia 04/28/2017   Chest pain 04/28/2017   Cough 03/11/2017   Abnormal breath sounds 03/11/2017   Upper respiratory tract infection 01/05/2017    Erectile dysfunction 04/24/2016   Hypercholesterolemia 02/06/2016   Anxiety state 05/02/2015   Liver fibrosis 05/02/2015   Chronic RLQ pain 05/02/2015   Benign prostatic hyperplasia 05/02/2015   Colon polyps    Serrated adenoma of colon    PVC's (premature ventricular contractions)    Fatty liver 03/14/2014   Encounter for well adult exam with abnormal findings 04/25/2013   Palpitations 04/17/2013   Impacted cerumen of right ear 09/24/2012   Nontraumatic hematoma of penis 04/29/2012   Spermatocele 04/29/2012   Abdominal pain 12/06/2010   HYPERHIDROSIS 04/30/2009   Essential hypertension 06/09/2007   Allergic rhinitis 06/09/2007    Past Surgical History:  Procedure Laterality Date   COLONOSCOPY     KNEE ARTHROSCOPY Right 10/09/2009   KNEE ARTHROSCOPY Left 05/11/2010   LEFT HEART CATH AND CORONARY ANGIOGRAPHY N/A 01/05/2024   Procedure: LEFT HEART CATH AND CORONARY ANGIOGRAPHY;  Surgeon: Verlin Lonni BIRCH, MD;  Location: [facility];  Service: Cardiovascular;  Laterality: N/A;   TONSILLECTOMY         Home Medications    Prior to Admission medications   Medication Sig Start Date End Date Taking? Authorizing Provider  nirmatrelvir/ritonavir (PAXLOVID , 300/100,) 20 x 150 MG & 10 x 100MG  TBPK Take 3 tablets by mouth 2 (two) times daily for 5 days. Patient GFR is WNL. Take nirmatrelvir (150 mg) two tablets twice daily  for 5 days and ritonavir (100 mg) one tablet twice daily for 5 days. 02/04/24 02/09/24 Yes Pressley Barsky  N, FNP  Ascorbic Acid (VITAMIN C PO) Take 282 mg by mouth daily. Gummy    [provider]  aspirin  EC 81 MG tablet Take 1 tablet (81 mg total) by mouth daily. 10/06/19   O'NealDarryle Ned, MD  Cholecalciferol (VITAMIN D3) 50 MCG (2000 UT) capsule Take 2,000 Units by mouth daily.    [provider]  cyanocobalamin  (VITAMIN B12) 1000 MCG tablet Take 1,000 mcg by mouth daily. Gummy    [provider]  Evolocumab  (REPATHA   SURECLICK) 140 MG/ML SOAJ INJECT 140 MG UNDER THE SKIN EVERY TWO WEEKS INTO THE THIGH, STOMACH, OR UPPER ARM 12/13/23   O'Neal, Darryle Ned, MD  metoprolol  succinate (TOPROL -XL) 25 MG 24 hr tablet TAKE ONE TABLET BY MOUTH AT BEDTIME 02/04/24   O'Neal, Darryle Ned, MD  Multiple Vitamin (MULTIVITAMIN ADULT PO) Take 1 tablet by mouth daily. One a day gummy    [provider]  predniSONE  (DELTASONE ) 10 MG tablet 3 tabs by mouth per day for 3 days,2tabs per day for 3 days,1tab per day for 3 days Patient not taking: Reported on 02/04/2024 01/14/24   Norleen Lynwood ORN, MD  rosuvastatin  (CRESTOR ) 40 MG tablet TAKE ONE TABLET BY MOUTH ONE TIME DAILY 12/10/23   Norleen Lynwood ORN, MD  tadalafil  (CIALIS ) 5 MG tablet Take 1 tablet (5 mg total) by mouth daily. Patient taking differently: Take 5 mg by mouth as needed. 11/19/22   Norleen Lynwood ORN, MD  triamcinolone  cream (KENALOG ) 0.1 % Apply 1 Application topically 2 (two) times daily. 01/14/24 01/13/25  Norleen Lynwood ORN, MD    Family History Family History  Problem Relation Age of Onset   Asthma Father    High blood pressure Mother    Colon cancer Neg Hx    Colon polyps Neg Hx    Diabetes Neg Hx    Kidney disease Neg Hx    Esophageal cancer Neg Hx    Liver cancer Neg Hx    Rectal cancer Neg Hx    Stomach cancer Neg Hx     Social History Social History   Tobacco Use   Smoking status: Former    Current packs/day: 0.00    Types: Cigarettes    Quit date: 05/11/1998    Years since quitting: 25.7   Smokeless tobacco: Never   Tobacco comments:    smoked only socially x 5 yrs  Vaping Use   Vaping status: Never Used  Substance Use Topics   Alcohol use: Yes    Alcohol/week: 14.0 standard drinks of alcohol    Types: 14 Glasses of wine per week   Drug use: No     Allergies   Patient has no known allergies.   Review of Systems Review of Systems  Per  HPI  Physical Exam Triage Vital Signs ED Triage Vitals  Encounter Vitals Group     BP 02/04/24 0910  (!) 164/91     Girls Systolic BP Percentile --      Girls Diastolic BP Percentile --      Boys Systolic BP Percentile --      Boys Diastolic BP Percentile --      Pulse Rate 02/04/24 0909 73     Resp 02/04/24 0909 17     Temp 02/04/24 0910 97.7 F (36.5 C)     Temp Source 02/04/24 0909 Oral     SpO2  02/04/24 0909 98 %     Weight --      Height --      Head Circumference --      Peak Flow --      Pain Score 02/04/24 0909 4     Pain Loc --      Pain Education --      Exclude from Growth Chart --    No data found.  Updated Vital Signs BP (!) 164/91 (BP Location: Right Arm)   Pulse 73   Temp 97.7 F (36.5 C)   Resp 17   SpO2 98%   Visual Acuity Right Eye Distance:   Left Eye Distance:   Bilateral Distance:    Right Eye Near:   Left Eye Near:    Bilateral Near:     Physical Exam Vitals and nursing note reviewed.  Constitutional:      Appearance: Normal appearance.  HENT:     Head: Normocephalic and atraumatic.     Right Ear: External ear normal.     Left Ear: External ear normal.     Nose: Congestion and rhinorrhea present.     Mouth/Throat:     Mouth: Mucous membranes are moist.     Pharynx: Posterior oropharyngeal erythema present.  Eyes:     Conjunctiva/sclera: Conjunctivae normal.  Cardiovascular:     Rate and Rhythm: Normal rate and regular rhythm.     Heart sounds: Normal heart sounds. No murmur heard. Pulmonary:     Effort: Pulmonary effort is normal. No respiratory distress.     Breath sounds: Normal breath sounds. No wheezing.  Skin:    General: Skin is warm and dry.  Neurological:     General: No focal deficit present.     Mental Status: He is alert.  Psychiatric:        Mood and Affect: Mood normal.      UC Treatments / Results  Labs (all labs ordered are listed, but only abnormal results are displayed) Labs Reviewed - No data to display  EKG   Radiology No results found.  Procedures Procedures (including critical care  time)  Medications Ordered in UC Medications - No data to display  Initial Impression / Assessment and Plan / UC Course  I have reviewed the triage vital signs and the nursing notes.  Pertinent labs & imaging results that were available during my care of the patient were reviewed by me and considered in my medical decision making (see chart for details).  And triage reviewed, patient is hemodynamically stable.  Congestion, rhinorrhea and postnasal drip present.  Lungs vesicular, heart with regular rate and rhythm.  Positive COVID-19 testing at home last night.  Within 5-day window for antiviral treatment.  Patient has high risk factors such as age, hyperlipidemia, hypertension and coronary artery disease.  Normal renal function 1 month ago.  Advised to stop rosuvastatin  while on this occasion and to not take Cialis .  Will send in Paxlovid  to help prevent hospitalization and severe COVID-19.  Symptomatic management for viral illness discussed.  Plan of care, follow-up care and return precautions given, no questions at this time    Final Clinical Impressions(s) / UC Diagnoses   Final diagnoses:  COVID-19 virus infection     Discharge Instructions      Take the Paxlovid  as prescribed.  Do not take your Crestor  (rosuvastatin ) while on this medication.  You can resume this medication after you are done with Paxlovid .  Do not take your Cialis  (  tadalafil ) while on this medication, this increases your risk of hypotension and syncope.  You are no longer considered contagious after you are fever free without antipyretics for 24 hours.  You can take 1200 mg of Mucinex daily to help with your congestion in addition to Flonase.  Ensure you are drinking at least 64 ounces of water  daily.  You can take Tylenol  as needed for body aches and chills.  Symptoms should improve with.  If you develop wheezing, shortness of breath, or new concerning symptoms please return to clinic or follow-up with your  primary care provider for reevaluation.     ED Prescriptions     Medication Sig Dispense Auth. Provider   nirmatrelvir/ritonavir (PAXLOVID , 300/100,) 20 x 150 MG & 10 x 100MG  TBPK Take 3 tablets by mouth 2 (two) times daily for 5 days. Patient GFR is WNL. Take nirmatrelvir (150 mg) two tablets twice daily for 5 days and ritonavir (100 mg) one tablet twice daily for 5 days. 30 tablet Dreama, Abner Ardis  N, FNP      PDMP not reviewed this encounter.   Dreama, Reeva Davern  N, FNP 02/04/24 [phone]

## 2024-02-04 NOTE — Discharge Instructions (Signed)
 Take the Paxlovid  as prescribed.  Do not take your Crestor  (rosuvastatin ) while on this medication.  You can resume this medication after you are done with Paxlovid .  Do not take your Cialis  (tadalafil ) while on this medication, this increases your risk of hypotension and syncope.  You are no longer considered contagious after you are fever free without antipyretics for 24 hours.  You can take 1200 mg of Mucinex daily to help with your congestion in addition to Flonase.  Ensure you are drinking at least 64 ounces of water  daily.  You can take Tylenol  as needed for body aches and chills.  Symptoms should improve with.  If you develop wheezing, shortness of breath, or new concerning symptoms please return to clinic or follow-up with your primary care provider for reevaluation.

## 2024-02-18 ENCOUNTER — Other Ambulatory Visit: Payer: Self-pay

## 2024-02-18 ENCOUNTER — Emergency Department (HOSPITAL_COMMUNITY)
Admission: EM | Admit: 2024-02-18 | Discharge: 2024-02-18 | Discharge status: Against Medical Advice | Attending: Emergency Medicine | Admitting: Emergency Medicine

## 2024-02-18 DIAGNOSIS — M545 Low back pain, unspecified: Secondary | ICD-10-CM | POA: Diagnosis not present

## 2024-02-18 DIAGNOSIS — Z5321 Procedure and treatment not carried out due to patient leaving prior to being seen by health care provider: Secondary | ICD-10-CM | POA: Insufficient documentation

## 2024-02-18 DIAGNOSIS — R1032 Left lower quadrant pain: Secondary | ICD-10-CM | POA: Insufficient documentation

## 2024-02-18 LAB — CBC
HCT: 46.6 % (ref 39.0–52.0)
Hemoglobin: 16.2 g/dL (ref 13.0–17.0)
MCH: 31.2 pg (ref 26.0–34.0)
MCHC: 34.8 g/dL (ref 30.0–36.0)
MCV: 89.8 fL (ref 80.0–100.0)
Platelets: 217 K/uL (ref 150–400)
RBC: 5.19 MIL/uL (ref 4.22–5.81)
RDW: 13 % (ref 11.5–15.5)
WBC: 8.4 K/uL (ref 4.0–10.5)
nRBC: 0 % (ref 0.0–0.2)

## 2024-02-18 LAB — URINALYSIS, ROUTINE W REFLEX MICROSCOPIC
Bilirubin Urine: NEGATIVE
Glucose, UA: NEGATIVE mg/dL
Hgb urine dipstick: NEGATIVE
Ketones, ur: NEGATIVE mg/dL
Leukocytes,Ua: NEGATIVE
Nitrite: NEGATIVE
Protein, ur: NEGATIVE mg/dL
Specific Gravity, Urine: 1.003 — ABNORMAL LOW (ref 1.005–1.030)
pH: 7 (ref 5.0–8.0)

## 2024-02-18 LAB — COMPREHENSIVE METABOLIC PANEL WITH GFR
ALT: 16 U/L (ref 0–44)
AST: 19 U/L (ref 15–41)
Albumin: 3.9 g/dL (ref 3.5–5.0)
Alkaline Phosphatase: 66 U/L (ref 38–126)
Anion gap: 10 (ref 5–15)
BUN: 18 mg/dL (ref 8–23)
CO2: 25 mmol/L (ref 22–32)
Calcium: 9.3 mg/dL (ref 8.9–10.3)
Chloride: 101 mmol/L (ref 98–111)
Creatinine, Ser: 1.15 mg/dL (ref 0.61–1.24)
GFR, Estimated: >60 mL/min (ref >60)
Glucose, Bld: 122 mg/dL — ABNORMAL HIGH (ref 70–99)
Potassium: 3.8 mmol/L (ref 3.5–5.1)
Sodium: 136 mmol/L (ref 135–145)
Total Bilirubin: 1.1 mg/dL (ref 0.0–1.2)
Total Protein: 7.2 g/dL (ref 6.5–8.1)

## 2024-02-18 LAB — LIPASE, BLOOD: Lipase: 29 U/L (ref 11–51)

## 2024-02-18 NOTE — ED Triage Notes (Signed)
 Pt c/o left sided flank/lower abd pain with radiation to left lower back and groin x 2 days, pt denies n/v/d/fever/urinary symptoms

## 2024-02-20 ENCOUNTER — Emergency Department (HOSPITAL_COMMUNITY)

## 2024-02-20 ENCOUNTER — Other Ambulatory Visit: Payer: Self-pay

## 2024-02-20 ENCOUNTER — Encounter (HOSPITAL_COMMUNITY): Payer: Self-pay

## 2024-02-20 ENCOUNTER — Emergency Department (HOSPITAL_COMMUNITY)
Admission: EM | Admit: 2024-02-20 | Discharge: 2024-02-20 | Disposition: A | Discharge status: Home / Self Care | Attending: Emergency Medicine | Admitting: Emergency Medicine

## 2024-02-20 DIAGNOSIS — N453 Epididymo-orchitis: Secondary | ICD-10-CM | POA: Insufficient documentation

## 2024-02-20 DIAGNOSIS — K4090 Unilateral inguinal hernia, without obstruction or gangrene, not specified as recurrent: Secondary | ICD-10-CM | POA: Diagnosis not present

## 2024-02-20 DIAGNOSIS — Z7982 Long term (current) use of aspirin: Secondary | ICD-10-CM | POA: Diagnosis not present

## 2024-02-20 DIAGNOSIS — N433 Hydrocele, unspecified: Secondary | ICD-10-CM | POA: Diagnosis not present

## 2024-02-20 DIAGNOSIS — Z79899 Other long term (current) drug therapy: Secondary | ICD-10-CM | POA: Diagnosis not present

## 2024-02-20 DIAGNOSIS — N5082 Scrotal pain: Secondary | ICD-10-CM | POA: Diagnosis not present

## 2024-02-20 DIAGNOSIS — I700 Atherosclerosis of aorta: Secondary | ICD-10-CM | POA: Diagnosis not present

## 2024-02-20 DIAGNOSIS — R1030 Lower abdominal pain, unspecified: Secondary | ICD-10-CM | POA: Diagnosis not present

## 2024-02-20 MED ORDER — IOHEXOL 300 MG/ML  SOLN
100.0000 mL | Freq: Once | INTRAMUSCULAR | Status: AC | PRN
  Administered 2024-02-20: 100 mL via INTRAVENOUS

## 2024-02-20 MED ORDER — LEVOFLOXACIN 500 MG PO TABS: 500.0000 mg | ORAL_TABLET | Freq: Every day | ORAL | 0 refills | Status: DC

## 2024-02-20 NOTE — Discharge Instructions (Signed)
 The evaluation in the ER reveals enlarged prostate and ultimately likely orchitis or epididymitis.  Please read the instructions provided on this.  Take the antibiotics that are prescribed.  Take ibuprofen  600 mg every 6-8 hours or Tylenol  500 mg every 4-6 hours for pain.  Applying ice might also help.

## 2024-02-20 NOTE — ED Triage Notes (Signed)
 Pt came in POV w/ c/o of  L groin pain x 3 days. Pt states pain gets worse at night but feels fine during the day. States Ibuprofen  does not work. HX of hernia. No other symptoms reported.

## 2024-02-20 NOTE — ED Provider Notes (Signed)
  EMERGENCY DEPARTMENT AT [facility] Provider Note   CSN: [id number] Arrival date & time: 02/20/24  9486     Patient presents with: Groin Pain   Richard Roach is a 68 y.o. male.   HPI     68 year old male comes in with chief complaint of lower quadrant abdominal discomfort.  Patient states that over the last 2 to 3 days, he has been having some discomfort over the left groin region and the pain radiates towards the scrotum and the left thigh.  Patient has no history of similar symptoms in the past.  He denies any burning with urination, blood in the urine.  Patient denies any flank pain, back pain.  He was seen at [facility] few months ago, had a CT scan that showed ileus at that time.  However, he has had normal bowel movements.  Patient denies any fevers, chills, nausea, vomiting.  No history of abdominal hernia.  Pain is typically worse at nighttime.  Prior to Admission medications   Medication Sig Start Date End Date Taking? Authorizing Provider  levofloxacin  (LEVAQUIN ) 500 MG tablet Take 1 tablet (500 mg total) by mouth daily. 02/20/24  Yes Charlyn Sora, MD  Ascorbic Acid (VITAMIN C PO) Take 282 mg by mouth daily. Gummy    [provider]  aspirin  EC 81 MG tablet Take 1 tablet (81 mg total) by mouth daily. 10/06/19   O'NealDarryle Ned, MD  Cholecalciferol (VITAMIN D3) 50 MCG (2000 UT) capsule Take 2,000 Units by mouth daily.    [provider]  cyanocobalamin  (VITAMIN B12) 1000 MCG tablet Take 1,000 mcg by mouth daily. Gummy    [provider]  Evolocumab  (REPATHA  SURECLICK) 140 MG/ML SOAJ INJECT 140 MG UNDER THE SKIN EVERY TWO WEEKS INTO THE THIGH, STOMACH, OR UPPER ARM 12/13/23   O'Neal, Darryle Ned, MD  metoprolol  succinate (TOPROL -XL) 25 MG 24 hr tablet TAKE ONE TABLET BY MOUTH AT BEDTIME 02/04/24   O'Neal, Darryle Ned, MD  Multiple Vitamin (MULTIVITAMIN ADULT PO) Take 1 tablet by mouth daily. One a day gummy    [provider]  predniSONE  (DELTASONE ) 10 MG tablet 3 tabs by mouth per day for 3 days,2tabs per day for 3 days,1tab per day for 3 days Patient not taking: Reported on 02/04/2024 01/14/24   Norleen Lynwood ORN, MD  rosuvastatin  (CRESTOR ) 40 MG tablet TAKE ONE TABLET BY MOUTH ONE TIME DAILY 12/10/23   Norleen Lynwood ORN, MD  tadalafil  (CIALIS ) 5 MG tablet Take 1 tablet (5 mg total) by mouth daily. Patient taking differently: Take 5 mg by mouth as needed. 11/19/22   Norleen Lynwood ORN, MD  triamcinolone  cream (KENALOG ) 0.1 % Apply 1 Application topically 2 (two) times daily. 01/14/24 01/13/25  Norleen Lynwood ORN, MD    Allergies: Patient has no known allergies.    Review of Systems  All other systems reviewed and are negative.   Updated Vital Signs BP (!) 148/85 (BP Location: Right Arm)   Pulse 68   Temp 98.5 F (36.9 C) (Oral)   Resp 16   SpO2 99%   Physical Exam Vitals and nursing note reviewed.  Constitutional:      Appearance: He is well-developed.  HENT:     Head: Atraumatic.  Eyes:     Extraocular Movements: Extraocular movements intact.     Pupils: Pupils are equal, round, and reactive to light.  Cardiovascular:     Rate and Rhythm: Normal rate.  Pulmonary:  Effort: Pulmonary effort is normal.  Abdominal:     Hernia: No hernia is present.     Comments: No abdominal tenderness including in the left lower quadrant.  Patient primarily has tenderness in the groin area.  Genitourinary:    Comments: No inguinal hernia appreciated. Scrotum is not significantly inflamed.  There is mild irritation with evaluation of the testicle, but the testicle is free moving Musculoskeletal:     Cervical back: Neck supple.  Skin:    General: Skin is warm.  Neurological:     Mental Status: He is alert and oriented to person, place, and time.     (all labs ordered are listed, but only abnormal results are displayed) Labs Reviewed  URINE CULTURE    EKG: None  Radiology: CT PELVIS W CONTRAST Result Date:  02/20/2024 CLINICAL DATA:  L groin pain. eval for hernia EXAM: CT PELVIS WITH CONTRAST TECHNIQUE: Multidetector CT imaging of the pelvis was performed using the standard protocol following the bolus administration of intravenous contrast. RADIATION DOSE REDUCTION: This exam was performed according to the departmental dose-optimization program which includes automated exposure control, adjustment of the mA and/or kV according to patient size and/or use of iterative reconstruction technique. CONTRAST:  OMNIPAQUE  IOHEXOL  300 MG/ML  SOLN COMPARISON:  July 25, 2020 FINDINGS: Urinary Tract:  Mild circumferential wall prominence of the bladder Bowel: Unremarkable visualized pelvic bowel loops. Appendix is normal. Vascular/Lymphatic: Atherosclerotic calcifications of the aorta. No suspicious lymphadenopathy. Reproductive: Prostatomegaly spanning 5.3 cm. Hydroceles, better assessed on same day ultrasound Other:  No significant inguinal hernia. Musculoskeletal: Degenerative changes of bilateral hips with subcortical cyst formation along predominately bilateral acetabula and osteophyte formation along the RIGHT greater than LEFT femoral head. No acute fracture. IMPRESSION: 1. No significant inguinal hernia. 2. Mild circumferential wall prominence of the bladder. This could be due to chronic outlet obstruction in the setting of prostatomegaly. Recommend correlation with urinalysis to exclude cystitis Aortic Atherosclerosis (ICD10-I70.0). Electronically Signed   By: Corean Salter M.D.   On: 02/20/2024 09:23   US  SCROTUM W/DOPPLER Result Date: 02/20/2024 EXAM: ULTRASOUND SCROTUM/TESTICLES WITH DOPPLER FLOW EVALUATION 02/20/2024 08:33:05 AM TECHNIQUE: Duplex ultrasound using B-mode/gray scaled imaging, Doppler spectral analysis and color flow Doppler was obtained of the testicles. COMPARISON: 07/14/2014 CLINICAL HISTORY: left scrotal pain. FINDINGS: RIGHT: MEASUREMENTS: The right testis measures 4.4 x 2.8 x 2.9  cm. GREY SCALE: The right testicle demonstrates a peripheral region of hypoechogenicity which measures roughly 1.7 x 0.5 cm and is new from the previous exam. This area may reflect sequelae of prior inflammation/infection, infarct or underline benign or malignant neoplasm. No testicular microlithiasis. DOPPLER EVALUATION: The peripheral hypoechoic region is relatively hypovascular. Normal color doppler flow with normal arterial and venous waveforms noted within the remainder of the testis VARICOCELE: No scrotal varicocele. SCROTAL SAC: Right hydrocele, greater than left. EPIDIDYMIS: No acute abnormality. LEFT: MEASUREMENTS: The left testis measures 4 x 2.5 x 3.0 cm. GREY SCALE: The left testicle demonstrates normal homogeneous echotexture without focal lesion. No testicular microlithiasis. Appendix testis is noted measuring 3 x 3 x 2 mm. DOPPLER EVALUATION: There is normal arterial and venous Doppler flow within the testicle. VARICOCELE: No scrotal varicocele. SCROTAL SAC: Left hydrocele, less than right. There are diffuse low-level internal echoes within the left hydrocele. EPIDIDYMIS: No acute abnormality. Small fat-containing left inguinal hernia noted. IMPRESSION: 1. No signs of testicular torsion 2. Bilateral hydroceles. The left hydrocele is complicated by diffuse internal echoes, which may reflect underlying orchitis/epididymitis. 3. New peripheral  hypoechoic region in the right testis, relatively hypovascular, which may represent sequelae of prior inflammation/infection, infarct, or neoplasm. Electronically signed by: Waddell Calk MD 02/20/2024 08:51 AM EDT RP Workstation: [id number]     Procedures   Medications Ordered in the ED  iohexol  (OMNIPAQUE ) 300 MG/ML solution 100 mL (100 mLs Intravenous Contrast Given 02/20/24 0913)                                    Medical Decision Making Amount and/or Complexity of Data Reviewed Labs: ordered. Radiology: ordered.  Risk Prescription drug  management.   68 year old male comes in with chief complaint of left-sided groin pain.  On exam, patient has no abdominal tenderness.  His tenderness is mostly in the groin area.  There is mild discomfort over the left scrotum.  Differential diagnosis for this patient includes prostatitis, hydrocele, epididymitis, kidney stone, hernia.  CT scan ordered, it is negative.  Ultrasound ordered and it shows hydrocele and likely epididymal orchitis.  Results of the ER workup discussed with the patient.  Will start him on Levaquin  for 10 days. Urine culture has been sent.  I have reviewed patient's records from recent ER visit.  His UA was normal, CBC, metabolic profile was normal at that time.   Final diagnoses:  Orchitis and epididymitis    ED Discharge Orders          Ordered    levofloxacin  (LEVAQUIN ) 500 MG tablet  Daily        02/20/24 1055               Charlyn Sora, MD 02/20/24 1154

## 2024-02-20 NOTE — ED Notes (Signed)
 Urine sent to lab

## 2024-02-21 LAB — URINE CULTURE: Culture: <10000 — AB

## 2024-02-25 DIAGNOSIS — N5201 Erectile dysfunction due to arterial insufficiency: Secondary | ICD-10-CM | POA: Diagnosis not present

## 2024-02-25 DIAGNOSIS — R399 Unspecified symptoms and signs involving the genitourinary system: Secondary | ICD-10-CM | POA: Diagnosis not present

## 2024-02-25 DIAGNOSIS — R351 Nocturia: Secondary | ICD-10-CM | POA: Diagnosis not present

## 2024-02-25 DIAGNOSIS — N453 Epididymo-orchitis: Secondary | ICD-10-CM | POA: Diagnosis not present

## 2024-02-25 DIAGNOSIS — N401 Enlarged prostate with lower urinary tract symptoms: Secondary | ICD-10-CM | POA: Diagnosis not present

## 2024-03-12 ENCOUNTER — Other Ambulatory Visit: Payer: Self-pay

## 2024-03-12 NOTE — Progress Notes (Signed)
 Contacted patient to discuss potential medication access barriers related to use of non-preferred pharmacy. Discussed health plan preferred pharmacies.  Patient has enrolled in [facility], so he will no longer be insured through [employer]. No further action required.   Woodie Jock, PharmD PGY1 Pharmacy Resident  03/12/2024

## 2024-03-16 ENCOUNTER — Telehealth: Payer: Self-pay

## 2024-03-16 NOTE — Telephone Encounter (Signed)
 Copied from CRM [id number]. Topic: Appointments - Scheduling Inquiry for Clinic >> Mar 16, 2024 11:21 AM Tinnie BROCKS wrote: Reason for CRM: Richard Roach is calling for him and his wife Richard Roach MRN: [id number]). They heard Dr. Norleen is retiring and they prefer to be transferred to Dr. Glade Hope to continue care. Would Dr. Hope accept any transfer of care patients from Dr. Norleen? Please give them a call to let them know at [phone]

## 2024-03-20 NOTE — Telephone Encounter (Signed)
 Yes, I will except

## 2024-04-04 ENCOUNTER — Ambulatory Visit
Admission: RE | Admit: 2024-04-04 | Discharge: 2024-04-04 | Disposition: A | Discharge status: Home / Self Care | Attending: Emergency Medicine | Admitting: Emergency Medicine

## 2024-04-04 ENCOUNTER — Other Ambulatory Visit: Payer: Self-pay

## 2024-04-04 VITALS — BP 135/77 | HR 62 | Temp 97.6°F | Resp 17 | Ht 70.0 in | Wt 185.0 lb

## 2024-04-04 DIAGNOSIS — J019 Acute sinusitis, unspecified: Secondary | ICD-10-CM | POA: Diagnosis not present

## 2024-04-04 DIAGNOSIS — B9689 Other specified bacterial agents as the cause of diseases classified elsewhere: Secondary | ICD-10-CM

## 2024-04-04 MED ORDER — AMOXICILLIN-POT CLAVULANATE 875-125 MG PO TABS: 1.0000 | ORAL_TABLET | Freq: Two times a day (BID) | ORAL | 0 refills | Status: DC

## 2024-04-04 NOTE — Discharge Instructions (Addendum)
 Your symptoms are consistent with a bacterial sinus infection.  Take the Augmentin  twice daily with food for the next 7 days.  Continue with your Flonase and Nettie pot.  You can try over-the-counter Mucinex as well.  Ensure you are drinking at least 64 ounces of water  daily to help loosen secretions.  Symptoms should improve with antibiotics, no improvement or any changes return to clinic for reevaluation.

## 2024-04-04 NOTE — ED Triage Notes (Signed)
 Pt presents with a chief complaint of head congestion x 8 days. States he has been using Flonase nasal spray, Advil  Cold & Sinus, and Nyquil at home with little improvement. No pain at this time. Denies sick contacts. No fevers.

## 2024-04-04 NOTE — ED Provider Notes (Signed)
 [facility]    CSN: [id number] Arrival date & time: 04/04/24  1102      History   Chief Complaint Chief Complaint  Patient presents with   Head Congestion    HPI Richard Roach is a 68 y.o. male.   Patient presents to clinic over concern of ongoing nasal congestion, rhinorrhea and sinus pressure. Has had post nasal drip.  Symptoms have been present for the past 8 or 9 days.  At home he has been using a Flonase nasal spray, Advil  Cold and Sinus, NyQuil and a Nettie pot without much improvement.  Feels okay throughout the day but at night the congestion is worse.  It is a yellow-green color.  Has not had fever.    The history is provided by the patient and medical records.    Past Medical History:  Diagnosis Date   Allergic rhinitis    Anxiety state 05/02/2015   Chronic RLQ pain 05/02/2015   Colon polyps    Coronary artery disease    Fatty liver    GERD (gastroesophageal reflux disease)    Hypercholesterolemia    Hypertension    Inguinal hernia    Internal hemorrhoids    Liver fibrosis 05/02/2015   PVC's (premature ventricular contractions)    Serrated adenoma of colon    Sinus bradycardia     Patient Active Problem List   Diagnosis Date Noted   Allergic rash present on examination 01/14/2024   Sinusitis 03/17/2023   Insomnia 03/17/2023   History of adenomatous polyp of colon 12/31/2022   Other fatigue 11/22/2022   Vitamin D  deficiency 09/25/2021   Coronary artery calcification seen on CT scan 09/19/2020   Right groin pain 07/11/2020   Nasal sore 01/09/2019   Right-sided chest pain 12/14/2017   Groin lump 09/09/2017   Hyperglycemia 04/28/2017   Chest pain 04/28/2017   Cough 03/11/2017   Abnormal breath sounds 03/11/2017   Upper respiratory tract infection 01/05/2017   Erectile dysfunction 04/24/2016   Hypercholesterolemia 02/06/2016   Anxiety state 05/02/2015   Liver fibrosis 05/02/2015   Chronic RLQ pain 05/02/2015   Benign prostatic  hyperplasia 05/02/2015   Colon polyps    Serrated adenoma of colon    PVC's (premature ventricular contractions)    Fatty liver 03/14/2014   Encounter for well adult exam with abnormal findings 04/25/2013   Palpitations 04/17/2013   Impacted cerumen of right ear 09/24/2012   Nontraumatic hematoma of penis 04/29/2012   Spermatocele 04/29/2012   Abdominal pain 12/06/2010   HYPERHIDROSIS 04/30/2009   Essential hypertension 06/09/2007   Allergic rhinitis 06/09/2007    Past Surgical History:  Procedure Laterality Date   COLONOSCOPY     KNEE ARTHROSCOPY Right 10/09/2009   KNEE ARTHROSCOPY Left 05/11/2010   LEFT HEART CATH AND CORONARY ANGIOGRAPHY N/A 01/05/2024   Procedure: LEFT HEART CATH AND CORONARY ANGIOGRAPHY;  Surgeon: Verlin Lonni BIRCH, MD;  Location: [facility] INVASIVE CV LAB;  Service: Cardiovascular;  Laterality: N/A;   TONSILLECTOMY         Home Medications    Prior to Admission medications   Medication Sig Start Date End Date Taking? Authorizing Provider  amoxicillin -clavulanate (AUGMENTIN ) 875-125 MG tablet Take 1 tablet by mouth every 12 (twelve) hours. 04/04/24  Yes [name]  N, FNP  Ascorbic Acid (VITAMIN C PO) Take 282 mg by mouth daily. Gummy    [provider]  aspirin  EC 81 MG tablet Take 1 tablet (81 mg total) by mouth daily. 10/06/19   Barbaraann Kotyk  Debby, MD  Cholecalciferol (VITAMIN D3) 50 MCG (2000 UT) capsule Take 2,000 Units by mouth daily.    [provider]  cyanocobalamin  (VITAMIN B12) 1000 MCG tablet Take 1,000 mcg by mouth daily. Gummy    [provider]  Evolocumab  (REPATHA  SURECLICK) 140 MG/ML SOAJ INJECT 140 MG UNDER THE SKIN EVERY TWO WEEKS INTO THE THIGH, STOMACH, OR UPPER ARM 12/13/23   O'Neal, Darryle Debby, MD  levofloxacin  (LEVAQUIN ) 500 MG tablet Take 1 tablet (500 mg total) by mouth daily. 02/20/24   Charlyn Sora, MD  metoprolol  succinate (TOPROL -XL) 25 MG 24 hr tablet TAKE ONE TABLET BY MOUTH AT BEDTIME  02/04/24   O'Neal, Darryle Debby, MD  Multiple Vitamin (MULTIVITAMIN ADULT PO) Take 1 tablet by mouth daily. One a day gummy    [provider]  predniSONE  (DELTASONE ) 10 MG tablet 3 tabs by mouth per day for 3 days,2tabs per day for 3 days,1tab per day for 3 days Patient not taking: Reported on 02/04/2024 01/14/24   Norleen Lynwood ORN, MD  rosuvastatin  (CRESTOR ) 40 MG tablet TAKE ONE TABLET BY MOUTH ONE TIME DAILY 12/10/23   Norleen Lynwood ORN, MD  tadalafil  (CIALIS ) 5 MG tablet Take 1 tablet (5 mg total) by mouth daily. Patient taking differently: Take 5 mg by mouth as needed. 11/19/22   Norleen Lynwood ORN, MD  triamcinolone  cream (KENALOG ) 0.1 % Apply 1 Application topically 2 (two) times daily. 01/14/24 01/13/25  Norleen Lynwood ORN, MD    Family History Family History  Problem Relation Age of Onset   High blood pressure Mother    Asthma Father    Colon cancer Neg Hx    Colon polyps Neg Hx    Diabetes Neg Hx    Kidney disease Neg Hx    Esophageal cancer Neg Hx    Liver cancer Neg Hx    Rectal cancer Neg Hx    Stomach cancer Neg Hx     Social History Social History   Tobacco Use   Smoking status: Former    Current packs/day: 0.00    Types: Cigarettes    Quit date: 05/11/1998    Years since quitting: 25.9   Smokeless tobacco: Never   Tobacco comments:    smoked only socially x 5 yrs  Vaping Use   Vaping status: Never Used  Substance Use Topics   Alcohol use: Yes    Alcohol/week: 14.0 standard drinks of alcohol    Types: 14 Glasses of wine per week   Drug use: No     Allergies   Patient has no known allergies.   Review of Systems Review of Systems  Per HPI  Physical Exam Triage Vital Signs ED Triage Vitals  Encounter Vitals Group     BP 04/04/24 1136 135/77     Girls Systolic BP Percentile --      Girls Diastolic BP Percentile --      Boys Systolic BP Percentile --      Boys Diastolic BP Percentile --      Pulse Rate 04/04/24 1136 62     Resp 04/04/24 1136 17     Temp  04/04/24 1136 97.6 F (36.4 C)     Temp Source 04/04/24 1136 Oral     SpO2 04/04/24 1136 95 %     Weight 04/04/24 1135 185 lb (83.9 kg)     Height 04/04/24 1135 5' 10 (1.778 m)     Head Circumference --      Peak Flow --  Pain Score 04/04/24 1135 0     Pain Loc --      Pain Education --      Exclude from Growth Chart --    No data found.  Updated Vital Signs BP 135/77 (BP Location: Right Arm)   Pulse 62   Temp 97.6 F (36.4 C) (Oral)   Resp 17   Ht 5' 10 (1.778 m)   Wt 185 lb (83.9 kg)   SpO2 95%   BMI 26.54 kg/m   Visual Acuity Right Eye Distance:   Left Eye Distance:   Bilateral Distance:    Right Eye Near:   Left Eye Near:    Bilateral Near:     Physical Exam Vitals and nursing note reviewed.  Constitutional:      Appearance: Normal appearance.  HENT:     Head: Normocephalic and atraumatic.     Right Ear: External ear normal.     Left Ear: External ear normal.     Nose: Congestion and rhinorrhea present.     Mouth/Throat:     Mouth: Mucous membranes are moist.     Pharynx: Posterior oropharyngeal erythema present.  Eyes:     Conjunctiva/sclera: Conjunctivae normal.  Cardiovascular:     Rate and Rhythm: Normal rate.  Pulmonary:     Effort: Pulmonary effort is normal. No respiratory distress.  Skin:    General: Skin is warm and dry.  Neurological:     General: No focal deficit present.     Mental Status: He is alert and oriented to person, place, and time.  Psychiatric:        Mood and Affect: Mood normal.        Behavior: Behavior normal.      UC Treatments / Results  Labs (all labs ordered are listed, but only abnormal results are displayed) Labs Reviewed - No data to display  EKG   Radiology No results found.  Procedures Procedures (including critical care time)  Medications Ordered in UC Medications - No data to display  Initial Impression / Assessment and Plan / UC Course  I have reviewed the triage vital signs and the  nursing notes.  Pertinent labs & imaging results that were available during my care of the patient were reviewed by me and considered in my medical decision making (see chart for details).  Vitals in triage reviewed, patient is hemodynamically stable.  Congestion, rhinorrhea and PND present on PE. Mild maxillary sinus tenderness.  Pressure does increase when patient bends forward.  Due to duration of symptoms will cover with Augmentin  for acute bacterial sinusitis.  Bilateral tympanic membranes are pearly gray bilaterally, low concern for otitis media.  Plan of care, follow-up care return precautions given, no questions at this time.    Final Clinical Impressions(s) / UC Diagnoses   Final diagnoses:  Acute bacterial sinusitis     Discharge Instructions      Your symptoms are consistent with a bacterial sinus infection.  Take the Augmentin  twice daily with food for the next 7 days.  Continue with your Flonase and Nettie pot.  You can try over-the-counter Mucinex as well.  Ensure you are drinking at least 64 ounces of water  daily to help loosen secretions.  Symptoms should improve with antibiotics, no improvement or any changes return to clinic for reevaluation.    ED Prescriptions     Medication Sig Dispense Auth. Provider   amoxicillin -clavulanate (AUGMENTIN ) 875-125 MG tablet Take 1 tablet by mouth every 12 (twelve) hours.  14 tablet Dreama, Ernan Runkles  N, FNP      PDMP not reviewed this encounter.   Dreama Lenor Provencher  N, FNP 04/04/24 1218

## 2024-04-12 DIAGNOSIS — N5203 Combined arterial insufficiency and corporo-venous occlusive erectile dysfunction: Secondary | ICD-10-CM | POA: Diagnosis not present

## 2024-04-12 DIAGNOSIS — N401 Enlarged prostate with lower urinary tract symptoms: Secondary | ICD-10-CM | POA: Diagnosis not present

## 2024-04-12 DIAGNOSIS — N453 Epididymo-orchitis: Secondary | ICD-10-CM | POA: Diagnosis not present

## 2024-04-24 ENCOUNTER — Telehealth: Payer: Self-pay

## 2024-04-24 DIAGNOSIS — I10 Essential (primary) hypertension: Secondary | ICD-10-CM

## 2024-04-24 DIAGNOSIS — E7800 Pure hypercholesterolemia, unspecified: Secondary | ICD-10-CM

## 2024-04-24 DIAGNOSIS — R739 Hyperglycemia, unspecified: Secondary | ICD-10-CM

## 2024-04-24 DIAGNOSIS — E559 Vitamin D deficiency, unspecified: Secondary | ICD-10-CM

## 2024-04-24 NOTE — Addendum Note (Signed)
 Addended by: NORLEEN LYNWOOD ORN on: 04/24/2024 02:42 PM   Modules accepted: Orders

## 2024-04-24 NOTE — Telephone Encounter (Signed)
 Copied from CRM #[id number]. Topic: Clinical - Request for Lab/Test Order >> Apr 24, 2024 10:51 AM Antony RAMAN wrote: Reason for CRM: wanting labs for physcial

## 2024-04-24 NOTE — Telephone Encounter (Signed)
Ok sure labs are ordered

## 2024-05-18 ENCOUNTER — Encounter: Payer: Self-pay | Admitting: Internal Medicine

## 2024-05-18 NOTE — Patient Instructions (Addendum)
" ° °  It was nice to meet    Medications changes include :   None      Return for yearly physical exam.  "

## 2024-05-18 NOTE — Progress Notes (Signed)
 "     Subjective:    Patient ID: Richard Roach, male    DOB: 1956/01/28, 69 y.o.   MRN: [id number]     HPI Richard Roach is here for follow up of his chronic medical problems.  He is here to establish with a new pcp.     He has no concerns.  He is exercising regularly.  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  He is not a vegetarian and is currently taking B12.  He is a prediabetic.  He denies back pain.  Medications and allergies reviewed with patient and updated if appropriate.  Medications Ordered Prior to Encounter[1]   Review of Systems  Respiratory:  Negative for cough, shortness of breath and wheezing.   Cardiovascular:  Negative for chest pain, palpitations and leg swelling.  Gastrointestinal:  Negative for abdominal pain.       Occ gerd  Neurological:  Negative for dizziness, light-headedness and headaches.  Psychiatric/Behavioral:  Negative for dysphoric mood. The patient is nervous/anxious (presentating in front of people).        Objective:   Vitals:   05/19/24 1118  BP: 128/80  Pulse: 89  Temp: 97.9 F (36.6 C)  SpO2: 98%   BP Readings from Last 3 Encounters:  05/19/24 128/80  04/04/24 135/77  02/20/24 (!) 148/85   Wt Readings from Last 3 Encounters:  05/19/24 184 lb (83.5 kg)  04/04/24 185 lb (83.9 kg)  01/14/24 180 lb (81.6 kg)   Body mass index is 26.4 kg/m.    Physical Exam Constitutional:      General: He is not in acute distress.    Appearance: Normal appearance. He is not ill-appearing.  HENT:     Head: Normocephalic and atraumatic.  Eyes:     Conjunctiva/sclera: Conjunctivae normal.  Cardiovascular:     Rate and Rhythm: Normal rate and regular rhythm.     Heart sounds: Normal heart sounds.  Pulmonary:     Effort: Pulmonary effort is normal. No respiratory distress.     Breath sounds: Normal breath sounds. No wheezing or rales.  Musculoskeletal:     Right lower leg: No edema.     Left lower leg: No  edema.  Skin:    General: Skin is warm and dry.     Findings: No rash.  Neurological:     Mental Status: He is alert. Mental status is at baseline.  Psychiatric:        Mood and Affect: Mood normal.        Lab Results  Component Value Date   WBC 8.4 02/18/2024   HGB 16.2 02/18/2024   HCT 46.6 02/18/2024   PLT 217 02/18/2024   GLUCOSE 122 (H) 02/18/2024   CHOL 112 11/17/2023   TRIG 41.0 11/17/2023   HDL 69.40 11/17/2023   LDLDIRECT 164.0 04/18/2012   LDLCALC 34 11/17/2023   ALT 16 02/18/2024   AST 19 02/18/2024   NA 136 02/18/2024   K 3.8 02/18/2024   CL 101 02/18/2024   CREATININE 1.15 02/18/2024   BUN 18 02/18/2024   CO2 25 02/18/2024   TSH 1.33 11/17/2023   PSA 2.35 11/17/2023   HGBA1C 5.9 11/17/2023     Assessment & Plan:    See Problem List for Assessment and Plan of chronic medical problems.       [1]  Current Outpatient Medications on File Prior to Visit  Medication Sig Dispense Refill   Ascorbic Acid (VITAMIN  C PO) Take 282 mg by mouth daily. Gummy     aspirin  EC 81 MG tablet Take 1 tablet (81 mg total) by mouth daily. 90 tablet 3   Cholecalciferol (VITAMIN D3) 50 MCG (2000 UT) capsule Take 2,000 Units by mouth daily.     cyanocobalamin  (VITAMIN B12) 1000 MCG tablet Take 1,000 mcg by mouth daily. Gummy     Evolocumab  (REPATHA  SURECLICK) 140 MG/ML SOAJ INJECT 140 MG UNDER THE SKIN EVERY TWO WEEKS INTO THE THIGH, STOMACH, OR UPPER ARM 6 mL 3   metoprolol  succinate (TOPROL -XL) 25 MG 24 hr tablet TAKE ONE TABLET BY MOUTH AT BEDTIME 90 tablet 2   Multiple Vitamin (MULTIVITAMIN ADULT PO) Take 1 tablet by mouth daily. One a day gummy     rosuvastatin  (CRESTOR ) 40 MG tablet TAKE ONE TABLET BY MOUTH ONE TIME DAILY 90 tablet 3   tadalafil  (CIALIS ) 5 MG tablet Take 1 tablet (5 mg total) by mouth daily. (Patient taking differently: Take 5 mg by mouth as needed.) 90 tablet 3   triamcinolone  cream (KENALOG ) 0.1 % Apply 1 Application topically 2 (two) times daily. 30  g 1   No current facility-administered medications on file prior to visit.   "

## 2024-05-19 ENCOUNTER — Ambulatory Visit: Admitting: Internal Medicine

## 2024-05-19 VITALS — BP 128/80 | HR 89 | Temp 97.9°F | Ht 70.0 in | Wt 184.0 lb

## 2024-05-19 DIAGNOSIS — R7303 Prediabetes: Secondary | ICD-10-CM | POA: Diagnosis not present

## 2024-05-19 DIAGNOSIS — N401 Enlarged prostate with lower urinary tract symptoms: Secondary | ICD-10-CM

## 2024-05-19 DIAGNOSIS — I2510 Atherosclerotic heart disease of native coronary artery without angina pectoris: Secondary | ICD-10-CM

## 2024-05-19 DIAGNOSIS — E7800 Pure hypercholesterolemia, unspecified: Secondary | ICD-10-CM

## 2024-05-19 DIAGNOSIS — R3912 Poor urinary stream: Secondary | ICD-10-CM | POA: Diagnosis not present

## 2024-05-19 DIAGNOSIS — F419 Anxiety disorder, unspecified: Secondary | ICD-10-CM

## 2024-05-19 DIAGNOSIS — I10 Essential (primary) hypertension: Secondary | ICD-10-CM | POA: Diagnosis not present

## 2024-05-19 DIAGNOSIS — N529 Male erectile dysfunction, unspecified: Secondary | ICD-10-CM

## 2024-05-19 DIAGNOSIS — E559 Vitamin D deficiency, unspecified: Secondary | ICD-10-CM | POA: Diagnosis not present

## 2024-05-19 NOTE — Assessment & Plan Note (Signed)
 Chronic With moderate CAD Lab Results  Component Value Date   LDLCALC 34 11/17/2023    Continue regular exercise and healthy diet  LDL at goal Continue Repatha  140 mg every 2 weeks, Crestor  40 mg daily

## 2024-05-19 NOTE — Assessment & Plan Note (Signed)
 Chronic Following with cardiology Moderate CAD, nonobstructive Cardiac catheterization showed moderate disease in LAD, mild disease elsewhere-no intervention Continue healthy diet, regular exercise Continue aspirin  81 mg daily, Repatha  140 mg every 2 weeks, metoprolol  XL 25 mg nightly, rosuvastatin  40 mg daily

## 2024-05-19 NOTE — Assessment & Plan Note (Signed)
 Chronic Lab Results  Component Value Date   HGBA1C 5.9 11/17/2023   He is cutting down on his sugar and trying to eat more healthy Continue regular exercise

## 2024-05-19 NOTE — Assessment & Plan Note (Signed)
Chronic Taking vitamin D supplementation

## 2024-05-19 NOTE — Assessment & Plan Note (Signed)
 Chronic Mild, situational Mostly with presentations Not currently on any medication

## 2024-05-19 NOTE — Assessment & Plan Note (Signed)
 Chronic Blood pressure well controlled Continue metoprolol  XL 25 mg nightly

## 2024-05-19 NOTE — Assessment & Plan Note (Signed)
Chronic Continue Cialis 5 mg daily as needed

## 2024-05-19 NOTE — Assessment & Plan Note (Signed)
 Chronic Seen on CT scan Cialis  5 mg daily, but has been taking as needed

## 2024-05-24 ENCOUNTER — Encounter: Payer: Self-pay | Admitting: *Deleted

## 2024-05-24 DIAGNOSIS — Z006 Encounter for examination for normal comparison and control in clinical research program: Secondary | ICD-10-CM

## 2024-05-24 NOTE — Research (Signed)
 Spoke with Richard Roach about pre-event research. He requested more information. Emailed him the consents to read over. Encouraged him to call or email me with any questions.

## 2024-05-29 ENCOUNTER — Other Ambulatory Visit (HOSPITAL_COMMUNITY): Payer: Self-pay

## 2024-05-29 ENCOUNTER — Telehealth: Payer: Self-pay | Admitting: Pharmacy Technician

## 2024-05-29 ENCOUNTER — Telehealth: Payer: Self-pay | Admitting: Cardiovascular Disease

## 2024-05-29 ENCOUNTER — Encounter: Payer: Self-pay | Admitting: Cardiovascular Disease

## 2024-05-29 NOTE — Telephone Encounter (Signed)
 Pharmacy Patient Advocate Encounter  Received notification from [employer] that Prior Authorization for repatha  has been APPROVED from 05/29/24 to 05/10/25   PA #/Case ID/Reference #: [id number]

## 2024-05-29 NOTE — Telephone Encounter (Signed)
 Pharmacy Patient Advocate Encounter   Received notification from [facility] that prior authorization for REPATHA  is required/requested.   Insurance verification completed.   The patient is insured through [employer].   Per test claim: PA required; PA submitted to above mentioned insurance via Latent Key/confirmation #/EOC [id number] Status is pending   Schuh JR, SALI

## 2024-05-29 NOTE — Telephone Encounter (Signed)
 Pt stopped by stating he needs refill on Repatha   Information/letter sent sent to fax to be uploaded by Leanne.

## 2024-05-30 NOTE — Telephone Encounter (Signed)
 Prior shara has been obtained. Please see additional phone encounter for more information.

## 2024-05-30 NOTE — Telephone Encounter (Signed)
 This request has been taken care of. Please additional phone encounter for more information.

## 2024-06-06 ENCOUNTER — Encounter: Payer: Self-pay | Admitting: *Deleted

## 2024-06-06 DIAGNOSIS — Z006 Encounter for examination for normal comparison and control in clinical research program: Secondary | ICD-10-CM

## 2024-06-06 NOTE — Research (Signed)
 Spoke with Richard Roach. He is interested  in coming in for pre-event Lpa screening. Scheduled for Jan 30-2026 at 0930.

## 2024-06-06 NOTE — Research (Signed)
 Message left for Richard Roach to see when he would like to schedule an appointment to come in for screening with LPa, pre-event.

## 2024-06-09 ENCOUNTER — Encounter: Admitting: *Deleted

## 2024-06-09 DIAGNOSIS — Z006 Encounter for examination for normal comparison and control in clinical research program: Secondary | ICD-10-CM

## 2024-06-09 NOTE — Research (Signed)
 Pre-event  INCLUSION  Yes No  Participant has provided written informed consent before initiation of any study-specific activities/procedures. [x]  []   Age >= 50 years at the time of signing of the Lp(a) screening ICF. [x]  []   Lp(a) >= 200 nmol/L during Lp(a) screening by a central laboratory using an investigational IVD test. At least 4 weeks of stable and optimized lipid-lowering therapy consistent with regional/local clinical practice guidelines or according to investigators judgment before Lp(a) screening.                                                                                                         _______________ Waiting on results    Participants meeting at least one of the following categories (A or B):  A. Multiple risk factors for atherosclerotic disease  1. Presence of >= 4 of any of the following ASCVD risk factors: a. Age >= 65 years men or women b. History of hypertension requiring pharmacotherapy c. Current cigarette tobacco smoking d. Diabetes mellitus (Type 1 or Type 2) requiring pharmacotherapy e. Screening high-sensitivity C-reactive protein (hs-CRP) >= 2.0 mg/L by central laboratory f. Screening estimated glomerular filtration rate (eGFR) 30 to < 60 mL/min/1.73 m2 by central laboratory g. Familial hypercholesterolemia  or family history of premature ASCVD (1st degree relative before age 44 years for males, and/or 1st degree relative before age 67 years for females)  AND/OR  B. History of atherosclerosis evidenced by:  1. Coronary Artery Disease-Reporting and Data System (CAD-RADS) P3, and/or 2. Coronary artery calcium  (CAC) > 300, and/or                          CAC  1369 3. Atherosclerosis defined by at least one of the following AND >= 2 additional risk factors listed in 104 A: a. Coronary atherosclerosis as evidenced by a stenosis >= 50% in at least one artery b. Coronary atherosclerosis as evidenced by a stenosis >= 25% (or reported as at least  mild) in at least 2 arteries detected on invasive or noninvasive imaging c. Coronary atherosclerosis as evidenced by a CAC score > 100 and/or CAD-RADS >= P2 d. Carotid atherosclerosis as evidenced by an internal carotid stenosis >= 50% e. Peripheral atherosclerosis as evidenced by >= 50% stenosis of limb artery and/or ankle brachial index < 0.9                                                                     Bold the above patient meets.  [x]  []   EXCLUSION Yes No  Prior acute atherothrombotic qualifying event at any time, defined as prior myocardial infarction, prior stroke, prior transient ischemic attack, or prior acute limb ischemia. []  [x]   Prior arterial revascularization at any time suspected to be associated with atherosclerosis. []  [x]   If available, participants  without known atherosclerosis, CAC score = 0, CAD-RADS = 0, in the last 10 years prior to enrollment. []  [x]   Severe renal dysfunction, defined as an eGFR < 30 mL/min/1.73 m2 by central laboratory during screening []  [x]   History of decompensated liver cirrhosis and/or screening aspartate aminotransferase (AST) or alanine aminotransferase (ALT) > 3 x upper limit of normal (ULN), or total bilirubin (TBL) > 2 x ULN (except in stable Gilberts syndrome). []  [x]   History of major bleeding disorder (eg, hemophilia, von Willebrand disease, clotting factor deficiencies, etc). []  [x]   Planned arterial revascularization (percutaneous or surgical). []  [x]   Fasting triglycerides > 400 mg/dL (5.47 mmol/L) during screening. []  [x]   Participant has known sensitivity to any of the products or components to be administered during dosing or the study procedures. []  [x]   Malignancy not in remission for at least 5 years prior to enrollment, exceptions for less than 5 years since remission include non-melanoma skin cancers, localized thyroid  cancer (papillary, follicular, and medullary), cervical in situ carcinoma, breast ductal carcinoma  in situ, or stage 1 prostate carcinoma. []  [x]   Diagnosis of severe heart failure (New York  Heart Association Functional Classification IV), and/or if available, most recent left ventricular ejection fraction < 30%. []  [x]   Uncontrolled or recurrent ventricular tachycardia in the past 3 months before enrollment. []  [x]   Atrial fibrillation or flutter and not on an anticoagulant despite an indication to be anticoagulated.    Uncontrolled hypertension at screening, defined as systolic blood pressure > 180 mmHg or diastolic blood pressure > 110 mmHg at rest despite antihypertensive therapy. []  [x]   Diabetes mellitus (Type 1 or Type 2) with a hemoglobin A1C (HbA1c) >= 10% by central laboratory at screening. []  [x]   History or evidence of any other clinically significant disorder, condition, or disease (such as active infection) that, in the opinion of the investigator or [employer] physician, if consulted, would pose a risk to participant safety, or interfere with the study evaluation, procedures or completion. []  [x]   Current or planned lipoprotein apheresis or < 3 months since last apheresis treatment before enrollment. []  [x]   Participant has taken a cholesteryl ester transfer protein inhibitor or lomitapide in the last 12 months before enrollment. []  [x]   Previously received any therapy specifically targeting Lp(a) including but not limited to, olpasiran, pelacarsen, lepodisiran, zerlasiran, or muvalaplin []  [x]    Currently receiving treatment in another investigational device or drug study, or less than 30 days since ending treatment on another investigational device or drug study(ies). Other investigational procedures while participating in this study are excluded. []  [x]   Participants of childbearing potential unwilling to use protocol-specified method of contraception during treatment and for an additional 30 days after the last dose of investigational product.  []  [x]   Participants who are  breastfeeding or who plan to breastfeed while on study through 30 days after the last dose of investigational product []  [x]   Participants planning to become pregnant while on study through 30 days after the last dose of investigational product.  []  [x]   Participants of childbearing potential with a positive pregnancy test assessed at screening and/or day 1 by a highly sensitive urine or serum pregnancy test. []  [x]   Participant likely to not be available to complete all protocol-required study visits or procedures, and/or to comply with all required study procedures (eg, Clinical Outcome Assessments) to the best of the Participant and investigators knowledge.                         []  [  x]

## 2024-06-09 NOTE — Research (Signed)
 Pre-Event  Informed Consent   Subject Name: Richard Roach  Subject met inclusion and exclusion criteria.  The informed consent form, study requirements and expectations were reviewed with the subject and questions and concerns were addressed prior to the signing of the consent form.  The subject verbalized understanding of the trial requirements.  The subject agreed to participate in the Pre-event  trial and signed the informed consent at 09:35  on 06-09-2024.  The informed consent was obtained prior to performance of any protocol-specific procedures for the subject.  A copy of the signed informed consent was given to the subject and a copy was placed in the subject's medical record.   Brittne Kawasaki   Pre-Event Lp(a) Screening Visit  Date of Visit:  06-09-2024                                            Subject Number:[id number]  During this visit the following activities were completed:  [x] Reading, Signing and Understanding the informed Consent for Lp(a) screening  [x] Demographics  [x] Medical History  [x] Adverse events  [] Serious adverse device effects  [x] Concomitant therapies reviewed  [x] Central labs      [x] Lp(a)   Pre-event  INCLUSION  Yes No  Participant has provided written informed consent before initiation of any study-specific activities/procedures. []  []   Age >= 50 years at the time of signing of the Lp(a) screening ICF. []  []   Lp(a) >= 200 nmol/L during Lp(a) screening by a central laboratory using an investigational IVD test. At least 4 weeks of stable and optimized lipid-lowering therapy consistent with regional/local clinical practice guidelines or according to investigators judgment before Lp(a) screening.                                                                                                         _______________    Participants meeting at least one of the following categories (A or B):  A. Multiple risk factors for atherosclerotic disease  1.  Presence of >= 4 of any of the following ASCVD risk factors: a. Age >= 65 years men or women b. History of hypertension requiring pharmacotherapy c. Current cigarette tobacco smoking d. Diabetes mellitus (Type 1 or Type 2) requiring pharmacotherapy e. Screening high-sensitivity C-reactive protein (hs-CRP) >= 2.0 mg/L by central laboratory f. Screening estimated glomerular filtration rate (eGFR) 30 to < 60 mL/min/1.73 m2 by central laboratory g. Familial hypercholesterolemia  or family history of premature ASCVD (1st degree relative before age 43 years for males, and/or 1st degree relative before age 51 years for females)  AND/OR  B. History of atherosclerosis evidenced by:  1. Coronary Artery Disease-Reporting and Data System (CAD-RADS) P3, and/or 2. Coronary artery calcium  (CAC) > 300, and/or                          CAC ________________ 3. Atherosclerosis defined by at least one of the following AND >= 2 additional  risk factors listed in 104 A: a. Coronary atherosclerosis as evidenced by a stenosis >= 50% in at least one artery b. Coronary atherosclerosis as evidenced by a stenosis >= 25% (or reported as at least mild) in at least 2 arteries detected on invasive or noninvasive imaging c. Coronary atherosclerosis as evidenced by a CAC score > 100 and/or CAD-RADS >= P2 d. Carotid atherosclerosis as evidenced by an internal carotid stenosis >= 50% e. Peripheral atherosclerosis as evidenced by >= 50% stenosis of limb artery and/or ankle brachial index < 0.9                                                                     Bold the above patient meets.  []  []   EXCLUSION Yes No  Prior acute atherothrombotic qualifying event at any time, defined as prior myocardial infarction, prior stroke, prior transient ischemic attack, or prior acute limb ischemia. []  []   Prior arterial revascularization at any time suspected to be associated with atherosclerosis. []  []   If available,  participants without known atherosclerosis, CAC score = 0, CAD-RADS = 0, in the last 10 years prior to enrollment. []  []   Severe renal dysfunction, defined as an eGFR < 30 mL/min/1.73 m2 by central laboratory during screening []  []   History of decompensated liver cirrhosis and/or screening aspartate aminotransferase (AST) or alanine aminotransferase (ALT) > 3 x upper limit of normal (ULN), or total bilirubin (TBL) > 2 x ULN (except in stable Gilberts syndrome). []  []   History of major bleeding disorder (eg, hemophilia, von Willebrand disease, clotting factor deficiencies, etc). []  []   Planned arterial revascularization (percutaneous or surgical). []  []   Fasting triglycerides > 400 mg/dL (5.47 mmol/L) during screening. []  []   Participant has known sensitivity to any of the products or components to be administered during dosing or the study procedures. []  []   Malignancy not in remission for at least 5 years prior to enrollment, exceptions for less than 5 years since remission include non-melanoma skin cancers, localized thyroid  cancer (papillary, follicular, and medullary), cervical in situ carcinoma, breast ductal carcinoma in situ, or stage 1 prostate carcinoma. []  []   Diagnosis of severe heart failure (New York  Heart Association Functional Classification IV), and/or if available, most recent left ventricular ejection fraction < 30%. []  []   Uncontrolled or recurrent ventricular tachycardia in the past 3 months before enrollment. []  []   Atrial fibrillation or flutter and not on an anticoagulant despite an indication to be anticoagulated.    Uncontrolled hypertension at screening, defined as systolic blood pressure > 180 mmHg or diastolic blood pressure > 110 mmHg at rest despite antihypertensive therapy. []  []   Diabetes mellitus (Type 1 or Type 2) with a hemoglobin A1C (HbA1c) >= 10% by central laboratory at screening. []  []   History or evidence of any other clinically significant disorder,  condition, or disease (such as active infection) that, in the opinion of the investigator or [employer] physician, if consulted, would pose a risk to participant safety, or interfere with the study evaluation, procedures or completion. []  []   Current or planned lipoprotein apheresis or < 3 months since last apheresis treatment before enrollment. []  []   Participant has taken a cholesteryl ester transfer protein inhibitor or lomitapide in the last 12 months before enrollment. []  []   Previously received any therapy specifically targeting Lp(a) including but not limited to, olpasiran, pelacarsen, lepodisiran, zerlasiran, or muvalaplin []  []    Currently receiving treatment in another investigational device or drug study, or less than 30 days since ending treatment on another investigational device or drug study(ies). Other investigational procedures while participating in this study are excluded. []  []   Participants of childbearing potential unwilling to use protocol-specified method of contraception during treatment and for an additional 30 days after the last dose of investigational product.  []  []   Participants who are breastfeeding or who plan to breastfeed while on study through 30 days after the last dose of investigational product []  []   Participants planning to become pregnant while on study through 30 days after the last dose of investigational product.  []  []   Participants of childbearing potential with a positive pregnancy test assessed at screening and/or day 1 by a highly sensitive urine or serum pregnancy test. []  []   Participant likely to not be available to complete all protocol-required study visits or procedures, and/or to comply with all required study procedures (eg, Clinical Outcome Assessments) to the best of the Participant and investigators knowledge.                         []  [] 

## 2024-06-15 ENCOUNTER — Encounter: Payer: Self-pay | Admitting: *Deleted

## 2024-06-15 DIAGNOSIS — Z006 Encounter for examination for normal comparison and control in clinical research program: Secondary | ICD-10-CM

## 2024-06-15 NOTE — Research (Signed)
 Message left for Richard Roach to call be back so we can schedule the main screening for pre-event.

## 2024-06-15 NOTE — Research (Signed)
 Spoke with Richard Roach scheduled main screening for Feb 13 at 0830.

## 2024-06-23 ENCOUNTER — Encounter

## 2024-11-17 ENCOUNTER — Encounter: Admitting: Internal Medicine

## 2024-11-29 ENCOUNTER — Encounter: Admitting: Internal Medicine
